# Patient Record
Sex: Female | Born: 1965
Health system: Southern US, Community
[De-identification: ages and names within clinical notes are randomized; demographics above are authoritative.]

## PROBLEM LIST (undated history)

## (undated) DIAGNOSIS — M1990 Unspecified osteoarthritis, unspecified site: Secondary | ICD-10-CM

## (undated) DIAGNOSIS — G44009 Cluster headache syndrome, unspecified, not intractable: Secondary | ICD-10-CM

## (undated) DIAGNOSIS — G4730 Sleep apnea, unspecified: Secondary | ICD-10-CM

## (undated) DIAGNOSIS — Z8719 Personal history of other diseases of the digestive system: Secondary | ICD-10-CM

## (undated) DIAGNOSIS — E119 Type 2 diabetes mellitus without complications: Secondary | ICD-10-CM

## (undated) DIAGNOSIS — M170 Bilateral primary osteoarthritis of knee: Secondary | ICD-10-CM

## (undated) DIAGNOSIS — R51 Headache: Secondary | ICD-10-CM

## (undated) DIAGNOSIS — G5603 Carpal tunnel syndrome, bilateral upper limbs: Secondary | ICD-10-CM

## (undated) DIAGNOSIS — IMO0001 Reserved for inherently not codable concepts without codable children: Secondary | ICD-10-CM

## (undated) DIAGNOSIS — F419 Anxiety disorder, unspecified: Secondary | ICD-10-CM

## (undated) DIAGNOSIS — F32A Depression, unspecified: Secondary | ICD-10-CM

## (undated) DIAGNOSIS — J45909 Unspecified asthma, uncomplicated: Secondary | ICD-10-CM

## (undated) DIAGNOSIS — Z87442 Personal history of urinary calculi: Secondary | ICD-10-CM

## (undated) DIAGNOSIS — M7540 Impingement syndrome of unspecified shoulder: Secondary | ICD-10-CM

## (undated) DIAGNOSIS — M5020 Other cervical disc displacement, unspecified cervical region: Secondary | ICD-10-CM

## (undated) DIAGNOSIS — K219 Gastro-esophageal reflux disease without esophagitis: Secondary | ICD-10-CM

## (undated) DIAGNOSIS — R600 Localized edema: Secondary | ICD-10-CM

## (undated) DIAGNOSIS — R609 Edema, unspecified: Secondary | ICD-10-CM

## (undated) DIAGNOSIS — F329 Major depressive disorder, single episode, unspecified: Secondary | ICD-10-CM

## (undated) DIAGNOSIS — N289 Disorder of kidney and ureter, unspecified: Secondary | ICD-10-CM

## (undated) DIAGNOSIS — N840 Polyp of corpus uteri: Secondary | ICD-10-CM

## (undated) DIAGNOSIS — M25819 Other specified joint disorders, unspecified shoulder: Secondary | ICD-10-CM

## (undated) DIAGNOSIS — I10 Essential (primary) hypertension: Secondary | ICD-10-CM

## (undated) DIAGNOSIS — J189 Pneumonia, unspecified organism: Secondary | ICD-10-CM

## (undated) HISTORY — PX: KNEE ARTHROSCOPY: SHX127

## (undated) HISTORY — PX: IRRIGATION AND DEBRIDEMENT KNEE: SHX5185

## (undated) HISTORY — PX: FOOT NEUROMA SURGERY: SHX646

## (undated) HISTORY — PX: TEAR DUCT PROBING: SHX793

## (undated) HISTORY — DX: Disorder of kidney and ureter, unspecified: N28.9

## (undated) HISTORY — PX: ANKLE ARTHROSCOPY: SHX545

---

## 1998-10-06 ENCOUNTER — Emergency Department (HOSPITAL_COMMUNITY): Admission: EM | Admit: 1998-10-06 | Discharge: 1998-10-06 | Payer: Self-pay | Admitting: Internal Medicine

## 1999-11-26 ENCOUNTER — Emergency Department (HOSPITAL_COMMUNITY): Admission: EM | Admit: 1999-11-26 | Discharge: 1999-11-26 | Payer: Self-pay | Admitting: Emergency Medicine

## 1999-11-26 ENCOUNTER — Encounter: Payer: Self-pay | Admitting: Emergency Medicine

## 2000-08-05 ENCOUNTER — Encounter: Payer: Self-pay | Admitting: Family Medicine

## 2000-08-05 ENCOUNTER — Ambulatory Visit (HOSPITAL_COMMUNITY): Admission: RE | Admit: 2000-08-05 | Discharge: 2000-08-05 | Payer: Self-pay | Admitting: Family Medicine

## 2002-03-03 ENCOUNTER — Emergency Department (HOSPITAL_COMMUNITY): Admission: EM | Admit: 2002-03-03 | Discharge: 2002-03-03 | Payer: Self-pay | Admitting: Emergency Medicine

## 2002-08-14 ENCOUNTER — Other Ambulatory Visit: Admission: RE | Admit: 2002-08-14 | Discharge: 2002-08-14 | Payer: Self-pay | Admitting: Obstetrics and Gynecology

## 2002-09-15 ENCOUNTER — Emergency Department (HOSPITAL_COMMUNITY): Admission: EM | Admit: 2002-09-15 | Discharge: 2002-09-15 | Payer: Self-pay | Admitting: Emergency Medicine

## 2002-09-15 ENCOUNTER — Encounter: Payer: Self-pay | Admitting: Emergency Medicine

## 2003-03-13 ENCOUNTER — Ambulatory Visit (HOSPITAL_COMMUNITY): Admission: RE | Admit: 2003-03-13 | Discharge: 2003-03-13 | Payer: Self-pay | Admitting: Family Medicine

## 2003-03-13 ENCOUNTER — Encounter: Payer: Self-pay | Admitting: Family Medicine

## 2003-07-31 ENCOUNTER — Ambulatory Visit (HOSPITAL_COMMUNITY): Admission: RE | Admit: 2003-07-31 | Discharge: 2003-07-31 | Payer: Self-pay | Admitting: Orthopedic Surgery

## 2003-08-24 ENCOUNTER — Emergency Department (HOSPITAL_COMMUNITY): Admission: EM | Admit: 2003-08-24 | Discharge: 2003-08-24 | Payer: Self-pay | Admitting: Emergency Medicine

## 2004-02-27 ENCOUNTER — Ambulatory Visit (HOSPITAL_COMMUNITY): Admission: RE | Admit: 2004-02-27 | Discharge: 2004-02-27 | Payer: Self-pay | Admitting: Family Medicine

## 2005-04-26 ENCOUNTER — Ambulatory Visit (HOSPITAL_BASED_OUTPATIENT_CLINIC_OR_DEPARTMENT_OTHER): Admission: RE | Admit: 2005-04-26 | Discharge: 2005-04-26 | Payer: Self-pay | Admitting: Family Medicine

## 2005-05-03 ENCOUNTER — Ambulatory Visit: Payer: Self-pay | Admitting: Internal Medicine

## 2005-07-13 ENCOUNTER — Ambulatory Visit (HOSPITAL_COMMUNITY): Admission: RE | Admit: 2005-07-13 | Discharge: 2005-07-13 | Payer: Self-pay | Admitting: Orthopedic Surgery

## 2005-07-13 ENCOUNTER — Encounter (INDEPENDENT_AMBULATORY_CARE_PROVIDER_SITE_OTHER): Payer: Self-pay | Admitting: Specialist

## 2005-08-25 ENCOUNTER — Ambulatory Visit: Payer: Self-pay | Admitting: Pulmonary Disease

## 2007-05-10 ENCOUNTER — Emergency Department (HOSPITAL_COMMUNITY): Admission: EM | Admit: 2007-05-10 | Discharge: 2007-05-10 | Payer: Self-pay | Admitting: Emergency Medicine

## 2007-08-16 ENCOUNTER — Ambulatory Visit (HOSPITAL_COMMUNITY): Admission: RE | Admit: 2007-08-16 | Discharge: 2007-08-16 | Payer: Self-pay | Admitting: Orthopedic Surgery

## 2007-08-31 ENCOUNTER — Inpatient Hospital Stay (HOSPITAL_COMMUNITY): Admission: AD | Admit: 2007-08-31 | Discharge: 2007-09-08 | Payer: Self-pay | Admitting: Orthopedic Surgery

## 2007-09-02 ENCOUNTER — Ambulatory Visit: Payer: Self-pay | Admitting: Infectious Diseases

## 2007-09-05 ENCOUNTER — Encounter (INDEPENDENT_AMBULATORY_CARE_PROVIDER_SITE_OTHER): Payer: Self-pay | Admitting: Orthopedic Surgery

## 2007-09-05 ENCOUNTER — Ambulatory Visit: Payer: Self-pay | Admitting: *Deleted

## 2009-01-28 ENCOUNTER — Encounter: Admission: RE | Admit: 2009-01-28 | Discharge: 2009-01-28 | Payer: Self-pay | Admitting: Family Medicine

## 2009-02-22 ENCOUNTER — Encounter: Admission: RE | Admit: 2009-02-22 | Discharge: 2009-02-22 | Payer: Self-pay | Admitting: Orthopedic Surgery

## 2010-06-23 ENCOUNTER — Ambulatory Visit: Payer: Self-pay | Admitting: Vascular Surgery

## 2010-06-24 ENCOUNTER — Ambulatory Visit: Payer: Self-pay | Admitting: Vascular Surgery

## 2011-02-24 NOTE — Procedures (Signed)
DUPLEX DEEP VENOUS EXAM - LOWER EXTREMITY   INDICATION:  Bilateral lower extremity leg pain.   HISTORY:  Edema:  Yes.  Trauma/Surgery:  No.  Pain:  Yes.  PE:  No.  Previous DVT:  No.  Anticoagulants:  No.  Other:   DUPLEX EXAM:                CFV   SFV   PopV  PTV    GSV                R  L  R  L  R  L  R   L  R  L  Thrombosis    o  o  o  o  o  o         o  o  Spontaneous   +  +  +  +  +  +         +  +  Phasic        +  +  +  +  +  +         +  +  Augmentation  +  +  +  +  +  +         +  +  Compressible  +  +  +  +  +  +         +  +  Competent     +  +  +  +  +  +         +  +   Legend:  + - yes  o - no  p - partial  D - decreased   IMPRESSION:  Bilateral lower extremity deep veins appear negative for  deep venous thrombosis; however, distal thigh and calf veins were unable  to be imaged due to patient body habitus.    _____________________________  Larina Earthly, M.D.   EM/MEDQ  D:  06/23/2010  T:  06/23/2010  Job:  [id number]

## 2011-02-24 NOTE — Op Note (Signed)
NAMESARYNA, [name]              ACCOUNT NO.:  [id number]   MEDICAL RECORD NO.:  [id number]          PATIENT TYPE:  INP   LOCATION:  1537                         FACILITY:  [facility]   PHYSICIAN:  Madlyn Frankel. Charlann Boxer, M.D.  DATE OF BIRTH:  Dec 22, 1965   DATE OF PROCEDURE:  09/05/2007  DATE OF DISCHARGE:                               OPERATIVE REPORT   PREOPERATIVE DIAGNOSES:  1. Infected left knee, status post arthroscopic irrigation and      debridement on August 31, 2007.  2. History of recent and left knee arthroscopy, complicated by      infection.   POSTOPERATIVE DIAGNOSES:  1. Infected left knee, status post arthroscopic irrigation and      debridement on August 31, 2007.  2. History of recent and left knee arthroscopy, complicated by      infection.   PROCEDURE:  Repeat arthroscopic irrigation and debridement of the left  knee.   SURGEON:  Madlyn Frankel. Charlann Boxer, M.D.   ASSISTANTS:  None.   ANESTHESIA:  General.   BLOOD LOSS:  Not applicable.   DRAINS:  x1.   COMPLICATIONS:  None.   INDICATIONS FOR PROCEDURE:  Holly [name] is a 45 year old female with history  of advanced bilaterally knee osteoarthritis with aggravated symptoms in  her left knee.  She underwent arthroscopic surgery approximately 2-1/2  weeks ago that was complicated by infection. Brought to the hospital on  August 31, 2007 for I&D.  At that time, she was noted to have a  significant amount of pus inside the knee.  Cultures at this point have  grown out MRSE.  Based on persistence and pain, there was quite a lot of  narcotic medication.  We felt that it was important for [place] to return to  the operating room for I&D with concern for recurrent infection.   Reviewed with her the risks and benefits.  She expressed her concern  about going back to the operating room.  Consent was obtained.   PROCEDURE IN DETAIL:  The patient was brought to operative theater.  Once adequate anesthesia was established, the patient  was positioned  supine.  The right leg was placed into a lithotomy leg holder.  The left  leg was put into a leg holder.  The left lower extremity prepped and  draped in sterile fashion.  Sutures removed from the inferior portal  sites.   Once arthroscopic systems were set up, the inferolateral portal was  opened and the arthroscope placed in.   I irrigated the knee with 6 liters of normal saline solution and  debrided some synovium.  Initial incision indicated a rush of either  purulent material versus necrotic fibrinous tissue.  However, given the  current situation, I have to feel that this was filled with purulence.   Following the I&D, the instrumentation was removed.  I placed a Hemovac  drain, and what I did was I basically left the two loops out to the  inferomedial and inferolateral portals and then packed the rest of the  drain inside the knee.  I then hooked this up to a single  canister.  The  portal sites were reapproximated using a 2-0 nylon.  I did leave 2-0  sutures open in it so I could close them down once I removed the drain.  Following this, the knee was dressed with Xeroform and a sterile bulky  Jones dressing.  She was brought to the recovery room in stable  condition, noting that cultures were not obtained based on the fact that  we already had MRSE grown and speciated.      Madlyn Frankel Charlann Boxer, M.D.  Electronically Signed     MDO/MEDQ  D:  09/05/2007  T:  09/06/2007  Job:  [id number]

## 2011-02-24 NOTE — Op Note (Signed)
NAMEDIVINE, IMBER              ACCOUNT NO.:  [id number]   MEDICAL RECORD NO.:  [id number]          PATIENT TYPE:  INP   LOCATION:  1537                         FACILITY:  [facility]   PHYSICIAN:  Madlyn Frankel. Charlann Boxer, M.D.  DATE OF BIRTH:  November 30, 1965   DATE OF PROCEDURE:  08/31/2007  DATE OF DISCHARGE:                               OPERATIVE REPORT   PREOPERATIVE DIAGNOSIS:  Superficial portal site infection versus deep  left knee infection, status post left knee arthroscopy performed on  August 16, 2007.   POSTOPERATIVE DIAGNOSIS:  Septic left knee.   OPERATION PERFORMED:  1. Incision and drainage of lateral portal site with skin and      subcutaneous debridement as well as pulse lavage irrigation.  2. Left knee arthroscopy with irrigation and debridement including a      tricompartmental synovectomy in addition to medial, lateral and      patellofemoral chondroplasty type debridement.  No meniscal surgery      carried out as this was previously done.   SURGEON:  Madlyn Frankel. Charlann Boxer, M.D.   ASSISTANT:  None.   ANESTHESIA:  General.   ESTIMATED BLOOD LOSS:  Minimal.   DRAINS:  x1 placed into the superomedial portal site.   INDICATIONS FOR PROCEDURE:  Holly [name] is a 45 year old morbidly obese  female with history of left knee arthroscopy on  August 16, 2007.  The  reason for that surgery was known advanced arthritis with recent flare  of pain that was unresponsive to treatment.  At the time of index  procedure, there was no obvious signs of infection and this was low my  differential diagnosis due to her presentation.  She was doing pretty  well at the first visit which was nine or 10 days out but had some  drainage early.  She was told to come back to the office if the drainage  persisted and by Wednesday she was in the office with persistent  drainage.  She did not report fevers, chills or night sweats.  She did  have this drainage, no reports of any warmth of the knee, no  large  effusions, just pain.  Upon evaluation of the knee in the office, there  was drainage.  There was no warmth of cellulitis.  This was purulent  drainage coming from the knee.  At this observation, she was admitted to  the hospital, and urgent I&D was scheduled. The risks, benefits,  discussion of treatment plan were all reviewed, consent was obtained  with hospitalization in mind.   DESCRIPTION OF PROCEDURE:  The patient was brought to the operative  theater.  Once adequate anesthesia was established, the patient was  positioned carefully on the operative table.  We did use a lithotomy  position for the right nonoperative leg making sure that all bony  prominences were padded holding the leg to the side.  The left leg was  placed in the leg holder .  The left lower extremity was prepped and  draped with DuraPrep in standard fashion.   At this point following time out to identify extremity and procedure,  the inferolateral portal was excised in elliptical fashion.  Sharp  dissection was carried down through the skin and subcutaneous fat  tissue.  There was obvious purulence at this point and I was able to  digitally penetrate the joint expressing a large amount of pus from the  joint.  Cultures were obtained at this time.  Antibiotics were initiated  with 1 g of vancomycin.   Following significant decompression of this joint, I changed gloves,  changed knife blades.  I then recreated the anteromedial and  superomedial portals.   I used pulse lavage to irrigate 3 L normal saline solution at the portal  site.  I then began the arthroscopic portion of the procedure.  Utilizing 9 L of fluid, I used arthroscopic technique with the pump and  irrigated the knee out.  Then I switched between the inferomedial and  inferolateral portals with my camera and the 4.5 Cuda shaver for  debridement purposes.  Extensive synovectomy was carried out and all  three compartments were debrided of  cartilaginous debris.  No meniscal  surgery was carried out as this was previously done.  The 9 L of fluid  was run through the knee.  Once I had done carried this out and finished  my debridement, we did take final radiographs.  The overall appearance  of the knee appeared to be very normal at the end of this portion of the  procedure.  Even on the immediate insertion of the arthroscope after  decompression of the purulence, the knee looked normal without  significant erosive changes.   Through the superomedial portal, I did place a medium Hemovac drain.  I  reapproximated the inferolateral portal using a horizontal interrupted  nylon suture followed by closure of the inferomedial portal in the same  fashion.  In the superomedial portal I did put a single horizontal  mattress suture and then placed a horizontal mattress suture that I left  loose to tie once I removed the drain.   The knee was washed clean and dried and dressed sterilely with Xeroform  and a bulky sterile wrap.  She was brought extubated to the recovery  room in stable condition.   Postoperatively she is going to be admitted to the hospital until  cultures return.  We will follow her and keep her on vancomycin.   Plan on at least a four-week course of antibiotics.  Will consult  infectious disease for their recommendations for duration of treatment  and antibiotic choice.      Madlyn Frankel Charlann Boxer, M.D.  Electronically Signed     MDO/MEDQ  D:  08/31/2007  T:  09/01/2007  Job:  [id number]

## 2011-02-24 NOTE — H&P (Signed)
NAMEGLORIANNE, [name]              ACCOUNT NO.:  [id number]   MEDICAL RECORD NO.:  [id number]         PATIENT TYPE:  LINP   LOCATION:                               FACILITY:  [facility]   PHYSICIAN:  Madlyn Frankel. Charlann Boxer, M.D.  DATE OF BIRTH:  1965-12-08   DATE OF ADMISSION:  08/31/2007  DATE OF DISCHARGE:                              HISTORY & PHYSICAL   REASON FOR ADMISSION:  Left knee wound drainage.   CHIEF COMPLAINT:  Left knee pain.   HISTORY OF PRESENT ILLNESS:  A 45 year old female with a history of left  knee arthroscopic debridement in the past 3 weeks with development of  subsequent wound drainage.  She reports no increased red skin, no  streaking.  No fevers or chills.  No further injury after surgery.   PAST MEDICAL HISTORY:  1. Osteoarthritis.  2. Migraines.  3. Reflux disease.   PAST SURGICAL HISTORY:  1. Arthroscopy in September 1998.  2. Tendon in right foot in June 2005.  3. Tear duct bypass in November 2005.  4. Morton neuroma removal in October 2006.  5. Arthroscopy, November 2008, as mentioned in history of present      illness.   FAMILY HISTORY:  Heart disease, diabetes, cancer, arthritis.   SOCIAL HISTORY:  Lives at home with parents.   DRUG ALLERGIES:  NO KNOWN DRUG ALLERGIES.   MEDICATIONS:  Cyclobenzaprine, meloxicam, Nexium, Xyzal, Vesicare,  tizanidine, Provigil.   REVIEW OF SYSTEMS:  See HPI.   PHYSICAL EXAMINATION:  VITAL SIGNS:  Pulse 76, respirations 18, blood  pressure 126/84.  GENERAL:  Awake, alert and oriented, well-developed, well-nourished, no  acute distress.  NECK:  Supple.  No carotid bruits.  CHEST/LUNGS:  Clear to auscultation bilaterally.  BREASTS:  Deferred.  HEART:  Regular rate and rhythm without gallops, clicks, rubs or  murmurs.  ABDOMEN:  Soft, nontender, nondistended.  Bowel sounds present.  GENITOURINARY:  Deferred.  EXTREMITIES:  Left knee wound with blisters and drainage from the  anterolateral portal.  No cellulitis  noted.  SKIN:  Blisters noted from possible benzoin reaction.  As noted, no  cellulitis.  NEUROLOGIC:  She has intact distal sensibilities.   LABORATORY DATA:  Labs pending admission including CBC, CMP, sed rate,  CRP and UA.   IMPRESSION:  Left knee wound drainage status post arthroscopy.   PLAN OF ACTION:  Left knee washout with possible arthroscopy.  Risks and  complications were discussed with Dr. Charlann Boxer.  Course of stay should be  several days for IV antibiotic treatment.     ______________________________  Yetta Glassman. [address], [place]      Madlyn Frankel. Charlann Boxer, M.D.  Electronically Signed    BLM/MEDQ  D:  09/01/2007  T:  09/02/2007  Job:  [id number]

## 2011-02-24 NOTE — Consult Note (Signed)
NEW PATIENT CONSULTATION   Holly [name], Holly [name]  DOB:  Apr 18, 1966                                       06/24/2010  QIHKV#:[id number]   The patient presents today for evaluation of the lower extremity  swelling.  She is a morbidly obese white female who does minimal  walking.  She has swelling in both lower extremities and has been  treated with diuretic therapy.  She reports that this is difficult since  she has a difficult time walking to the bathroom.  She does have a  history of pretibial excoriation.  No history of DVT.  She does have  orthopedic issues with knee arthroscopy on the left.  She has non-  insulin diabetes.  Does have a history of obstructive sleep apnea.   SOCIAL HISTORY:  She is single with no children.  She works as a  Science writer.  She does not smoke or drink alcohol.   FAMILY HISTORY:  Significant for coronary bypass grafting in her mother;  atrial fibrillation and congestive heart failure in her father.   REVIEW OF SYSTEMS:  Weight gain weighing at least 450 pounds.  She is 5  foot 7 inches tall.  CARDIAC:  Shortness breath on exertion.  GI:  Reflux.  No hernia or diarrhea.  NEUROLOGIC:  Dizziness.  PULMONARY:  Productive cough, wheezing.  HEMATOLOGY:  Negative.  URINARY:  Frequency of urination.  ENT:  Positive for change in eyesight.  MUSCULOSKELETAL:  Positive for arthritis, joint pain, muscle pain.  PSYCHIATRIC:  Positive for anxiety.   PHYSICAL EXAM:  Morbidly obese white female in no acute distress in a  wheelchair.  Blood pressure is 129/80, pulse 84, respirations 18.  She  is in no acute distress.  HEENT is normal.  Her radial pulses are 2+.  She does have palpable posterior tibial pulses bilaterally.  Abdomen:  Morbidly obese.  Musculoskeletal:  No major deformity or cyanosis.  Neurologic:  No focal weakness or paresthesias.  Skin:  She does have  healed pretibial excoriations bilaterally.   She underwent noninvasive vascular  laboratory studies in our office on  June 23, 2010.  This reveals no evidence of DVT and there is no  evidence of valvular incompetence.  I have spoken with the patient and  explained that I do not see any vascular cause for her lower extremity  pain or swelling.  This appears to be related to obesity and fluid  overload.  She reports that she is also having some increased shortness  of breath.  I will discuss this with Dr. Tiburcio Pea.  She is asking for [place]  to fill out forms regarding her ability to mobilize.  I have agreed to  do this but explained that I do not see any vascular pathology, either  arterial or venous, to explain any of her disability.  She will see [place]  on an as-needed basis.     Larina Earthly, M.D.  Electronically Signed   TFE/MEDQ  D:  06/24/2010  T:  06/25/2010  Job:  [id number]   cc:   [name], M.D.

## 2011-02-24 NOTE — Op Note (Signed)
NAMELEONETTA, Holly [name] [name]              ACCOUNT NO.:  [id number]   MEDICAL RECORD NO.:  [id number]          PATIENT TYPE:  AMB   LOCATION:  DAY                          FACILITY:  [facility]   PHYSICIAN:  Madlyn Frankel. Charlann Boxer, M.D.  DATE OF BIRTH:  July 06, 1966   DATE OF PROCEDURE:  08/16/2007  DATE OF DISCHARGE:  08/16/2007                               OPERATIVE REPORT   PREOPERATIVE DIAGNOSIS:  Left knee internal derangement with known  radiographic changes for osteoarthritis in a 45 year old overweight  female.   POSTOPERATIVE DIAGNOSES/FINDINGS:  1. Extensive degenerative changes noted with grade 3-4 changes noted      in the medial, lateral and patellofemoral joint.  2. Medial and lateral meniscal tears.  3. Loose bodies.  4. Synovitis.   PROCEDURES:  1. Diagnostic and operative left knee arthroscopy with medial, lateral      and patellofemoral chondroplasty.  2. Medial and lateral meniscectomy.  3. Removal of loose bodies.  4. Synovectomy.   SURGEON:  Madlyn Frankel. Charlann Boxer, M.D.   ASSISTANT:  Yetta Glassman. [address], [place], I utilized my physician assistant in  this case due to the size of her extremity.  He was important for  retracting the lower extremity to allow for visualization due to the  size of the extremity.   COMPLICATIONS:  None.   INDICATIONS FOR THE PROCEDURE:  Holly [name] is a 45 year old female whom I  have been following now the last 1-1/2 years for some degenerative  changes in the knee.  Unfortunately, the most recent round of injections  failed to provide relief with mechanical complaints and the knee wanted  to give out and indeed become painful.  Based on the failure to respond  to these measures, we discussed surgical intervention with the hopes to  debride the knee back to stable level.  She understood the risks and  knows the long-term plan involving weight loss before anything would  ever be considered regarding knee replacement surgery.  Consent was  obtained.   PROCEDURE  IN DETAIL:  The patient was brought to the operative theater.  Once adequate anesthesia, preoperative antibiotics, 2 g of Ancef were  administered, the patient was positioned supine.  The left leg was  positioned into a legholder and taped into position to hold it due to  the size of the extremity.  The left lower extremity was then prepped  and draped in sterile fashion following DuraPrep.  Standard  inferolateral, inferomedial, superomedial portals were utilized.  Diagnostic evaluation of the knee revealed the above-noted findings.  The inferomedial portal was utilized as my working portal.  I inserted a  3.5 Kuda shaver for initial debridement to allow for visualization.  There was noted to be extensive grade 3 and 4 changes noted throughout  the medial femoral and tibial surfaces.  The tibial surface was noted to  be soft and I limited debridement here.  She was noted to have meniscal  pathology in this compartment with horizontal type cleavage tears.  No  loose bodies were seen in this compartment.  She was noted to have more  extensive grade 3  to 4 changes along the medial femoral condyle.  Anteriorly an extensive synovectomy was carried out, further exposing  this area, revealing the grade 4 changes to the femoral trochlear area  with corresponding grade 3 lesions on the patella.  I did use a K-wire  at this point to create microfracture holes into an area about the size  of a nickel onto the trochlear surface in an effort to try to stimulate  some bone ingrowth.  In the patellofemoral compartment I did do  microfracture technique in the femoral trochlea to try to stimulate  fibrocartilage.   Following the work in the anterior compartment including this  debridement, I then attended to the lateral compartment.  Here it was  noted I found the most extensive amount of arthritis with kissing grade  4 lesions on the femoral and tibial surfaces.  There were also noted to  be 2 loose  bodies in this area that  appeared to be calcified cartilage  fragments and were removed with a pituitary rongeur.  I used a  combination of biting basket and 3.5 shaver to debride the torn meniscus  in this lateral side in addition to debriding the cartilage fragments  back to stable levels.  No microfracture was carried out in this side  due the extensive degenerative changes noted.  Following this extensive  debridement, when I got to a point that I was satisfied with the amount  of debridement that had been carried out and that little else could  provide long-term predictable benefit, the instrumentation was removed.  I had reexamined the knee for any loose fragments of cartilage prior to  removing the instruments.  I reapproximated the portals using a 4-0  nylon.  I then injected the knee with a combination of 0.25% Marcaine  with epinephrine and 80 mg of Depo-Medrol.   Her knee was then draped into a sterile bulky Jones dressing.  She was  brought to the recovery room, extubated, in stable condition.      Madlyn Frankel Charlann Boxer, M.D.  Electronically Signed     MDO/MEDQ  D:  08/17/2007  T:  08/17/2007  Job:  [id number]

## 2011-02-27 NOTE — Discharge Summary (Signed)
Holly [name], Holly [name]              ACCOUNT NO.:  [id number]   MEDICAL RECORD NO.:  [id number]          PATIENT TYPE:  INP   LOCATION:  1537                         FACILITY:  [facility]   PHYSICIAN:  Madlyn Frankel. Charlann Boxer, M.D.  DATE OF BIRTH:  07-03-1966   DATE OF ADMISSION:  08/31/2007  DATE OF DISCHARGE:  09/08/2007                               DISCHARGE SUMMARY   ADMISSION DIAGNOSES:  1. Left knee infection.  2. Osteoarthritis.  3. Gastroesophageal reflux disease.  4. Migraines.  5. Reflux disease.  6. Sleep apnea.   DISCHARGE DIAGNOSES:  1. Left knee infection.  2. Osteoarthritis.  3. Gastroesophageal reflux disease.  4. Migraines.  5. Reflux disease.  6. Sleep apnea.   BRIEF HISTORY OF PRESENT ILLNESS:  Holly [name] is a 45 year old female  with a history of left knee arthroscopic debridement in the past 3  weeks.  Had developed subsequent wound drainage.  Did have some  blistering from the Benzoin and Steri-Strips.  Reports no fevers or  chills.   CONSULTANTS:  1. Infectious disease for antibiotic recommendations.  2. Pharmacy for vancomycin trough level management.  3. Bariatric consult for weight management control.   LABS:  At admission, CBC:  Hematocrit 37.8, her white count was 14.  Prior to discharge her hematocrit 30.9, her white cell was 11.2.  her  ESR was 15, high.  Routine chemistries:  Glucose elevated at 106 at  admission, all others normal, and at discharge was 119.  Kidney function  was normal with GFR greater than 60.  Her calcium was 8.1 at discharge.  Hemoglobin A1c was 5.8.  C-reactive protein was 3.7.  Urinalysis  negative.  Culture of abscess showed few Staphylococcus species,  coagulase-negative, which was sensitive to gentamicin, levofloxacin,  rifampin, tetracycline, vancomycin.  Anaerobic cultures:  No anaerobes  isolated.   RADIOLOGY:  Chest portable one-view performed showing a right PICC and  low SVC and no pneumothorax.   HOSPITAL COURSE:   The patient was admitted to the hospital on the 19 and  underwent I&D of her left knee with arthroscopic debridement,  synovectomy, and I&D of the lateral portal site.  Admitted to ortho  staff.  Had significant pain during the course of stay.  She was started  on IV vancomycin.  Cultures were negative first couple days.  Requested  an ID consult.  Very limited in her physical therapy progress due to  pain and multiple other complaints.  She did remain afebrile.  Her  culture came back positive.  Requested a PICC line placement for  extended IV antibiotics.  Pharmacy followed along measuring vancomycin  troughs throughout.  ID saw the patient on the 21st.  They recommended  continuing the vancomycin as well with the final sensitivity on  organisms and recommended antibiotics for 1 month.  Had increased pain,  and we changed her medicine over to Percocet.  Culture sensitivity came  back to MRSE.  Increased pain on the 23rd.  We changed over several pain  medicines to Dilaudid, which seemed to help.  Very slow progress with  physical therapy due to  multiple complaints.  Made very slow progress.  Continued to be very anxious and in a lot of pain.  She had a very  painful range of motion.  She was afebrile on the 24th but after Dr.  Charlann Boxer saw her, he wanted to take her back and re-wash out her left knee.  That evening on the 24th he repeated an arthroscopic I&D of the left  knee.  In addition, that day the bariatric nurse coordinator came with a  consult to talk with the patient about long-term weight management.  Her  current BMI of 64.5 with exclude her from any bariatric surgery.  The  patient appreciated the information and said she would consider what she  would do about her weight management in the future.  On the 25th she  developed some hives on her arms and back.  She did receive some  Levaquin the prior day.  She was given some Benadryl to help.  We  maintained the drain in her knee  for another day, very limited PT.  Develop very serious hives and itching, was given not only Benadryl, was  given some Solu-Cortef, which helped to relieve, as well as  discontinuing the Levaquin.  Pain did subside, so we discontinued the  PCA and continued the IV vancomycin, but on the 26th she again developed  hives and complained of some shortness of breath.  Again given some Solu-  Cortef, which helped relieve the symptoms.  ID followed up after this  rash and allergic reaction and recommended changing from vancomycin over  to Cubicin, so we switched her over to Cubicin 750 mg IV daily x3 more  weeks and we discontinued the Levaquin and vancomycin.  On the 27th we  saw the patient.  She was doing OK.  Rash was much relieved.  Left knee  was clean, dry and intact, and she was ready to be discharged to home.   DISCHARGE DISPOSITION:  Discharged home in stable and improved condition  on IV antibiotics.   DISCHARGE WOUND CARE:  Keep dry.  Follow up with Dr. Charlann Boxer for wound  check in 10-14 days.   DISCHARGE ACTIVITY:  She is weightbearing as tolerated.   DISCHARGE MEDICATIONS:  1. Cubicin 750 mg IV daily x3 weeks.  2. OxyContin 10 mg one to two q.12h. p.r.n. pain.  3. Oxycodone 5 mg one to two p.o. q.3h. p.r.n. pain.  4. Tylenol 650 mg p.o. q.6h. while in pain.  5. Robaxin 500 mg p.o. q.6h.  6. Mobic 7.5 mg p.o. daily.  7. Enteric-coated aspirin 325 mg one p.o. daily.  8. Colace 100 mg p.o. b.i.d. while on pain medications.  9. Xyzal 5 mg p.o. daily for allergies.  10.Vesicare daily.  11.Flexeril 10 mg p.o. daily.  12.Nasonex p.r.n.  13.Nexium 40 mg p.o. every other day.  14.Provigil 200 mg daily next vitamin.  15.Vitamin D daily.  16.Multivitamin daily.  17.Vitamin C 1000 mg daily.  18.Fish oil 1000 mg, hold while on aspirin.   DISCHARGE FOLLOW-UP:  Follow up with Dr. Charlann Boxer in 1 week, [phone].     ______________________________  Yetta Glassman. [address], [place]      Madlyn Frankel.  Charlann Boxer, M.D.  Electronically Signed    BLM/MEDQ  D:  09/20/2007  T:  09/20/2007  Job:  [id number]   cc:   [name], M.D.  Fax: [phone]

## 2011-02-27 NOTE — Op Note (Signed)
[name], Holly [name]              ACCOUNT NO.:  [id number]   MEDICAL RECORD NO.:  [id number]          PATIENT TYPE:  AMB   LOCATION:  SDS                          FACILITY:  [facility]   PHYSICIAN:  Leonides Grills, M.D.     DATE OF BIRTH:  1966-03-24   DATE OF PROCEDURE:  07/13/2005  DATE OF DISCHARGE:                                 OPERATIVE REPORT   PREOPERATIVE DIAGNOSIS:  Left third web space Morton's neuroma.   POSTOPERATIVE DIAGNOSIS:  Left third web space Morton's neuroma.   OPERATION PERFORMED:  Excision of left third web space Morton's neuroma.   SURGEON:  Leonides Grills, M.D.   ASSISTANT:  None.   ANESTHESIA:  General with ankle block.   ESTIMATED BLOOD LOSS:  Minimal.   SPECIMENS:  Digital neuroma.   DISPOSITION:  Stable to PAR.   INDICATIONS FOR PROCEDURE:  The patient is a 45 year old female who had left  forefoot pain in the third web space and not underneath the third and fourth  metatarsal heads with a Mulder's click.  The patient  has consented for the  above procedure.  All risks which include infection, neurovascular injury,  persistent pain, worsening pain, prolonged recovery, recurrence of the  neuroma and possible stiffness, arthritis were all explained, questions were  encouraged and answered.   DESCRIPTION OF PROCEDURE:  The patient was brought to the operating room and  placed in supine position initially.  After adequate ankle block with  sedation and general endotracheal tube anesthesia was administered as well  as Ancef 1 g IV piggyback.  Due to the obese nature of the patient, we were  unable to put a tourniquet on her leg and the left lower extremity was  prepped and draped in sterile manner.  A longitudinal incision over the  dorsal aspect of the third webspace was then made.  Dissection was carried  down through skin and hemostasis was obtained.  Dissection was carried down  through adipose tissue down to the transverse metatarsal ligament  between  the third and fourth metatarsal heads respectively.  This was incised.  The  digital nerve neuroma was delivered with plantar pressure.  The neuroma was  not impressive but it appeared that the nerve prior to transection of the  transverse metatarsal ligament had an acute angle across the ligament.  The  digital branches were incised. The common digital nerve was dissected back  into muscle in the plantar aspect of the forefoot and then cut  and sent to  pathology.  The area was explored and there was no other remnant or any  other abnormalities. There was no  bleeding as well and hemostasis was obtained throughout the procedure.  The  area was then copiously irrigated with normal saline.  The subcutaneous was  closed with 3-0 Vicryl, skin was closed with 4-0 nylon.  Sterile dressing  was applied.  Hard sole shoe was applied.  The patient was stable to the PR.      Leonides Grills, M.D.  Electronically Signed     PB/MEDQ  D:  07/13/2005  T:  07/13/2005  Job:  [id number]

## 2011-02-27 NOTE — Procedures (Signed)
NAMEGABBRIELLE, Holly [name]              ACCOUNT NO.:  [id number]   MEDICAL RECORD NO.:  [id number]          PATIENT TYPE:  OUT   LOCATION:  SLEEP CENTER                 FACILITY:  [facility]   PHYSICIAN:  Clinton D. Maple Hudson, M.D. DATE OF BIRTH:  07-18-1966   DATE OF STUDY:  04/26/2005                              NOCTURNAL POLYSOMNOGRAM   REFERRING PHYSICIAN:  Dr. Noberto Retort   DATE OF STUDY:  April 26, 2005   INDICATION FOR STUDY:  Hypersomnia with sleep apnea.  BMI 62.6.  Epworth  Sleepiness Score 8/24.  Weight 400 pounds.   SLEEP ARCHITECTURE:  Total sleep time 362 minutes with sleep efficiency 85%.  Stage I 9%, stage II 40%, stages III and IV 31%, REM 20% of total sleep  time.  Sleep latency 7 minutes, REM latency 177 minutes, awake after sleep  onset 61 minutes, arousal index 45.  No bedtime medication taken.   RESPIRATORY DATA:  Split-study protocol.  Respiratory Disturbance Index  (RDI, AHI) 50.6 obstructive events per hour indicating severe obstructive  sleep apnea/hypopnea syndrome before CPAP.  There were 108 hypopneas before  CPAP.  Events were not positional.  REM RDI 11.7.  CPAP was titrated to 9  CWP but best control was achieved at 7 CWP, RDI 2.5 per hour, using a medium  Respironics ComfortSelect Mask.   OXYGEN DATA:  Mild snoring with oxygen desaturation to a nadir of 86% before  CPAP.  After CPAP control saturation held 95-98% on room air.   CARDIAC DATA:  Normal sinus rhythm.   MOVEMENT/PARASOMNIA:  A total of 54 limb jerks were recorded of which 27  were associated with arousal or awakening for a periodic limb movement with  arousal index of 4.5 per hour which is increased.   IMPRESSION/RECOMMENDATION:  1.  Severe obstructive sleep apnea/hypopnea syndrome, Respiratory      Disturbance Index 50.6 per hour with mild snoring and oxygen      desaturation to 86%.  2.  Continuous positive airway pressure titration to 7 CWP, Respiratory      Disturbance Index 2.5 per  hour, using a      medium Respironics ComfortSelect Mask with heated humidifier.  3.  Periodic limb movement with arousal, 4.5 per hour.      Clinton D. Maple Hudson, M.D.  Diplomat    CDY/MEDQ  D:  05/03/2005 10:51:52  T:  05/03/2005 22:17:12  Job:  [id number]

## 2011-06-11 ENCOUNTER — Emergency Department (HOSPITAL_COMMUNITY)
Admission: EM | Admit: 2011-06-11 | Discharge: 2011-06-11 | Disposition: A | Discharge status: Home / Self Care | Payer: 59 | Attending: Emergency Medicine | Admitting: Emergency Medicine

## 2011-06-11 ENCOUNTER — Emergency Department (HOSPITAL_COMMUNITY): Payer: 59

## 2011-06-11 DIAGNOSIS — Z79899 Other long term (current) drug therapy: Secondary | ICD-10-CM | POA: Insufficient documentation

## 2011-06-11 DIAGNOSIS — M542 Cervicalgia: Secondary | ICD-10-CM | POA: Insufficient documentation

## 2011-06-11 DIAGNOSIS — R209 Unspecified disturbances of skin sensation: Secondary | ICD-10-CM | POA: Insufficient documentation

## 2011-06-11 DIAGNOSIS — M5412 Radiculopathy, cervical region: Secondary | ICD-10-CM | POA: Insufficient documentation

## 2011-06-11 DIAGNOSIS — R51 Headache: Secondary | ICD-10-CM | POA: Insufficient documentation

## 2011-06-11 DIAGNOSIS — K219 Gastro-esophageal reflux disease without esophagitis: Secondary | ICD-10-CM | POA: Insufficient documentation

## 2011-06-11 DIAGNOSIS — M1990 Unspecified osteoarthritis, unspecified site: Secondary | ICD-10-CM | POA: Insufficient documentation

## 2011-06-11 DIAGNOSIS — E669 Obesity, unspecified: Secondary | ICD-10-CM | POA: Insufficient documentation

## 2011-06-11 DIAGNOSIS — M79609 Pain in unspecified limb: Secondary | ICD-10-CM | POA: Insufficient documentation

## 2011-07-21 LAB — URINE MICROSCOPIC-ADD ON

## 2011-07-21 LAB — BASIC METABOLIC PANEL
BUN: 10
BUN: 6
BUN: 7
BUN: 9
CO2: 26
CO2: 27
CO2: 28
Calcium: 8.5
Calcium: 9.2
Chloride: 104
Creatinine, Ser: 0.84
GFR calc Af Amer: >60
GFR calc non Af Amer: >60
GFR calc non Af Amer: >60
Glucose, Bld: 107 — ABNORMAL HIGH
Glucose, Bld: 119 — ABNORMAL HIGH
Glucose, Bld: 134 — ABNORMAL HIGH
Potassium: 3.7
Sodium: 136
Sodium: 139

## 2011-07-21 LAB — CBC
HCT: 29.6 — ABNORMAL LOW
HCT: 30.9 — ABNORMAL LOW
HCT: 32.9 — ABNORMAL LOW
HCT: 37.8
Hemoglobin: 10.4 — ABNORMAL LOW
MCHC: 33.7
MCHC: 33.9
MCV: 79.8
Platelets: 289
Platelets: 352
RBC: 3.89
RBC: 4.11
RDW: 14.5
RDW: 15.2
WBC: 11.6 — ABNORMAL HIGH
WBC: 14 — ABNORMAL HIGH

## 2011-07-21 LAB — URINALYSIS, ROUTINE W REFLEX MICROSCOPIC
Bilirubin Urine: NEGATIVE
Glucose, UA: NEGATIVE
Nitrite: NEGATIVE
Protein, ur: NEGATIVE
Specific Gravity, Urine: 1.033 — ABNORMAL HIGH
Urobilinogen, UA: 0.2
Urobilinogen, UA: 0.2
pH: 7

## 2011-07-21 LAB — ANAEROBIC CULTURE

## 2011-07-21 LAB — CULTURE, ROUTINE-ABSCESS

## 2011-07-21 LAB — PREGNANCY, URINE: Preg Test, Ur: NEGATIVE

## 2011-07-21 LAB — HEMOGLOBIN A1C
Hgb A1c MFr Bld: 5.8
Mean Plasma Glucose: 129

## 2011-07-21 LAB — HEMOGLOBIN AND HEMATOCRIT, BLOOD: HCT: 38.3

## 2011-07-21 LAB — SEDIMENTATION RATE: Sed Rate: 50 — ABNORMAL HIGH

## 2014-10-12 DIAGNOSIS — C801 Malignant (primary) neoplasm, unspecified: Secondary | ICD-10-CM

## 2014-10-12 HISTORY — DX: Malignant (primary) neoplasm, unspecified: C80.1

## 2015-06-19 ENCOUNTER — Other Ambulatory Visit: Payer: Self-pay | Admitting: Obstetrics and Gynecology

## 2015-06-20 ENCOUNTER — Other Ambulatory Visit: Payer: Self-pay

## 2015-06-20 ENCOUNTER — Encounter (HOSPITAL_COMMUNITY)
Admission: RE | Admit: 2015-06-20 | Discharge: 2015-06-20 | Disposition: A | Discharge status: Home / Self Care | Payer: Medicare Other | Source: Ambulatory Visit | Attending: Obstetrics and Gynecology | Admitting: Obstetrics and Gynecology

## 2015-06-20 ENCOUNTER — Encounter (HOSPITAL_COMMUNITY): Payer: Self-pay

## 2015-06-20 DIAGNOSIS — R9431 Abnormal electrocardiogram [ECG] [EKG]: Secondary | ICD-10-CM | POA: Insufficient documentation

## 2015-06-20 DIAGNOSIS — Z01812 Encounter for preprocedural laboratory examination: Secondary | ICD-10-CM | POA: Insufficient documentation

## 2015-06-20 DIAGNOSIS — Z0183 Encounter for blood typing: Secondary | ICD-10-CM | POA: Diagnosis not present

## 2015-06-20 DIAGNOSIS — Z01810 Encounter for preprocedural cardiovascular examination: Secondary | ICD-10-CM | POA: Diagnosis not present

## 2015-06-20 DIAGNOSIS — N938 Other specified abnormal uterine and vaginal bleeding: Secondary | ICD-10-CM | POA: Diagnosis not present

## 2015-06-20 HISTORY — DX: Gastro-esophageal reflux disease without esophagitis: K21.9

## 2015-06-20 HISTORY — DX: Reserved for inherently not codable concepts without codable children: IMO0001

## 2015-06-20 HISTORY — DX: Personal history of other diseases of the digestive system: Z87.19

## 2015-06-20 HISTORY — DX: Sleep apnea, unspecified: G47.30

## 2015-06-20 HISTORY — DX: Depression, unspecified: F32.A

## 2015-06-20 HISTORY — DX: Bilateral primary osteoarthritis of knee: M17.0

## 2015-06-20 HISTORY — DX: Headache: R51

## 2015-06-20 HISTORY — DX: Major depressive disorder, single episode, unspecified: F32.9

## 2015-06-20 HISTORY — DX: Type 2 diabetes mellitus without complications: E11.9

## 2015-06-20 HISTORY — DX: Anxiety disorder, unspecified: F41.9

## 2015-06-20 LAB — ABO/RH: ABO/RH(D): O POS

## 2015-06-20 LAB — BASIC METABOLIC PANEL
Anion gap: 7 (ref 5–15)
BUN: 12 mg/dL (ref 6–20)
CALCIUM: 9 mg/dL (ref 8.9–10.3)
CO2: 24 mmol/L (ref 22–32)
CREATININE: 0.79 mg/dL (ref 0.44–1.00)
Chloride: 106 mmol/L (ref 101–111)
GFR calc non Af Amer: >60 mL/min (ref >60)
GLUCOSE: 135 mg/dL — AB (ref 65–99)
Potassium: 4 mmol/L (ref 3.5–5.1)
Sodium: 137 mmol/L (ref 135–145)

## 2015-06-20 LAB — CBC
HCT: 38.2 % (ref 36.0–46.0)
Hemoglobin: 12.4 g/dL (ref 12.0–15.0)
MCH: 27.9 pg (ref 26.0–34.0)
MCHC: 32.5 g/dL (ref 30.0–36.0)
MCV: 85.8 fL (ref 78.0–100.0)
PLATELETS: 234 10*3/uL (ref 150–400)
RBC: 4.45 MIL/uL (ref 3.87–5.11)
RDW: 14.5 % (ref 11.5–15.5)
WBC: 11.4 10*3/uL — ABNORMAL HIGH (ref 4.0–10.5)

## 2015-06-20 LAB — TYPE AND SCREEN
ABO/RH(D): O POS
Antibody Screen: NEGATIVE

## 2015-06-20 NOTE — Patient Instructions (Addendum)
Your procedure is scheduled on:06/25/15  Enter through the Main Entrance at :62 am  Pick up desk phone and dial [phone] and inform us of your arrival.  Please call [phone] if you have any problems the morning of surgery.  Remember: Do not eat food after midnight:Monday Clear liquids are ok until:5am if very dry mouth   You may brush your teeth the morning of surgery.  Take these meds the morning of surgery with a sip of water:usual prescribed meds - Hold Metformin for 24 hours prior to surgery  DO NOT wear jewelry, eye make-up, lipstick,body lotion, or dark fingernail polish.  (Polished toes are ok) You may wear deodorant.   Patients discharged on the day of surgery will not be allowed to drive home. Wear loose fitting, comfortable clothes for your ride home.

## 2015-06-24 MED ORDER — CEFAZOLIN SODIUM-DEXTROSE 2-3 GM-% IV SOLR
2.0000 g | INTRAVENOUS | Status: AC
  Administered 2015-06-25: 2 g via INTRAVENOUS

## 2015-06-24 NOTE — H&P (Signed)
Holly [name], BRIAN NO.:  [id number]  MEDICAL RECORD NO.:  [id number]  LOCATION:  PERIO                         FACILITY:  [facility]  PHYSICIAN:  Lovenia Kim, M.D.DATE OF BIRTH:  1966-01-05  DATE OF ADMISSION:  06/11/2015 DATE OF DISCHARGE:                             HISTORY & PHYSICAL   CHIEF COMPLAINT:  Abnormal uterine bleeding with inability to tolerate ultrasound and inability to perform office endometrial biopsy.  HISTORY OF PRESENT ILLNESS:  A 49 year old, morbidly obese, white female, G0, P0, presents for endometrial evaluation/sampling due to abnormal bleeding and high risk for an endometrial hyperplasia and/or neoplasia.  ALLERGIES:  She has allergies to Levaquin, vancomycin, and Victoza.  SOCIAL HISTORY:  She is a nonsmoker, nondrinker.  She denies domestic or physical violence.  FAMILY HISTORY:  Diabetes, heart disease, hypertension, and colon cancer.  She has a personal history of sleep apnea, diabetes, and herniated disk.  MEDICATIONS:  Multiple and include reflux medications, meloxicam, insulin, Valium as needed, Topamax, Oxybutynin, cholesterol medications, and metformin.  SURGICAL HISTORY:  She has a history of tear duct surgery in 2006, ankle surgery in 2005, and knee surgery x4.  PHYSICAL EXAMINATION:  GENERAL:  She is morbidly obese white female. VITAL SIGNS:  442 pounds. HEENT:  Normal. NECK:  Supple. LUNGS:  Clear. HEART:    Regular rate and rhythm. ABDOMEN:  Obese and nontender. PELVIC:  Difficult to perform.  Nonpalpable pelvic structures due to morbid obesity. EXTREMITIES:  There are no cords. NEUROLOGIC:  Nonfocal. SKIN:  Intact.  IMPRESSION: 1. Abnormal uterine bleeding with risk factors for endometrial     hyperplasia/neoplasia. 2. Morbid obesity.  PLAN:  Proceed with possible diagnostic hysteroscopy, possible D and C, possible endometrial biopsy.  Risks of anesthesia, infection, bleeding, injury to surrounding  organs, and possible need for repair was discussed, delayed versus immediate complications to include bowel and bladder injury.  The patient acknowledges and wishes to proceed with surgery, limitations or inability to possibly doing endometrial sampling and anesthesia discussed.  The patient acknowledges and wishes to proceed.     Lovenia Kim, M.D.     RJT/MEDQ  D:  06/24/2015  T:  06/24/2015  Job:  [id number]

## 2015-06-25 ENCOUNTER — Ambulatory Visit (HOSPITAL_COMMUNITY): Payer: Medicare Other | Admitting: Anesthesiology

## 2015-06-25 ENCOUNTER — Ambulatory Visit (HOSPITAL_COMMUNITY)
Admission: RE | Admit: 2015-06-25 | Discharge: 2015-06-25 | Disposition: A | Discharge status: Home / Self Care | Payer: Medicare Other | Source: Ambulatory Visit | Attending: Obstetrics and Gynecology | Admitting: Obstetrics and Gynecology

## 2015-06-25 ENCOUNTER — Encounter (HOSPITAL_COMMUNITY)
Admission: RE | Disposition: A | Discharge status: Home / Self Care | Payer: Self-pay | Source: Ambulatory Visit | Attending: Obstetrics and Gynecology

## 2015-06-25 ENCOUNTER — Encounter (HOSPITAL_COMMUNITY): Payer: Self-pay | Admitting: Anesthesiology

## 2015-06-25 DIAGNOSIS — Z6841 Body Mass Index (BMI) 40.0 and over, adult: Secondary | ICD-10-CM | POA: Diagnosis not present

## 2015-06-25 DIAGNOSIS — K219 Gastro-esophageal reflux disease without esophagitis: Secondary | ICD-10-CM | POA: Diagnosis not present

## 2015-06-25 DIAGNOSIS — G4730 Sleep apnea, unspecified: Secondary | ICD-10-CM | POA: Diagnosis not present

## 2015-06-25 DIAGNOSIS — N938 Other specified abnormal uterine and vaginal bleeding: Secondary | ICD-10-CM | POA: Diagnosis not present

## 2015-06-25 DIAGNOSIS — N840 Polyp of corpus uteri: Secondary | ICD-10-CM | POA: Insufficient documentation

## 2015-06-25 DIAGNOSIS — E119 Type 2 diabetes mellitus without complications: Secondary | ICD-10-CM | POA: Diagnosis not present

## 2015-06-25 DIAGNOSIS — N939 Abnormal uterine and vaginal bleeding, unspecified: Secondary | ICD-10-CM | POA: Diagnosis present

## 2015-06-25 HISTORY — PX: HYSTEROSCOPY W/D&C: SHX1775

## 2015-06-25 LAB — GLUCOSE, CAPILLARY
Glucose-Capillary: 141 mg/dL — ABNORMAL HIGH (ref 65–99)
Glucose-Capillary: 122 mg/dL — ABNORMAL HIGH (ref 65–99)

## 2015-06-25 SURGERY — DILATATION AND CURETTAGE /HYSTEROSCOPY
Anesthesia: General | Site: Uterus

## 2015-06-25 MED ORDER — HYDROCODONE-IBUPROFEN 7.5-200 MG PO TABS
1.0000 | ORAL_TABLET | Freq: Three times a day (TID) | ORAL | Status: DC | PRN
Start: 2015-06-25 — End: 2020-01-20

## 2015-06-25 MED ORDER — LACTATED RINGERS IV SOLN
INTRAVENOUS | Status: DC
  Administered 2015-06-25 (×2): via INTRAVENOUS

## 2015-06-25 MED ORDER — SODIUM CHLORIDE 0.9 % IJ SOLN
INTRAMUSCULAR | Status: AC
  Filled 2015-06-25: qty 50

## 2015-06-25 MED ORDER — SCOPOLAMINE 1 MG/3DAYS TD PT72
1.0000 | MEDICATED_PATCH | Freq: Once | TRANSDERMAL | Status: DC
Start: 2015-06-25 — End: 2015-06-25
  Administered 2015-06-25: 1.5 mg via TRANSDERMAL

## 2015-06-25 MED ORDER — MIDAZOLAM HCL 2 MG/2ML IJ SOLN
INTRAMUSCULAR | Status: DC | PRN
  Administered 2015-06-25 (×2): 1 mg via INTRAVENOUS

## 2015-06-25 MED ORDER — PROPOFOL 10 MG/ML IV BOLUS
INTRAVENOUS | Status: AC
  Filled 2015-06-25: qty 20

## 2015-06-25 MED ORDER — PROPOFOL 10 MG/ML IV BOLUS
INTRAVENOUS | Status: DC | PRN
  Administered 2015-06-25: 150 mg via INTRAVENOUS

## 2015-06-25 MED ORDER — MEPERIDINE HCL 25 MG/ML IJ SOLN: 6.2500 mg | INTRAMUSCULAR | Status: DC | PRN

## 2015-06-25 MED ORDER — DEXAMETHASONE SODIUM PHOSPHATE 4 MG/ML IJ SOLN
INTRAMUSCULAR | Status: AC
  Filled 2015-06-25: qty 1

## 2015-06-25 MED ORDER — BUPIVACAINE HCL (PF) 0.25 % IJ SOLN
INTRAMUSCULAR | Status: DC | PRN
  Administered 2015-06-25: 20 mL

## 2015-06-25 MED ORDER — FENTANYL CITRATE (PF) 100 MCG/2ML IJ SOLN
INTRAMUSCULAR | Status: AC
  Filled 2015-06-25: qty 4

## 2015-06-25 MED ORDER — SCOPOLAMINE 1 MG/3DAYS TD PT72
MEDICATED_PATCH | TRANSDERMAL | Status: AC
  Administered 2015-06-25: 1.5 mg via TRANSDERMAL
  Filled 2015-06-25: qty 1

## 2015-06-25 MED ORDER — MIDAZOLAM HCL 2 MG/2ML IJ SOLN
INTRAMUSCULAR | Status: AC
  Filled 2015-06-25: qty 4

## 2015-06-25 MED ORDER — CEFAZOLIN SODIUM-DEXTROSE 2-3 GM-% IV SOLR
INTRAVENOUS | Status: AC
  Filled 2015-06-25: qty 50

## 2015-06-25 MED ORDER — LIDOCAINE HCL (CARDIAC) 20 MG/ML IV SOLN
INTRAVENOUS | Status: DC | PRN
  Administered 2015-06-25: 50 mg via INTRAVENOUS

## 2015-06-25 MED ORDER — GLYCINE 1.5 % IR SOLN
Status: DC | PRN
  Administered 2015-06-25: 3000 mL

## 2015-06-25 MED ORDER — METOCLOPRAMIDE HCL 5 MG/ML IJ SOLN: 10.0000 mg | Freq: Once | INTRAMUSCULAR | Status: DC | PRN

## 2015-06-25 MED ORDER — FENTANYL CITRATE (PF) 100 MCG/2ML IJ SOLN: 25.0000 ug | INTRAMUSCULAR | Status: DC | PRN

## 2015-06-25 MED ORDER — ONDANSETRON HCL 4 MG/2ML IJ SOLN
INTRAMUSCULAR | Status: AC
  Filled 2015-06-25: qty 4

## 2015-06-25 MED ORDER — FENTANYL CITRATE (PF) 100 MCG/2ML IJ SOLN
INTRAMUSCULAR | Status: DC | PRN
  Administered 2015-06-25: 50 ug via INTRAVENOUS

## 2015-06-25 MED ORDER — ONDANSETRON HCL 4 MG/2ML IJ SOLN
INTRAMUSCULAR | Status: DC | PRN
  Administered 2015-06-25: 4 mg via INTRAVENOUS

## 2015-06-25 MED ORDER — KETOROLAC TROMETHAMINE 30 MG/ML IJ SOLN
INTRAMUSCULAR | Status: DC | PRN
  Administered 2015-06-25: 30 mg via INTRAVENOUS

## 2015-06-25 MED ORDER — BUPIVACAINE HCL (PF) 0.25 % IJ SOLN
INTRAMUSCULAR | Status: AC
  Filled 2015-06-25: qty 30

## 2015-06-25 MED ORDER — VASOPRESSIN 20 UNIT/ML IV SOLN
INTRAVENOUS | Status: AC
  Filled 2015-06-25: qty 1

## 2015-06-25 MED ORDER — DEXAMETHASONE SODIUM PHOSPHATE 10 MG/ML IJ SOLN
INTRAMUSCULAR | Status: DC | PRN
  Administered 2015-06-25: 10 mg via INTRAVENOUS

## 2015-06-25 SURGICAL SUPPLY — 13 items
CANISTER SUCT 3000ML (MISCELLANEOUS) ×3 IMPLANT
CATH ROBINSON RED A/P 16FR (CATHETERS) ×3 IMPLANT
CLOTH BEACON ORANGE TIMEOUT ST (SAFETY) ×3 IMPLANT
CONTAINER PREFILL 10% NBF 60ML (FORM) ×6 IMPLANT
GLOVE BIO SURGEON STRL SZ7.5 (GLOVE) ×3 IMPLANT
GOWN STRL REUS W/TWL LRG LVL3 (GOWN DISPOSABLE) ×9 IMPLANT
PACK VAGINAL MINOR WOMEN LF (CUSTOM PROCEDURE TRAY) ×3 IMPLANT
PAD OB MATERNITY 4.3X12.25 (PERSONAL CARE ITEMS) ×3 IMPLANT
SYR TB 1ML 25GX5/8 (SYRINGE) ×3 IMPLANT
TOWEL OR 17X24 6PK STRL BLUE (TOWEL DISPOSABLE) ×6 IMPLANT
TUBING AQUILEX INFLOW (TUBING) ×3 IMPLANT
TUBING AQUILEX OUTFLOW (TUBING) ×3 IMPLANT
WATER STERILE IRR 1000ML POUR (IV SOLUTION) ×3 IMPLANT

## 2015-06-25 NOTE — Progress Notes (Signed)
Patient ID: Holly [name], female   DOB: 08/26/1966, 49 y.o.   MRN: [id number] Patient seen and examined. Consent witnessed and signed. No changes noted. Update completed.

## 2015-06-25 NOTE — Anesthesia Procedure Notes (Signed)
Procedure Name: LMA Insertion Date/Time: 06/25/2015 9:20 AM Performed by: Bufford Spikes Pre-anesthesia Checklist: Patient identified, Patient being monitored, Emergency Drugs available, Timeout performed and Suction available Patient Re-evaluated:Patient Re-evaluated prior to inductionOxygen Delivery Method: Circle system utilized Preoxygenation: Pre-oxygenation with 100% oxygen Intubation Type: IV induction Ventilation: Mask ventilation without difficulty LMA: LMA with gastric port inserted LMA Size: 4.0 Number of attempts: 1 Airway Equipment and Method: Patient positioned with wedge pillow Placement Confirmation: positive ETCO2 and breath sounds checked- equal and bilateral Tube secured with: Tape Dental Injury: Teeth and Oropharynx as per pre-operative assessment

## 2015-06-25 NOTE — Transfer of Care (Signed)
Immediate Anesthesia Transfer of Care Note  Patient: Holly [name]  Procedure(s) Performed: Procedure(s): DILATATION AND CURETTAGE /HYSTEROSCOPY (N/A)  Patient Location: PACU  Anesthesia Type:General  Level of Consciousness: awake, alert  and oriented  Airway & Oxygen Therapy: Patient Spontanous Breathing and Patient connected to nasal cannula oxygen  Post-op Assessment: Report given to RN and Post -op Vital signs reviewed and stable  Post vital signs: Reviewed and stable  Last Vitals: There were no vitals filed for this visit.  Complications: No apparent anesthesia complications

## 2015-06-25 NOTE — Discharge Instructions (Signed)
DISCHARGE INSTRUCTIONS: HYSTEROSCOPY  The following instructions have been prepared to help you care for yourself upon your return home.  May Remove Scop patch on or before 06/28/15  May take Ibuprofen after 3:30 pm today.  May take stool softner while taking narcotic pain medication to prevent constipation.  Drink plenty of water.  Personal hygiene:  Use sanitary pads for vaginal drainage, not tampons.  Shower the day after your procedure.  NO tub baths, pools or Jacuzzis for 2-3 weeks.  Wipe front to back after using the bathroom.  Activity and limitations:  Do NOT drive or operate any equipment for 24 hours. The effects of anesthesia are still present and drowsiness may result.  Do NOT rest in bed all day.  Walking is encouraged.  Walk up and down stairs slowly.  You may resume your normal activity in one to two days or as indicated by your physician. Sexual activity: NO intercourse for at least 2 weeks after the procedure, or as indicated by your Doctor.  Diet: Eat a light meal as desired this evening. You may resume your usual diet tomorrow.  Return to Work: You may resume your work activities in one to two days or as indicated by Marine scientist.  What to expect after your surgery: Expect to have vaginal bleeding/discharge for 2-3 days and spotting for up to 10 days. It is not unusual to have soreness for up to 1-2 weeks. You may have a slight burning sensation when you urinate for the first day. Mild cramps may continue for a couple of days. You may have a regular period in 2-6 weeks.  Call your doctor for any of the following:  Excessive vaginal bleeding or clotting, saturating and changing one pad every hour.  Inability to urinate 6 hours after discharge from hospital.  Pain not relieved by pain medication.  Fever of 100.4 F or greater.  Unusual vaginal discharge or odor.  Return to office _________________Call for an appointment  ___________________ Patients signature: ______________________ Nurses signature ________________________  [facility] [phone]

## 2015-06-25 NOTE — Op Note (Signed)
06/25/2015  9:42 AM  PATIENT:  Holly [name]  49 y.o. female  PRE-OPERATIVE DIAGNOSIS:  Dysfunctional Uterine Bleeding  POST-OPERATIVE DIAGNOSIS:  Dysfunctional uterine bleeding Endometrial polyp  PROCEDURE:  Procedure(s): DILATATION AND CURETTAGE /HYSTEROSCOPY LUS endometrial polyp  SURGEON:  Surgeon(s): Brien Few, MD  ASSISTANTS: none   ANESTHESIA:   local and general  ESTIMATED BLOOD LOSS: * No blood loss amount entered *   DRAINS: none   LOCAL MEDICATIONS USED:  MARCAINE    and Amount: 20 ml  SPECIMEN:  Source of Specimen:  EMC and polyp  DISPOSITION OF SPECIMEN:  PATHOLOGY  COUNTS:  YES  DICTATION #: [id number]  PLAN OF CARE: dc home  PATIENT DISPOSITION:  PACU - hemodynamically stable.

## 2015-06-25 NOTE — Anesthesia Postprocedure Evaluation (Signed)
  Anesthesia Post-op Note  Patient: Holly [name]  Procedure(s) Performed: Procedure(s): DILATATION AND CURETTAGE /HYSTEROSCOPY (N/A)  Patient Location: PACU  Anesthesia Type:General  Level of Consciousness: awake, alert  and oriented  Airway and Oxygen Therapy: Patient Spontanous Breathing  Post-op Pain: mild  Post-op Assessment: Post-op Vital signs reviewed, Patient's Cardiovascular Status Stable, Respiratory Function Stable, Patent Airway, No signs of Nausea or vomiting and Pain level controlled              Post-op Vital Signs: Reviewed and stable  Last Vitals:  Filed Vitals:   06/25/15 1030  BP: 133/69  Pulse: 69  Temp:   Resp: 20    Complications: No apparent anesthesia complications

## 2015-06-25 NOTE — Op Note (Deleted)
Cesarean Section Procedure Note  Indications: previous uterine incision kerr x2  Elective Sterilization Omental adhesions  Pre-operative Diagnosis: 39 week 4 day pregnancy.  Post-operative Diagnosis: same  Surgeon: Lovenia Kim   Assistants: Renato Battles, CNM  Anesthesia: Local anesthesia 0.25.% bupivacaine and Spinal anesthesia  ASA Class: 2  Procedure Details  The patient was seen in the Holding Room. The risks, benefits, complications, treatment options, and expected outcomes were discussed with the patient.  The patient concurred with the proposed plan, giving informed consent. The risks of anesthesia, infection, bleeding and possible injury to other organs discussed. Injury to bowel, bladder, or ureter with possible need for repair discussed. Possible need for transfusion with secondary risks of hepatitis or HIV acquisition discussed. Post operative complications to include but not limited to DVT, PE and Pneumonia noted. The site of surgery properly noted/marked. The patient was taken to Operating Room # 9, identified as Holly [name] and the procedure verified as C-Section Delivery. A Time Out was held and the above information confirmed.  After induction of anesthesia, the patient was draped and prepped in the usual sterile manner. A Pfannenstiel incision was made and carried down through the subcutaneous tissue to the fascia. Fascial incision was made and extended transversely using Mayo scissors. The fascia was separated from the underlying rectus tissue superiorly and inferiorly. The peritoneum was identified and entered. Peritoneal incision was extended longitudinally. The utero-vesical peritoneal reflection was incised transversely and the bladder flap was bluntly freed from the lower uterine segment. A low transverse uterine incision(Kerr hysterotomy) was made. Delivered from OA presentation with vacuum assistance was a  female with Apgar scores of 9 at one minute and 9 at five  minutes. Bulb suctioning gently performed. Neonatal team in attendance.After the umbilical cord was clamped and cut cord blood was obtained for evaluation. The placenta was removed intact and appeared normal. The uterus was curetted with a dry lap pack. Good hemostasis was noted.The uterine outline, tubes and ovaries appeared normal. The uterine incision was closed with running locked sutures of 0 Monocryl x 1 layers. Bilateral modified Pomeroy tubal ligation performed. Hemostasis was observed. The parietal peritoneum was closed with a running 2-0 Monocryl suture. The fascia was then reapproximated with running sutures of 0 Monocryl. The skin was reapproximated with 3-0 monocryl after Keyes closure with 2-0 plain.  Instrument, sponge, and needle counts were correct prior the abdominal closure and at the conclusion of the case.   Findings: As noted. Omental adhesions to anterior abdominal wall . Thin LUS.   Estimated Blood Loss:  300 mL         Drains: foley                 Specimens: placenta and tubal segments                 Complications:  None; patient tolerated the procedure well.         Disposition: PACU - hemodynamically stable.         Condition: stable  Attending Attestation: I performed the procedure.

## 2015-06-25 NOTE — Anesthesia Preprocedure Evaluation (Signed)
Anesthesia Evaluation  Patient identified by MRN, date of birth, ID band Patient awake    Reviewed: Allergy & Precautions, NPO status , Patient's Chart, lab work & pertinent test results  Airway Mallampati: III  TM Distance: >3 FB Neck ROM: Full    Dental no notable dental hx. (+) Caps   Pulmonary shortness of breath, with exertion, at rest and lying, sleep apnea and Continuous Positive Airway Pressure Ventilation ,    Pulmonary exam normal breath sounds clear to auscultation       Cardiovascular negative cardio ROS Normal cardiovascular exam Rhythm:Regular Rate:Normal     Neuro/Psych  Headaches, PSYCHIATRIC DISORDERS Anxiety Depression Cluster HA    GI/Hepatic Neg liver ROS, hiatal hernia, GERD  Medicated and Controlled,  Endo/Other  diabetes, Well Controlled, Type 2, Oral Hypoglycemic Agents, Insulin DependentMorbid obesity  Renal/GU negative Renal ROS  negative genitourinary   Musculoskeletal  (+) Arthritis , Osteoarthritis,    Abdominal (+) + obese,   Peds  Hematology   Anesthesia Other Findings   Reproductive/Obstetrics AUB                             Anesthesia Physical Anesthesia Plan  ASA: III  Anesthesia Plan: General   Post-op Pain Management:    Induction: Intravenous  Airway Management Planned: LMA  Additional Equipment:   Intra-op Plan:   Post-operative Plan: Extubation in OR  Informed Consent: I have reviewed the patients History and Physical, chart, labs and discussed the procedure including the risks, benefits and alternatives for the proposed anesthesia with the patient or authorized representative who has indicated his/her understanding and acceptance.   Dental advisory given  Plan Discussed with: CRNA, Anesthesiologist and Surgeon  Anesthesia Plan Comments:         Anesthesia Quick Evaluation

## 2015-06-26 ENCOUNTER — Encounter (HOSPITAL_COMMUNITY): Payer: Self-pay | Admitting: Obstetrics and Gynecology

## 2015-06-26 NOTE — Op Note (Signed)
NAMEJOLAINE, FRYBERGER NO.:  [id number]  MEDICAL RECORD NO.:  [id number]  LOCATION:  [facility]                          FACILITY:  [facility]  PHYSICIAN:  Lovenia Kim, M.D.DATE OF BIRTH:  Jan 11, 1966  DATE OF PROCEDURE:  06/25/2015 DATE OF DISCHARGE:  06/25/2015                              OPERATIVE REPORT   PREOPERATIVE DIAGNOSIS:  Dysfunctional abnormal uterine bleeding with probable structural lesion, high risk for endometrial hyperplasia and neoplasia due to morbid obesity and diabetes.  POSTOPERATIVE DIAGNOSIS:  Endometrial polyp.  PROCEDURE:  Diagnostic hysteroscopy, D and C, removal of lower uterine segment endometrial polyp.  SURGEON:  Lovenia Kim, M.D.  ASSISTANT:  None.  ANESTHESIA:  General and local.  ESTIMATED BLOOD LOSS:  Less than 50 mL.  FLUID DEFICIT:  50 mL.  COMPLICATIONS:  None.  DRAINS:  None.  COUNTS:  Correct.  DISPOSITION:  The patient to recovery in good condition.  BRIEF OPERATIVE NOTE:  After being apprised of the risks of anesthesia, infection, bleeding, injury to surrounding organs, possible need for repair, delayed versus immediate complications to include bowel and bladder injury, possible need for repair.  The patient was brought to the operating room where she was administered general anesthetic without complications.  Prepped and draped in usual sterile fashion. Catheterized until the bladder was empty.  Exam under anesthesia was limited by morbid obesity, reveals a bulky mid positioned uterus with no adnexal masses appreciated.  At this time, the Marcaine solution was placed using a dilute Marcaine solution for the paracervical block without complication.  Cervix easily dilated up to a #21 Pratt dilator. Hysteroscope placed.  Visualization reveals some polypoid material along the posterior uterine wall and some thickened endometrium throughout the cavity.  Sharp curettage was performed in a 4-quadrant method  without complication with tissue collected.  Revisualization also with grasping forceps the polypoid area was removed as well.  Good hemostasis was noted.  All instruments were removed.  The patient tolerated the procedure well and was transferred to recovery in good condition.     Lovenia Kim, M.D.     RJT/MEDQ  D:  06/25/2015  T:  06/26/2015  Job:  [id number]

## 2016-02-03 ENCOUNTER — Ambulatory Visit: Payer: Medicare Other | Admitting: Gynecologic Oncology

## 2016-02-24 ENCOUNTER — Ambulatory Visit: Payer: Medicare Other | Attending: Gynecologic Oncology | Admitting: Gynecologic Oncology

## 2016-02-24 ENCOUNTER — Encounter: Payer: Self-pay | Admitting: Gynecologic Oncology

## 2016-02-24 VITALS — BP 154/76 | HR 82 | Temp 99.2°F | Resp 19 | Ht 66.0 in | Wt >= 6400 oz

## 2016-02-24 DIAGNOSIS — K219 Gastro-esophageal reflux disease without esophagitis: Secondary | ICD-10-CM | POA: Diagnosis not present

## 2016-02-24 DIAGNOSIS — N8500 Endometrial hyperplasia, unspecified: Secondary | ICD-10-CM | POA: Diagnosis not present

## 2016-02-24 DIAGNOSIS — M1990 Unspecified osteoarthritis, unspecified site: Secondary | ICD-10-CM | POA: Insufficient documentation

## 2016-02-24 DIAGNOSIS — K449 Diaphragmatic hernia without obstruction or gangrene: Secondary | ICD-10-CM | POA: Diagnosis not present

## 2016-02-24 DIAGNOSIS — Z888 Allergy status to other drugs, medicaments and biological substances status: Secondary | ICD-10-CM | POA: Diagnosis not present

## 2016-02-24 DIAGNOSIS — N970 Female infertility associated with anovulation: Secondary | ICD-10-CM | POA: Diagnosis not present

## 2016-02-24 DIAGNOSIS — Z6841 Body Mass Index (BMI) 40.0 and over, adult: Secondary | ICD-10-CM | POA: Diagnosis not present

## 2016-02-24 DIAGNOSIS — N939 Abnormal uterine and vaginal bleeding, unspecified: Secondary | ICD-10-CM | POA: Diagnosis not present

## 2016-02-24 DIAGNOSIS — F329 Major depressive disorder, single episode, unspecified: Secondary | ICD-10-CM | POA: Insufficient documentation

## 2016-02-24 DIAGNOSIS — Z79899 Other long term (current) drug therapy: Secondary | ICD-10-CM | POA: Diagnosis not present

## 2016-02-24 DIAGNOSIS — Z794 Long term (current) use of insulin: Secondary | ICD-10-CM | POA: Diagnosis not present

## 2016-02-24 DIAGNOSIS — Z9889 Other specified postprocedural states: Secondary | ICD-10-CM | POA: Insufficient documentation

## 2016-02-24 DIAGNOSIS — N920 Excessive and frequent menstruation with regular cycle: Secondary | ICD-10-CM | POA: Insufficient documentation

## 2016-02-24 DIAGNOSIS — F419 Anxiety disorder, unspecified: Secondary | ICD-10-CM | POA: Diagnosis not present

## 2016-02-24 DIAGNOSIS — M170 Bilateral primary osteoarthritis of knee: Secondary | ICD-10-CM | POA: Insufficient documentation

## 2016-02-24 DIAGNOSIS — E119 Type 2 diabetes mellitus without complications: Secondary | ICD-10-CM | POA: Diagnosis not present

## 2016-02-24 DIAGNOSIS — G4733 Obstructive sleep apnea (adult) (pediatric): Secondary | ICD-10-CM | POA: Diagnosis not present

## 2016-02-24 DIAGNOSIS — N8501 Benign endometrial hyperplasia: Secondary | ICD-10-CM | POA: Insufficient documentation

## 2016-02-24 NOTE — Progress Notes (Signed)
Consult Note: Gyn-Onc  Consult was requested by Dr. Ronita Hipps for the evaluation of Genevie Cheshire 50 y.o. female  CC:  Chief Complaint  Patient presents with  . New Consultation    Endometrial hyperplasia    Assessment/Plan:  Ms. SARYAH AMES  is a 50 y.o.  year old with simple endometrial hyperplasia (without atypia), abnormal uterine bleeding and extreme morbid obesity (BMI 71kg/m2).  I discussed with the patient the origins of her abnormal bleeding and explained the physiology of the endometrium/ovarian axis. I explained the effect of unopposed estrogen produced by adiposity and its proliferative effect on the endometrium and the potential to develop malignancy if regulating progestins are not administered exogenously (or endogenously by reinitiation of ovulation). I explained that she is anovulatory due to her extreme obesity and this is causing a disproportionate effect of estrogen on her endometrium.  She requires continued progesterone. This can be from either oral agents or a progestin releasing IUD. I recommend the latter as it may allow her to come of oral progestins.  However, in the meantime, she should resume taking progestin daily.   I reassured the patient that an absence of menses in the presence of continuous progestin use is safe. There is no "build up" occuring. I explained that amenorrhea in the absence of progesterone is, however, not safe, as this state can promote endometrial proliferation, hyperplasia and possibly malignancy.   We had a lengthy discussion regarding her morbid obesity and its role in causing her abnormal uterine bleeding, diabetes, OSA and osteoarthritis. We discussed strategies for weight loss and I provided her with the phone number for the [facility] bariatric clinic.  She will contact Dr Kennith Maes office to discuss placement of the IUD (either in the OR or in the office depending upon his preference).   HPI: Guneet Detorres is a  50 year old G0 who is seen in consultation at the request of Dr Ronita Hipps for simple endometrial hyperplasia without atypia and abnormal uterine bleeding in the setting of extreme morbid obesity (BMI 71kg.m2). The patient has a long-standing history of oligo menorrhagia. Approximately 10 years ago she began having spaced menses and menorrhagia proximally once a year. She was started on progesterone in 2016 and requires take 4 tablets up Provera to control her vaginal spotting. It was extremely successful in controlling her vaginal spotting however in February 2017 she began to feel anxious about not having a menstrual cycle and therefore discussed with Dr. Lovena Le on coming off the per vera. She did this and experienced an extremely heavy episode of vaginal bleeding soaking through multiple pads per hour.  The patient's had endometrial sampling in the operating room on 06/25/2015 with a D&C which revealed proliferative endometrium with focal simple hyperplasia without atypia.  The patient is likely obesity BMI of 71 kg/m. She's been obese for most of her adult life. However her weight has been gradually increasing over time. Her morbid obesity is associated with comorbidities including type 2 diabetes mellitus with poor A1c control (most recently 7%). She also has obstructive sleep apnea, severe osteoarthritis of her lower extremities, and venous insufficiency in her lower extremities. She is relatively immobile and requires a wheelchair due to her arthritis and her obesity.  She's never had abdominal surgeries. She is nulliparous. She is no significant family history for Lynch syndrome or breast ovarian cancer syndrome.   Current Meds:  Outpatient Encounter Prescriptions as of 02/24/2016  Medication Sig  . Ascorbic Acid (VITAMIN C) 1000  MG tablet Take 1,000 mg by mouth daily.  Marland Kitchen atorvastatin (LIPITOR) 10 MG tablet Take 10 mg by mouth at bedtime.  . calcium carbonate (OS-CAL) 600 MG TABS tablet Take 600 mg  by mouth 2 (two) times daily with a meal.  . cholecalciferol (VITAMIN D) 1000 UNITS tablet Take 1,000 Units by mouth 2 (two) times daily.  . citalopram (CELEXA) 40 MG tablet Take 40 mg by mouth at bedtime.  . diazepam (VALIUM) 5 MG tablet Take 5 mg by mouth at bedtime.  . ferrous sulfate (IRON SUPPLEMENT) 325 (65 FE) MG tablet Take 325 mg by mouth daily with breakfast.  . HYDROcodone-ibuprofen (VICOPROFEN) 7.5-200 MG per tablet Take 1 tablet by mouth every 8 (eight) hours as needed for moderate pain.  Marland Kitchen Hydrocortisone (0000000 EX) Apply 1 application topically 2 (two) times daily as needed (dry skin).  . insulin NPH-regular Human (NOVOLIN 70/30) (70-30) 100 UNIT/ML injection Inject 100-110 Units into the skin 2 (two) times daily. 110 units before breakfast, 100 unit before supper  . loratadine (CLARITIN) 10 MG tablet Take 10 mg by mouth daily.  . meloxicam (MOBIC) 15 MG tablet Take 15 mg by mouth daily.  . metFORMIN (GLUCOPHAGE-XR) 500 MG 24 hr tablet Take 1,000 mg by mouth 2 (two) times daily after a meal.  . norethindrone (AYGESTIN) 5 MG tablet Take 5 mg by mouth daily.  . Omega-3 Fatty Acids (FISH OIL PO) Take 1,000 mg by mouth 2 (two) times daily.  Marland Kitchen OVER THE COUNTER MEDICATION Place 1 drop into both eyes 2 (two) times daily as needed (for dryness).  Marland Kitchen oxybutynin (DITROPAN) 5 MG tablet Take 5 mg by mouth 2 (two) times daily.  . pantoprazole (PROTONIX) 40 MG tablet Take 40 mg by mouth daily.  . Probiotic Product (PROBIOTIC DAILY PO) Take 1 tablet by mouth.  . topiramate (TOPAMAX) 100 MG tablet Take 100 mg by mouth at bedtime.  . [DISCONTINUED] bisacodyl (DULCOLAX) 5 MG EC tablet Take 5 mg by mouth at bedtime.   No facility-administered encounter medications on file as of 02/24/2016.    Allergy:  Allergies  Allergen Reactions  . Levaquin [Levofloxacin] Hives and Shortness Of Breath    Pt was on levaquin & vancomycin at same time & doesn't know which one caused the reaction  .  Vancomycin Hives and Shortness Of Breath    Pt was levaquin & vancomycin at the same & doesn't know which one caused the reaction.    Donna Bernard [Liraglutide] Nausea Only  . Tape Rash    Social Hx:   Social History   Social History  . Marital Status: Single    Spouse Name: N/A  . Number of Children: N/A  . Years of Education: N/A   Occupational History  . Not on file.   Social History Main Topics  . Smoking status: Never Smoker   . Smokeless tobacco: Not on file  . Alcohol Use: No  . Drug Use: No  . Sexual Activity: Not on file   Other Topics Concern  . Not on file   Social History Narrative    Past Surgical Hx:  Past Surgical History  Procedure Laterality Date  . Knee arthroscopy      x 4   . Foot neuroma surgery    . Ankle arthroscopy      bone spurs  . Tear duct probing Right   . Hysteroscopy w/d&c N/A 06/25/2015    Procedure: DILATATION AND CURETTAGE /HYSTEROSCOPY;  Surgeon: Brien Few, MD;  Location: [facility];  Service: Gynecology;  Laterality: N/A;    Past Medical Hx:  Past Medical History  Diagnosis Date  . Depression   . Anxiety   . GERD (gastroesophageal reflux disease)   . Osteoarthritis of both knees   . Headache     cluster headaches  . Sleep apnea   . Shortness of breath dyspnea     on exertion  . Diabetes mellitus without complication ([place])     type 2  . History of hiatal hernia     Past Gynecological History:  G0, oligomenorrhea, menorrhagia  No LMP recorded.  Family Hx: History reviewed. No pertinent family history.  Review of Systems:  Constitutional  Feels well,    ENT Normal appearing ears and nares bilaterally Skin/Breast  No rash, sores, jaundice, itching, dryness Cardiovascular  No chest pain, shortness of breath, or edema  Pulmonary  No cough or wheeze.  Gastro Intestinal  No nausea, vomitting, or diarrhoea. No bright red blood per rectum, no abdominal pain, change in bowel movement, or constipation.  Genito Urinary   No frequency, urgency, dysuria, + heavy vaginal bleeding Musculo Skeletal  No myalgia, arthralgia, joint swelling or pain  Neurologic  No weakness, numbness, change in gait,  Psychology  No depression, anxiety, insomnia.   Vitals:  Blood pressure 154/76, pulse 82, temperature 99.2 F (37.3 C), temperature source Oral, resp. rate 19, height 5\' 6"  (1.676 m), weight 437 lb (198.222 kg), SpO2 97 %.  Physical Exam: WD in NAD Neck  Supple NROM, without any enlargements.  Lymph Node Survey No cervical supraclavicular or inguinal adenopathy Cardiovascular  Pulse normal rate, regularity and rhythm. S1 and S2 normal.  Lungs  Clear to auscultation bilateraly, without wheezes/crackles/rhonchi. Good air movement.  Skin  No rash/lesions/breakdown  Psychiatry  Alert and oriented to person, place, and time  Abdomen  Normoactive bowel sounds, abdomen soft, non-tender and obese with a large pannus and evidence of an umbilical hernia.  Back No CVA tenderness Genito Urinary  Vulva/vagina: Normal external female genitalia.   No lesions. No discharge or bleeding.  Bladder/urethra:  No lesions or masses, well supported bladder  Vagina: normal  Cervix: unable to visualize, but palpably normal  Uterus: difficult to discern due to body habitus  Adnexa: no discrete masses but limited exam due to body habitus. Rectal  Good tone, no masses no cul de sac nodularity.  Extremities  No bilateral cyanosis, clubbing or edema.  60 minutes of face to face counseling was spent with the patient.  Donaciano Eva, MD  02/24/2016, 1:25 PM

## 2016-02-24 NOTE — Patient Instructions (Signed)
Plan to follow up with Dr. Ronita Hipps for IUD placement.  Please call for any questions or concerns. Please call [phone] to sign up for a Bariatric Information Seminar.  Levonorgestrel intrauterine device (IUD) What is this medicine? LEVONORGESTREL IUD (LEE voe nor jes trel) is a contraceptive (birth control) device. The device is placed inside the uterus by a healthcare professional. It is used to prevent pregnancy and can also be used to treat heavy bleeding that occurs during your period. Depending on the device, it can be used for 3 to 5 years. This medicine may be used for other purposes; ask your health care provider or pharmacist if you have questions. What should I tell my health care provider before I take this medicine? They need to know if you have any of these conditions: -abnormal Pap smear -cancer of the breast, uterus, or cervix -diabetes -endometritis -genital or pelvic infection now or in the past -have more than one sexual partner or your partner has more than one partner -heart disease -history of an ectopic or tubal pregnancy -immune system problems -IUD in place -liver disease or tumor -problems with blood clots or take blood-thinners -use intravenous drugs -uterus of unusual shape -vaginal bleeding that has not been explained -an unusual or allergic reaction to levonorgestrel, other hormones, silicone, or polyethylene, medicines, foods, dyes, or preservatives -pregnant or trying to get pregnant -breast-feeding How should I use this medicine? This device is placed inside the uterus by a health care professional. Talk to your pediatrician regarding the use of this medicine in children. Special care may be needed. Overdosage: If you think you have taken too much of this medicine contact a poison control center or emergency room at once. NOTE: This medicine is only for you. Do not share this medicine with others. What if I miss a dose? This does not apply. What may  interact with this medicine? Do not take this medicine with any of the following medications: -amprenavir -bosentan -fosamprenavir This medicine may also interact with the following medications: -aprepitant -barbiturate medicines for inducing sleep or treating seizures -bexarotene -griseofulvin -medicines to treat seizures like carbamazepine, ethotoin, felbamate, oxcarbazepine, phenytoin, topiramate -modafinil -pioglitazone -rifabutin -rifampin -rifapentine -some medicines to treat HIV infection like atazanavir, indinavir, lopinavir, nelfinavir, tipranavir, ritonavir -St. John's wort -warfarin This list may not describe all possible interactions. Give your health care provider a list of all the medicines, herbs, non-prescription drugs, or dietary supplements you use. Also tell them if you smoke, drink alcohol, or use illegal drugs. Some items may interact with your medicine. What should I watch for while using this medicine? Visit your doctor or health care professional for regular check ups. See your doctor if you or your partner has sexual contact with others, becomes HIV positive, or gets a sexual transmitted disease. This product does not protect you against HIV infection (AIDS) or other sexually transmitted diseases. You can check the placement of the IUD yourself by reaching up to the top of your vagina with clean fingers to feel the threads. Do not pull on the threads. It is a good habit to check placement after each menstrual period. Call your doctor right away if you feel more of the IUD than just the threads or if you cannot feel the threads at all. The IUD may come out by itself. You may become pregnant if the device comes out. If you notice that the IUD has come out use a backup birth control method like condoms and call your health  care provider. Using tampons will not change the position of the IUD and are okay to use during your period. What side effects may I notice from  receiving this medicine? Side effects that you should report to your doctor or health care professional as soon as possible: -allergic reactions like skin rash, itching or hives, swelling of the face, lips, or tongue -fever, flu-like symptoms -genital sores -high blood pressure -no menstrual period for 6 weeks during use -pain, swelling, warmth in the leg -pelvic pain or tenderness -severe or sudden headache -signs of pregnancy -stomach cramping -sudden shortness of breath -trouble with balance, talking, or walking -unusual vaginal bleeding, discharge -yellowing of the eyes or skin Side effects that usually do not require medical attention (report to your doctor or health care professional if they continue or are bothersome): -acne -breast pain -change in sex drive or performance -changes in weight -cramping, dizziness, or faintness while the device is being inserted -headache -irregular menstrual bleeding within first 3 to 6 months of use -nausea This list may not describe all possible side effects. Call your doctor for medical advice about side effects. You may report side effects to FDA at 1-800-FDA-1088. Where should I keep my medicine? This does not apply. NOTE: This sheet is a summary. It may not cover all possible information. If you have questions about this medicine, talk to your doctor, pharmacist, or health care provider.    2016, Elsevier/Gold Standard. (2011-10-29 13:54:04)

## 2016-11-16 ENCOUNTER — Other Ambulatory Visit: Payer: Self-pay | Admitting: Family Medicine

## 2016-11-16 ENCOUNTER — Ambulatory Visit
Admission: RE | Admit: 2016-11-16 | Discharge: 2016-11-16 | Disposition: A | Discharge status: Home / Self Care | Payer: Medicare Other | Source: Ambulatory Visit | Attending: Family Medicine | Admitting: Family Medicine

## 2016-11-16 DIAGNOSIS — R059 Cough, unspecified: Secondary | ICD-10-CM

## 2016-11-16 DIAGNOSIS — R05 Cough: Secondary | ICD-10-CM

## 2016-12-24 ENCOUNTER — Ambulatory Visit
Admission: RE | Admit: 2016-12-24 | Discharge: 2016-12-24 | Disposition: A | Discharge status: Home / Self Care | Payer: Medicare Other | Source: Ambulatory Visit | Attending: Family Medicine | Admitting: Family Medicine

## 2016-12-24 ENCOUNTER — Other Ambulatory Visit: Payer: Self-pay | Admitting: Family Medicine

## 2016-12-24 DIAGNOSIS — R05 Cough: Secondary | ICD-10-CM

## 2016-12-24 DIAGNOSIS — R058 Other specified cough: Secondary | ICD-10-CM

## 2017-02-18 ENCOUNTER — Ambulatory Visit (INDEPENDENT_AMBULATORY_CARE_PROVIDER_SITE_OTHER): Payer: Medicare Other | Admitting: Internal Medicine

## 2017-02-18 ENCOUNTER — Encounter: Payer: Self-pay | Admitting: Internal Medicine

## 2017-02-18 VITALS — BP 142/80 | HR 79 | Ht 66.0 in | Wt >= 6400 oz

## 2017-02-18 DIAGNOSIS — J45991 Cough variant asthma: Secondary | ICD-10-CM | POA: Diagnosis not present

## 2017-02-18 DIAGNOSIS — Z6841 Body Mass Index (BMI) 40.0 and over, adult: Secondary | ICD-10-CM

## 2017-02-18 LAB — NITRIC OXIDE: Nitric Oxide: 10

## 2017-02-18 MED ORDER — BENZONATATE 200 MG PO CAPS: 200.0000 mg | ORAL_CAPSULE | Freq: Three times a day (TID) | ORAL | 1 refills | Status: DC | PRN

## 2017-02-18 NOTE — Assessment & Plan Note (Signed)
FENO 02/18/2017  =   10 - Spirometry 02/18/2017  FEV1 2.35 (77%)  Ratio 90   - 02/18/2017 rec max rx for gerd / tessalon)   The most common causes of chronic cough in immunocompetent adults include the following: upper airway cough syndrome (UACS), previously referred to as postnasal drip syndrome (PNDS), which is caused by variety of rhinosinus conditions; (2) asthma; (3) GERD; (4) chronic bronchitis from cigarette smoking or other inhaled environmental irritants; (5) nonasthmatic eosinophilic bronchitis; and (6) bronchiectasis.   These conditions, singly or in combination, have accounted for up to 94% of the causes of chronic cough in prospective studies.   Other conditions have constituted no >6% of the causes in prospective studies These have included bronchogenic carcinoma, chronic interstitial pneumonia, sarcoidosis, left ventricular failure, ACEI-induced cough, and aspiration from a condition associated with pharyngeal dysfunction.    Chronic cough is often simultaneously caused by more than one condition. A single cause has been found from 38 to 82% of the time, multiple causes from 18 to 62%. Multiply caused cough has been the result of three diseases up to 42% of the time.       Based on no better with inhalers/ nl feno and spirometry and worse with scents I strongly favor Upper airway cough syndrome (previously labeled PNDS) , is  so named because it's frequently impossible to sort out how much is  CR/sinusitis with freq throat clearing (which can be related to primary GERD)   vs  causing  secondary (" extra esophageal")  GERD from wide swings in gastric pressure that occur with throat clearing, often  promoting self use of mint and menthol lozenges that reduce the lower esophageal sphincter tone and exacerbate the problem further in a cyclical fashion.   These are the same pts (now being labeled as having "irritable larynx syndrome" by some cough centers) who not infrequently have a history  of having failed to tolerate ace inhibitors,  dry powder inhalers or biphosphonates or report having atypical/extraesophageal reflux symptoms that don't respond to standard doses of PPI  and are easily confused as having aecopd or asthma flares by even experienced allergists/ pulmonologists (myself included).    Of the three most common causes of  Sub-acute or recurrent or chronic cough, only one (GERD)  can actually contribute to/ trigger  the other two (asthma and post nasal drip syndrome)  and perpetuate the cylce of cough.   While not intuitively obvious, many patients with chronic low grade reflux do not cough until there is a primary insult that disturbs the protective epithelial barrier and exposes sensitive nerve endings.   This is typically viral as appears to be the case here as sister developed the same cough acutely  but can be direct physical injury such as with an endotracheal tube.   The point is that once this occurs, it is difficult to eliminate the cycle  using anything but a maximally effective acid suppression regimen at least in the short run, accompanied by an appropriate diet to address non acid GERD and control / eliminate the cough itself for at least 3 days with tessalon pearls or tramadol if this is ineffective    rec max rx for gerd/ pnds with 1st gen H1 then return in 4 weeks

## 2017-02-18 NOTE — Patient Instructions (Addendum)
For cough /tickle > Tessalon 200 mg three times a day  For drainage / throat tickle instead of xyzal  try take CHLORPHENIRAMINE  4 mg - take one every 4 hours as needed - available over the counter- may cause drowsiness so start with just a bedtime dose or two and see how you tolerate it before trying in daytime    Pantoprazole (protonix) 40 mg   Take  30-60 min before first meal of the day and Pepcid (famotidine)  20 mg one @  bedtime until return to office - this is the best way to tell whether stomach acid is contributing to your problem.   GERD (REFLUX)  is an extremely common cause of respiratory symptoms just like yours , many times with no obvious heartburn at all.    It can be treated with medication, but also with lifestyle changes including elevation of the head of your bed (ideally with 6 inch  bed blocks),  Smoking cessation, avoidance of late meals, excessive alcohol, and avoid fatty foods, chocolate, peppermint, colas, red wine, and acidic juices such as orange juice.  NO MINT OR MENTHOL PRODUCTS SO NO COUGH DROPS   USE SUGARLESS CANDY INSTEAD (Jolley ranchers or Stover's or Life Savers) or even ice chips will also do - the key is to swallow to prevent all throat clearing. NO OIL BASED VITAMINS - use powdered substitutes.     Please schedule a follow up office visit in 4 weeks, sooner if needed

## 2017-02-18 NOTE — Progress Notes (Signed)
Subjective:     Patient ID: Holly [name], female   DOB: Jan 01, 1966,    MRN: [id number]  HPI  61 yowf never smoker healthy child with progressive wt gain complicated by hbp/hyperlipidemia per Dr Buddy Duty and severe djd esp knees referred to pulmonary clinic 02/18/2017 by Dr  Shirline Frees for recurrent/ refractory cough     02/18/2017 1st [facility] office visit/ [name]   Chief Complaint  Patient presents with  . Pulmonary Consult    Referred by Dr. Shirline Frees. Pt c/o cough since Jan 2018. Cough bothers her esp when exposed to strong smells. Cough is non prod.   pattern of bad colds into chest once a year since her 20's dx as "recurrent bronchitis"  Which "resolved with abx" but always with lingering cough x months worse in winter but no need for maint rx  About the same time developed allergies = itchy sneezy runny nose >  Eval here by Dr Lorin Picket "everything" rec shots in her 20's couldn't stay on shots so used various antiistamines helped the nasal symptoms but not the cough ? Got inhalers from Dr Lorin Picket but doesn't remember why and didn't think they helped, doesn't remember names of inhalers/ stopped f/u with Dr Lorin Picket 2001 and allergy symptoms "bronchitis" better until Jan 2018 "when the bad bronchitis set back in"   sharma eval occurred in late  2017 "just for allergies"  rec singulair Harlow Ohms / flonase some better then Jan 2018 acute sore throat/ dry cough  same symptoms as sister while on protonix p breakfast Cough was congested sounding but never any significant production, no fever and worse at hs not requiring cough syrups x one week and greadually some better since the "bronchitis" was treated. Sleeps ok at 30 degrees   - her baseline  s cough med x one week but prior to that "couldn't get off it"  abx only for this flare   Strong scents make worse  Has remote h/o gerd on ppi x "long time" no overt hb  No obvious day to day or daytime variability or assoc excess/ purulent sputum or  mucus plugs or hemoptysis or cp or chest tightness, subjective wheeze or overt sinus or hb symptoms. No unusual exp hx or h/o childhood pna/ asthma or knowledge of premature birth.  Sleeping ok at 30 degrees  without nocturnal  or early am exacerbation  of respiratory  c/o's or need for noct saba. Also denies any obvious fluctuation of symptoms with weather or environmental changes or other aggravating or alleviating factors except as outlined above   Current Medications, Allergies, Complete Past Medical History, Past Surgical History, Family History, and Social History were reviewed in [employer] record.  ROS  The following are not active complaints unless bolded sore throat, dysphagia, dental problems, itching, sneezing,  nasal congestion or excess/ purulent secretions, ear ache,   fever, chills, sweats, unintended wt loss, classically pleuritic or exertional cp,  orthopnea pnd or leg swelling, presyncope, palpitations, abdominal pain, anorexia, nausea, vomiting, diarrhea  or change in bowel or bladder habits, change in stools or urine, dysuria,hematuria,  rash, arthralgias, visual complaints, headache, numbness, weakness or ataxia or problems with walking or coordination,  change in mood/affect or memory.            Review of Systems     Objective:   Physical Exam    obese c/w bound wf nad who pleasantly but repeatedly  refused to use any terms that weren't actually  medical diagnoses or ambiguous terms but  esp allergies  Bronchitis and congestion    Wt Readings from Last 3 Encounters:  02/18/17 (!) 433 lb (196.4 kg)  02/24/16 (!) 437 lb (198.2 kg)  06/25/15 (!) 451 lb (204.6 kg)    Vital signs reviewed - Note on arrival 02 sats  98% on RA      HEENT: nl dentition, turbinates bilaterally, and oropharynx. Nl external ear canals without cough reflex   NECK :  without JVD/Nodes/TM/ nl carotid upstrokes bilaterally   LUNGS: no acc muscle use,  Nl contour  chest which is clear to A and P bilaterally without cough on insp or exp maneuvers   CV:  RRR  no s3 or murmur or increase in P2, and no edema   ABD:  soft and nontender with nl inspiratory excursion in the supine position. No bruits or organomegaly appreciated, bowel sounds nl  MS:  Nl gait/ ext warm without deformities, calf tenderness, cyanosis or clubbing No obvious joint restrictions   SKIN: warm and dry without lesions    NEURO:  alert, approp, nl sensorium with  no motor or cerebellar deficits apparent.      I personally reviewed images and agree with radiology impression as follows:  CXR:   12/24/16  No evidence of acute cardiopulmonary disease.  Mild cardiomegaly and mild chronic peribronchial thickening.  Assessment:

## 2017-02-18 NOTE — Assessment & Plan Note (Signed)
Body mass index is 69.89 kg/m.  -  trending down slightly  No results found for: TSH   Contributing to gerd risk/ doe/reviewed the need and the process to achieve and maintain neg calorie balance > defer f/u primary care including intermittently monitoring thyroid status

## 2017-03-04 ENCOUNTER — Encounter: Payer: Self-pay | Admitting: Internal Medicine

## 2017-03-04 NOTE — Telephone Encounter (Signed)
I agree, the punishment should fit the crime, meaning that if better to her satisfaction fine to go back on what she usually takes and f/u here  As needed  However, needs to be aware;  The standardized cough guidelines published in Chest by Holly [name] in 2006 are still the best available and consist of a multiple step process (up to 12!) , not a single office visit,  and are intended  to address this problem logically,  with an alogrithm dependent on response to empiric treatment at  each progressive step - we only took the 1st of 12 steps, and happy to see again to try the second if she's not happy with her symtpom control.

## 2017-03-04 NOTE — Telephone Encounter (Signed)
MW please see email and advise if okay to cancel upcoming apt for 03/18/17. Thanks.

## 2017-03-18 ENCOUNTER — Ambulatory Visit: Payer: Medicare Other | Admitting: Internal Medicine

## 2019-08-02 ENCOUNTER — Other Ambulatory Visit: Payer: Self-pay | Admitting: Family Medicine

## 2019-08-02 ENCOUNTER — Ambulatory Visit
Admission: RE | Admit: 2019-08-02 | Discharge: 2019-08-02 | Disposition: A | Discharge status: Home / Self Care | Payer: Medicare Other | Source: Ambulatory Visit | Attending: Family Medicine | Admitting: Family Medicine

## 2019-08-02 ENCOUNTER — Other Ambulatory Visit: Payer: Self-pay

## 2019-08-02 DIAGNOSIS — R059 Cough, unspecified: Secondary | ICD-10-CM

## 2019-08-02 DIAGNOSIS — R05 Cough: Secondary | ICD-10-CM

## 2019-12-02 ENCOUNTER — Other Ambulatory Visit: Payer: Self-pay | Admitting: Orthopedic Surgery

## 2019-12-02 DIAGNOSIS — M545 Low back pain, unspecified: Secondary | ICD-10-CM

## 2019-12-08 ENCOUNTER — Ambulatory Visit
Admission: RE | Admit: 2019-12-08 | Discharge: 2019-12-08 | Disposition: A | Discharge status: Home / Self Care | Payer: Medicare Other | Source: Ambulatory Visit | Attending: Orthopedic Surgery | Admitting: Orthopedic Surgery

## 2019-12-08 ENCOUNTER — Other Ambulatory Visit: Payer: Self-pay

## 2019-12-08 DIAGNOSIS — M545 Low back pain, unspecified: Secondary | ICD-10-CM

## 2019-12-22 ENCOUNTER — Other Ambulatory Visit: Payer: Self-pay | Admitting: Urology

## 2020-01-09 NOTE — Patient Instructions (Addendum)
DUE TO COVID-19 ONLY ONE VISITOR IS ALLOWED TO COME WITH YOU AND STAY IN THE WAITING ROOM ONLY DURING PRE OP AND PROCEDURE DAY OF SURGERY. THE 1 VISITOR MAY VISIT WITH YOU AFTER SURGERY IN YOUR PRIVATE ROOM DURING VISITING HOURS ONLY!  YOU NEED TO HAVE A COVID 19 TEST ON: 01/12/20@ 2:35 PM , THIS TEST MUST BE DONE BEFORE SURGERY, COME  [address], [place] , [address].  ([place]) ONCE YOUR COVID TEST IS COMPLETED, PLEASE BEGIN THE QUARANTINE INSTRUCTIONS AS OUTLINED IN YOUR HANDOUT.                Holly [name]    Your procedure is scheduled on: 01/16/20   Report to [facility] Main  Entrance   Report to admitting at: 9:00  AM     Call this number if you have problems the morning of surgery [phone]    Remember: Do not eat solid food :After Midnight. Clear liquids from midnight until 8:00 am.    CLEAR LIQUID DIET   Foods Allowed                                                                     Foods Excluded  Coffee and tea, regular and decaf                             liquids that you cannot  Plain Jell-O any favor except red or purple                                           see through such as: Fruit ices (not with fruit pulp)                                     milk, soups, orange juice  Iced Popsicles                                    All solid food Carbonated beverages, regular and diet                                    Cranberry, grape and apple juices Sports drinks like Gatorade Lightly seasoned clear broth or consume(fat free) Sugar, honey syrup  Sample Menu Breakfast                                Lunch                                     Supper Cranberry juice                    Beef broth  Chicken broth Jell-O                                     Grape juice                           Apple juice Coffee or tea                        Jell-O                                      Popsicle                                     Coffee or tea                        Coffee or tea  _____________________________________________________________________     BRUSH YOUR TEETH MORNING OF SURGERY AND RINSE YOUR MOUTH OUT, NO CHEWING GUM CANDY OR MINTS.     Take these medicines the morning of surgery with A SIP OF WATER: pantoprazole,aygestin,vesicare,topomax.Use Flonase as usual.  PLEASE BRING CPAP MASK AND  TUBING ONLY. DEVICE WILL BE PROVIDED!  How to Manage Your Diabetes Before and After Surgery  Why is it important to control my blood sugar before and after surgery? . Improving blood sugar levels before and after surgery helps healing and can limit problems. . A way of improving blood sugar control is eating a healthy diet by: o  Eating less sugar and carbohydrates o  Increasing activity/exercise o  Talking with your doctor about reaching your blood sugar goals . High blood sugars (greater than 180 mg/dL) can raise your risk of infections and slow your recovery, so you will need to focus on controlling your diabetes during the weeks before surgery. . Make sure that the doctor who takes care of your diabetes knows about your planned surgery including the date and location.  How do I manage my blood sugar before surgery? . Check your blood sugar at least 4 times a day, starting 2 days before surgery, to make sure that the level is not too high or low. o Check your blood sugar the morning of your surgery when you wake up and every 2 hours until you get to the Short Stay unit. . If your blood sugar is less than 70 mg/dL, you will need to treat for low blood sugar: o Do not take insulin. o Treat a low blood sugar (less than 70 mg/dL) with  cup of clear juice (cranberry or apple), 4 glucose tablets, OR glucose gel. o Recheck blood sugar in 15 minutes after treatment (to make sure it is greater than 70 mg/dL). If your blood sugar is not greater than 70 mg/dL on recheck, call  [phone] for further instructions. . Report your blood sugar to the short stay nurse when you get to Short Stay.  . If you are admitted to the hospital after surgery: o Your blood sugar will be checked by the staff and you will probably be given insulin after surgery (instead of oral diabetes medicines) to make sure you have good blood sugar levels. o The goal for blood  sugar control after surgery is 80-180 mg/dL.   WHAT DO I DO ABOUT MY DIABETES MEDICATION?  Do not take oral diabetes medicines (pills) the morning of surgery.        THE DAY BEFORE SURGERY: take metformin as usual.  . THE NIGHT BEFORE SURGERY, take half of the usual dose of insulin NPH.       . THE MORNING OF SURGERY, DO NOT TAKE METFORMIN.   DO NOT TAKE ANY DIABETIC MEDICATIONS DAY OF YOUR SURGERY                               You may not have any metal on your body including hair pins and              piercings  Do not wear jewelry, make-up, lotions, powders or perfumes, deodorant             Do not wear nail polish on your fingernails.  Do not shave  48 hours prior to surgery.     Do not bring valuables to the hospital. [place].  Contacts, dentures or bridgework may not be worn into surgery.  Leave suitcase in the car. After surgery it may be brought to your room.     Patients discharged the day of surgery will not be allowed to drive home. IF YOU ARE HAVING SURGERY AND GOING HOME THE SAME DAY, YOU MUST HAVE AN ADULT TO DRIVE YOU HOME AND BE WITH YOU FOR 24 HOURS. YOU MAY GO HOME BY TAXI OR UBER OR ORTHERWISE, BUT AN ADULT MUST ACCOMPANY YOU HOME AND STAY WITH YOU FOR 24 HOURS.  Name and phone number of your driver:  Special Instructions: N/A              Please read over the following fact sheets you were given: _____________________________________________________________________  [facility] - Preparing for Surgery Before surgery, you can play an  important role.  Because skin is not sterile, your skin needs to be as free of germs as possible.  You can reduce the number of germs on your skin by washing with CHG (chlorahexidine gluconate) soap before surgery.  CHG is an antiseptic cleaner which kills germs and bonds with the skin to continue killing germs even after washing. Please DO NOT use if you have an allergy to CHG or antibacterial soaps.  If your skin becomes reddened/irritated stop using the CHG and inform your nurse when you arrive at Short Stay. Do not shave (including legs and underarms) for at least 48 hours prior to the first CHG shower.  You may shave your face/neck. Please follow these instructions carefully:  1.  Shower with CHG Soap the night before surgery and the  morning of Surgery.  2.  If you choose to wash your hair, wash your hair first as usual with your  normal  shampoo.  3.  After you shampoo, rinse your hair and body thoroughly to remove the  shampoo.                           4.  Use CHG as you would any other liquid soap.  You can apply chg directly  to the skin and wash  Gently with a scrungie or clean washcloth.  5.  Apply the CHG Soap to your body ONLY FROM THE NECK DOWN.   Do not use on face/ open                           Wound or open sores. Avoid contact with eyes, ears mouth and genitals (private parts).                       Wash face,  Genitals (private parts) with your normal soap.             6.  Wash thoroughly, paying special attention to the area where your surgery  will be performed.  7.  Thoroughly rinse your body with warm water from the neck down.  8.  DO NOT shower/wash with your normal soap after using and rinsing off  the CHG Soap.                9.  Pat yourself dry with a clean towel.            10.  Wear clean pajamas.            11.  Place clean sheets on your bed the night of your first shower and do not  sleep with pets. Day of Surgery : Do not apply any  lotions/deodorants the morning of surgery.  Please wear clean clothes to the hospital/surgery center.  FAILURE TO FOLLOW THESE INSTRUCTIONS MAY RESULT IN THE CANCELLATION OF YOUR SURGERY PATIENT SIGNATURE_________________________________  NURSE SIGNATURE__________________________________  ________________________________________________________________________

## 2020-01-10 ENCOUNTER — Encounter (HOSPITAL_COMMUNITY): Payer: Self-pay | Admitting: *Deleted

## 2020-01-10 ENCOUNTER — Encounter (HOSPITAL_COMMUNITY)
Admission: RE | Admit: 2020-01-10 | Discharge: 2020-01-10 | Disposition: A | Discharge status: Home / Self Care | Payer: Medicare Other | Source: Ambulatory Visit | Attending: Urology | Admitting: Urology

## 2020-01-10 ENCOUNTER — Other Ambulatory Visit: Payer: Self-pay

## 2020-01-10 DIAGNOSIS — Z01812 Encounter for preprocedural laboratory examination: Secondary | ICD-10-CM | POA: Diagnosis present

## 2020-01-10 HISTORY — DX: Pneumonia, unspecified organism: J18.9

## 2020-01-10 NOTE — Progress Notes (Signed)
PCP - Dr. Shirline Frees. Cardiologist -   Chest x-ray -  EKG -  Stress Test -  ECHO -  Cardiac Cath -   Sleep Study - yes CPAP - yes  Fasting Blood Sugar - 120's Checks Blood Sugar 2-3 times a day  Blood Thinner Instructions: Aspirin Instructions: Last Dose:  Anesthesia review:   Patient denies shortness of breath, fever, cough and chest pain at PAT appointment   Patient verbalized understanding of instructions that were given to them at the PAT appointment. Patient was also instructed that they will need to review over the PAT instructions again at home before surgery.

## 2020-01-11 ENCOUNTER — Encounter (HOSPITAL_COMMUNITY)
Admission: RE | Admit: 2020-01-11 | Discharge: 2020-01-11 | Disposition: A | Discharge status: Home / Self Care | Payer: Medicare Other | Source: Ambulatory Visit | Attending: Urology | Admitting: Urology

## 2020-01-11 DIAGNOSIS — I4891 Unspecified atrial fibrillation: Secondary | ICD-10-CM

## 2020-01-11 DIAGNOSIS — Z01812 Encounter for preprocedural laboratory examination: Secondary | ICD-10-CM | POA: Diagnosis not present

## 2020-01-11 HISTORY — DX: Unspecified atrial fibrillation: I48.91

## 2020-01-11 LAB — BASIC METABOLIC PANEL
Anion gap: 11 (ref 5–15)
BUN: 20 mg/dL (ref 6–20)
CO2: 23 mmol/L (ref 22–32)
Calcium: 9.3 mg/dL (ref 8.9–10.3)
Chloride: 105 mmol/L (ref 98–111)
Creatinine, Ser: 1.17 mg/dL — ABNORMAL HIGH (ref 0.44–1.00)
GFR calc Af Amer: >60 mL/min (ref >60)
GFR calc non Af Amer: 54 mL/min — ABNORMAL LOW (ref >60)
Glucose, Bld: 133 mg/dL — ABNORMAL HIGH (ref 70–99)
Potassium: 4.6 mmol/L (ref 3.5–5.1)
Sodium: 139 mmol/L (ref 135–145)

## 2020-01-11 LAB — CBC
HCT: 49.8 % — ABNORMAL HIGH (ref 36.0–46.0)
Hemoglobin: 15 g/dL (ref 12.0–15.0)
MCH: 26.1 pg (ref 26.0–34.0)
MCHC: 30.1 g/dL (ref 30.0–36.0)
MCV: 86.8 fL (ref 80.0–100.0)
Platelets: 289 10*3/uL (ref 150–400)
RBC: 5.74 MIL/uL — ABNORMAL HIGH (ref 3.87–5.11)
RDW: 16.3 % — ABNORMAL HIGH (ref 11.5–15.5)
WBC: 10.2 10*3/uL (ref 4.0–10.5)
nRBC: 0 % (ref 0.0–0.2)

## 2020-01-11 LAB — HEMOGLOBIN A1C
Hgb A1c MFr Bld: 7.5 % — ABNORMAL HIGH (ref 4.8–5.6)
Mean Plasma Glucose: 168.55 mg/dL

## 2020-01-11 LAB — GLUCOSE, CAPILLARY: Glucose-Capillary: 122 mg/dL — ABNORMAL HIGH (ref 70–99)

## 2020-01-12 ENCOUNTER — Other Ambulatory Visit (HOSPITAL_COMMUNITY)
Admission: RE | Admit: 2020-01-12 | Discharge: 2020-01-12 | Disposition: A | Discharge status: Home / Self Care | Payer: Medicare Other | Source: Ambulatory Visit | Attending: Urology | Admitting: Urology

## 2020-01-12 DIAGNOSIS — Z01812 Encounter for preprocedural laboratory examination: Secondary | ICD-10-CM | POA: Insufficient documentation

## 2020-01-12 DIAGNOSIS — Z20822 Contact with and (suspected) exposure to covid-19: Secondary | ICD-10-CM | POA: Insufficient documentation

## 2020-01-12 LAB — SARS CORONAVIRUS 2 (TAT 6-24 HRS): SARS Coronavirus 2: NEGATIVE

## 2020-01-15 LAB — URINE CULTURE: Culture: 10000 — AB

## 2020-01-15 NOTE — Anesthesia Preprocedure Evaluation (Addendum)
Anesthesia Evaluation    Reviewed: Allergy & Precautions, Patient's Chart, lab work & pertinent test results  History of Anesthesia Complications Negative for: history of anesthetic complications  Airway Mallampati: III  TM Distance: <3 FB Neck ROM: Full    Dental no notable dental hx.    Pulmonary asthma , sleep apnea and Continuous Positive Airway Pressure Ventilation ,    Pulmonary exam normal breath sounds clear to auscultation + decreased breath sounds      Cardiovascular negative cardio ROS Normal cardiovascular exam Rhythm:Regular Rate:Normal     Neuro/Psych  Headaches, Anxiety Depression    GI/Hepatic Neg liver ROS, hiatal hernia, GERD  Medicated and Controlled,  Endo/Other  diabetes, Type 2, Oral Hypoglycemic Agents, Insulin DependentMorbid obesity (BMI 62)  Renal/GU negative Renal ROS  negative genitourinary   Musculoskeletal  (+) Arthritis ,   Abdominal (+) + obese,   Peds  Hematology negative hematology ROS (+)   Anesthesia Other Findings Day of surgery medications reviewed with patient.  Reproductive/Obstetrics negative OB ROS                            Anesthesia Physical Anesthesia Plan  ASA: III  Anesthesia Plan: General   Post-op Pain Management:    Induction: Intravenous  PONV Risk Score and Plan: 3 and Treatment may vary due to age or medical condition, Ondansetron and Midazolam  Airway Management Planned: LMA  Additional Equipment: None  Intra-op Plan:   Post-operative Plan: Extubation in OR  Informed Consent: I have reviewed the patients History and Physical, chart, labs and discussed the procedure including the risks, benefits and alternatives for the proposed anesthesia with the patient or authorized representative who has indicated his/her understanding and acceptance.     Dental advisory given  Plan Discussed with: CRNA and Surgeon  Anesthesia  Plan Comments:        Anesthesia Quick Evaluation

## 2020-01-15 NOTE — H&P (Signed)
CC/HPI: cc: urolithiasis   12/22/19: 54 year old woman with several months of back pain found to have a 9 mm left nonobstructing renal calculus on the CT of the L-spine. Patient has significant osteoarthritis in her knees making it difficult for her to stand for more than 1 minutes at a time as well as osteoarthritis of her knees. She has difficulty lying flat and needs to sleep propped up on a pillow. She does not take any blood thinners. She has history of diabetes but no hypertension. She has never had a kidney stone before. Her hemoglobin A1c is 8.7. She rates her pain at a 9/10 however she is sitting comfortably in a wheelchair in the office. She says she can be sitting in her recliner without any pain and then after sitting at the breakfast table starts to have pain in her left back and side. No hematuria.     ALLERGIES: Dust Grass Levaquin Mold Surgical TAPE Vancomycin Victoza SOLN    MEDICATIONS: Metformin Hcl 500 mg tablet  Atorvastatin Calcium  Calcium  Citalopram Hbr 40 mg tablet  Cyanocobalamin  Hydrocodone-Acetaminophen  Insulin  Iron  Levocetirizine Dihydrochloride 5 mg tablet  Meloxicam 15 mg tablet  Montelukast Sodium 10 mg tablet  Norethindrone  Pantoprazole Sodium 40 mg tablet, delayed release  Solifenacin Succinate 10 mg tablet  Topiramate  Vitamin B12  Vitamin C  Vitamin D3     GU PSH: None   NON-GU PSH: None   GU PMH: None   NON-GU PMH: Anxiety Diabetes Type 2    FAMILY HISTORY: None   SOCIAL HISTORY: Marital Status: Single Preferred Language: English; Ethnicity: Not Hispanic Or Latino; Race: White Current Smoking Status: Patient has never smoked.   Tobacco Use Assessment Completed: Used Tobacco in last 30 days? Has never drank.  Drinks 1 caffeinated drink per day.    REVIEW OF SYSTEMS:    GU Review Female:   Patient reports frequent urination, hard to postpone urination, and get up at night to urinate. Patient denies burning /pain with  urination, leakage of urine, stream starts and stops, trouble starting your stream, have to strain to urinate, and being pregnant.  Gastrointestinal (Upper):   Patient reports nausea. Patient denies vomiting and indigestion/ heartburn.  Gastrointestinal (Lower):   Patient reports diarrhea. Patient denies constipation.  Constitutional:   Patient denies fever, night sweats, weight loss, and fatigue.  Skin:   Patient denies skin rash/ lesion and itching.  Eyes:   Patient denies blurred vision and double vision.  Ears/ Nose/ Throat:   Patient denies sore throat and sinus problems.  Hematologic/Lymphatic:   Patient denies swollen glands and easy bruising.  Cardiovascular:   Patient denies leg swelling and chest pains.  Respiratory:   Patient denies cough and shortness of breath.  Endocrine:   Patient denies excessive thirst.  Musculoskeletal:   Patient reports back pain. Patient denies joint pain.  Neurological:   Patient denies headaches and dizziness.  Psychologic:   Patient denies depression and anxiety.   Notes: left lower back pain     VITAL SIGNS:      12/22/2019 08:26 AM  Weight 389 lb / 176.45 kg  Height 66 in / 167.64 cm  BP 140/76 mmHg  Pulse 123 /min  Temperature 97.3 F / 36.2 C  BMI 62.8 kg/m   MULTI-SYSTEM PHYSICAL EXAMINATION:    Constitutional: Well-nourished. No physical deformities. Normally developed. Good grooming.  Neck: Neck symmetrical, not swollen. Normal tracheal position.  Respiratory: No labored breathing, no use  of accessory muscles.   Cardiovascular: Normal temperature  Skin: No paleness, no jaundice, no cyanosis. No lesion, no ulcer, no rash.  Neurologic / Psychiatric: Oriented to time, oriented to place, oriented to person. No depression, no anxiety, no agitation.  Gastrointestinal: No rigidity; obese abdomen.   Eyes: Normal conjunctivae. Normal eyelids.  Ears, Nose, Mouth, and Throat: Left ear no scars, no lesions, no masses. Right ear no scars, no  lesions, no masses. Nose no scars, no lesions, no masses. Normal hearing. Normal lips.  Musculoskeletal: Normal gait and station of head and neck.     PAST DATA REVIEWED:  Source Of History:  Patient  Records Review:   POC Tool  Urine Test Review:   Urinalysis  X-Ray Review: C.T. Abdomen/Pelvis: Reviewed Films. Discussed With Patient. CT L spine - shows 9 mm left non-obstructing renal calculus    PROCEDURES:          Urinalysis w/Scope - 81001 Dipstick Dipstick Cont'd Micro  Color: Yellow Bilirubin: Neg WBC/hpf: NS (Not Seen)  Appearance: Slightly Cloudy Ketones: Neg RBC/hpf: 20 - 40/hpf  Specific Gravity: 1.025 Blood: 3+ Bacteria: Few (10-25/hpf)  pH: 5.5 Protein: 2+ Cystals: Amorph Urates  Glucose: Neg Urobilinogen: 1.0 Casts: NS (Not Seen)    Nitrites: Neg Trichomonas: Not Present    Leukocyte Esterase: Neg Mucous: Not Present      Epithelial Cells: 6 - 10/hpf      Yeast: NS (Not Seen)      Sperm: Not Present    Notes:      ASSESSMENT:      ICD-10 Details  1 GU:   Renal calculus - N20.0 Left, Acute, Uncomplicated - I discussed with patient findings of the 9 mm left nonobstructing renal calculus. I explained my hesitation that this is causing her severe back pain for several months as it is nonobstructing. We did talk about the possibility of it intermittently obstructing however her symptoms do sound more musculoskeletal. For management of stone we discussed observation versus surgery. I do not feel is patient will be a good ESWL candidate due to her inability to lie flat as well as large skin to stone distance. We discussed ureteroscopy with laser lithotripsy and retrograde pyelogram as well as stent placement and patient has elected to proceed. I did counsel the patient at this may not improve her back pain and she understands. We talked about the risks and benefits of the procedure including bleeding, infection, pain, stent discomfort, need for staged procedure, inability  remove the stone, damage to surrounding structures and she has elected to proceed.   PLAN:           Document Letter(s):  Created for Patient: Clinical Summary         Notes:   cc: Minette Brine, MD

## 2020-01-15 NOTE — Progress Notes (Addendum)
Urine results routed to Dr. Claudia Desanctis for review.

## 2020-01-16 ENCOUNTER — Ambulatory Visit (HOSPITAL_COMMUNITY): Payer: Medicare Other | Admitting: Anesthesiology

## 2020-01-16 ENCOUNTER — Encounter (HOSPITAL_COMMUNITY): Payer: Self-pay | Admitting: Urology

## 2020-01-16 ENCOUNTER — Ambulatory Visit (HOSPITAL_COMMUNITY): Payer: Medicare Other | Admitting: Physician Assistant

## 2020-01-16 ENCOUNTER — Encounter (HOSPITAL_COMMUNITY)
Admission: RE | Disposition: A | Discharge status: Home / Self Care | Payer: Self-pay | Source: Home / Self Care | Attending: Internal Medicine

## 2020-01-16 ENCOUNTER — Inpatient Hospital Stay (HOSPITAL_COMMUNITY)
Admission: RE | Admit: 2020-01-16 | Discharge: 2020-01-20 | DRG: 309 | Disposition: A | Discharge status: Home / Self Care | Payer: Medicare Other | Attending: Internal Medicine | Admitting: Internal Medicine

## 2020-01-16 DIAGNOSIS — I4891 Unspecified atrial fibrillation: Secondary | ICD-10-CM | POA: Diagnosis not present

## 2020-01-16 DIAGNOSIS — E119 Type 2 diabetes mellitus without complications: Secondary | ICD-10-CM | POA: Diagnosis present

## 2020-01-16 DIAGNOSIS — G4733 Obstructive sleep apnea (adult) (pediatric): Secondary | ICD-10-CM

## 2020-01-16 DIAGNOSIS — N809 Endometriosis, unspecified: Secondary | ICD-10-CM | POA: Diagnosis present

## 2020-01-16 DIAGNOSIS — E669 Obesity, unspecified: Secondary | ICD-10-CM

## 2020-01-16 DIAGNOSIS — Z8261 Family history of arthritis: Secondary | ICD-10-CM

## 2020-01-16 DIAGNOSIS — Z801 Family history of malignant neoplasm of trachea, bronchus and lung: Secondary | ICD-10-CM

## 2020-01-16 DIAGNOSIS — Z79899 Other long term (current) drug therapy: Secondary | ICD-10-CM

## 2020-01-16 DIAGNOSIS — I420 Dilated cardiomyopathy: Secondary | ICD-10-CM

## 2020-01-16 DIAGNOSIS — Z9989 Dependence on other enabling machines and devices: Secondary | ICD-10-CM | POA: Diagnosis not present

## 2020-01-16 DIAGNOSIS — N200 Calculus of kidney: Secondary | ICD-10-CM | POA: Diagnosis present

## 2020-01-16 DIAGNOSIS — Z6841 Body Mass Index (BMI) 40.0 and over, adult: Secondary | ICD-10-CM

## 2020-01-16 DIAGNOSIS — R519 Headache, unspecified: Secondary | ICD-10-CM | POA: Diagnosis present

## 2020-01-16 DIAGNOSIS — I3139 Other pericardial effusion (noninflammatory): Secondary | ICD-10-CM

## 2020-01-16 DIAGNOSIS — E1169 Type 2 diabetes mellitus with other specified complication: Secondary | ICD-10-CM | POA: Diagnosis not present

## 2020-01-16 DIAGNOSIS — Z825 Family history of asthma and other chronic lower respiratory diseases: Secondary | ICD-10-CM

## 2020-01-16 DIAGNOSIS — I313 Pericardial effusion (noninflammatory): Secondary | ICD-10-CM | POA: Diagnosis present

## 2020-01-16 DIAGNOSIS — G8929 Other chronic pain: Secondary | ICD-10-CM | POA: Diagnosis present

## 2020-01-16 DIAGNOSIS — Z794 Long term (current) use of insulin: Secondary | ICD-10-CM

## 2020-01-16 DIAGNOSIS — F418 Other specified anxiety disorders: Secondary | ICD-10-CM | POA: Diagnosis present

## 2020-01-16 DIAGNOSIS — Z66 Do not resuscitate: Secondary | ICD-10-CM | POA: Diagnosis not present

## 2020-01-16 DIAGNOSIS — Z20822 Contact with and (suspected) exposure to covid-19: Secondary | ICD-10-CM | POA: Diagnosis present

## 2020-01-16 DIAGNOSIS — I10 Essential (primary) hypertension: Secondary | ICD-10-CM | POA: Diagnosis present

## 2020-01-16 DIAGNOSIS — J45991 Cough variant asthma: Secondary | ICD-10-CM | POA: Diagnosis present

## 2020-01-16 DIAGNOSIS — K219 Gastro-esophageal reflux disease without esophagitis: Secondary | ICD-10-CM | POA: Diagnosis present

## 2020-01-16 DIAGNOSIS — Z87442 Personal history of urinary calculi: Secondary | ICD-10-CM

## 2020-01-16 DIAGNOSIS — E785 Hyperlipidemia, unspecified: Secondary | ICD-10-CM | POA: Diagnosis present

## 2020-01-16 DIAGNOSIS — M170 Bilateral primary osteoarthritis of knee: Secondary | ICD-10-CM | POA: Diagnosis present

## 2020-01-16 LAB — HIV ANTIBODY (ROUTINE TESTING W REFLEX): HIV Screen 4th Generation wRfx: NONREACTIVE

## 2020-01-16 LAB — BASIC METABOLIC PANEL
Anion gap: 10 (ref 5–15)
BUN: 16 mg/dL (ref 6–20)
CO2: 22 mmol/L (ref 22–32)
Calcium: 9.4 mg/dL (ref 8.9–10.3)
Chloride: 109 mmol/L (ref 98–111)
Creatinine, Ser: 0.94 mg/dL (ref 0.44–1.00)
GFR calc Af Amer: >60 mL/min (ref >60)
GFR calc non Af Amer: >60 mL/min (ref >60)
Glucose, Bld: 125 mg/dL — ABNORMAL HIGH (ref 70–99)
Potassium: 4 mmol/L (ref 3.5–5.1)
Sodium: 141 mmol/L (ref 135–145)

## 2020-01-16 LAB — CBC
HCT: 46 % (ref 36.0–46.0)
Hemoglobin: 14.1 g/dL (ref 12.0–15.0)
MCH: 26 pg (ref 26.0–34.0)
MCHC: 30.7 g/dL (ref 30.0–36.0)
MCV: 84.7 fL (ref 80.0–100.0)
Platelets: 268 10*3/uL (ref 150–400)
RBC: 5.43 MIL/uL — ABNORMAL HIGH (ref 3.87–5.11)
RDW: 16.2 % — ABNORMAL HIGH (ref 11.5–15.5)
WBC: 10.8 10*3/uL — ABNORMAL HIGH (ref 4.0–10.5)
nRBC: 0 % (ref 0.0–0.2)

## 2020-01-16 LAB — GLUCOSE, CAPILLARY
Glucose-Capillary: 114 mg/dL — ABNORMAL HIGH (ref 70–99)
Glucose-Capillary: 108 mg/dL — ABNORMAL HIGH (ref 70–99)
Glucose-Capillary: 133 mg/dL — ABNORMAL HIGH (ref 70–99)
Glucose-Capillary: 158 mg/dL — ABNORMAL HIGH (ref 70–99)

## 2020-01-16 LAB — MRSA PCR SCREENING: MRSA by PCR: NEGATIVE

## 2020-01-16 LAB — TSH: TSH: 2.116 u[IU]/mL (ref 0.350–4.500)

## 2020-01-16 LAB — MAGNESIUM: Magnesium: 1.6 mg/dL — ABNORMAL LOW (ref 1.7–2.4)

## 2020-01-16 SURGERY — Surgical Case
Anesthesia: General

## 2020-01-16 MED ORDER — ASCORBIC ACID 500 MG PO TABS: 1000.0000 mg | ORAL_TABLET | Freq: Two times a day (BID) | ORAL | Status: DC

## 2020-01-16 MED ORDER — INSULIN ASPART 100 UNIT/ML ~~LOC~~ SOLN
0.0000 [IU] | Freq: Three times a day (TID) | SUBCUTANEOUS | Status: DC
  Administered 2020-01-17 (×2): 3 [IU] via SUBCUTANEOUS
  Administered 2020-01-17: 5 [IU] via SUBCUTANEOUS
  Administered 2020-01-18 (×2): 2 [IU] via SUBCUTANEOUS
  Administered 2020-01-18 – 2020-01-19 (×2): 3 [IU] via SUBCUTANEOUS
  Administered 2020-01-20: 10:00:00 2 [IU] via SUBCUTANEOUS
  Administered 2020-01-20 (×2): 3 [IU] via SUBCUTANEOUS

## 2020-01-16 MED ORDER — EPHEDRINE 5 MG/ML INJ
INTRAVENOUS | Status: AC
  Filled 2020-01-16: qty 10

## 2020-01-16 MED ORDER — FENTANYL CITRATE (PF) 100 MCG/2ML IJ SOLN: 25.0000 ug | INTRAMUSCULAR | Status: DC | PRN

## 2020-01-16 MED ORDER — VITAMIN B-12 1000 MCG PO TABS
1000.0000 ug | ORAL_TABLET | Freq: Every day | ORAL | Status: DC
  Administered 2020-01-16 – 2020-01-20 (×5): 1000 ug via ORAL
  Filled 2020-01-16 (×6): qty 1

## 2020-01-16 MED ORDER — FENTANYL CITRATE (PF) 250 MCG/5ML IJ SOLN
INTRAMUSCULAR | Status: AC
  Filled 2020-01-16: qty 5

## 2020-01-16 MED ORDER — HYDROCODONE-ACETAMINOPHEN 10-325 MG PO TABS
1.0000 | ORAL_TABLET | Freq: Four times a day (QID) | ORAL | Status: DC | PRN
  Administered 2020-01-16 – 2020-01-20 (×7): 1 via ORAL
  Filled 2020-01-16 (×8): qty 1

## 2020-01-16 MED ORDER — AMIODARONE HCL IN DEXTROSE 360-4.14 MG/200ML-% IV SOLN
60.0000 mg/h | INTRAVENOUS | Status: AC
  Administered 2020-01-16 (×2): 60 mg/h via INTRAVENOUS
  Filled 2020-01-16 (×2): qty 200

## 2020-01-16 MED ORDER — CALCIUM CARBONATE 1250 (500 CA) MG PO TABS
1.0000 | ORAL_TABLET | Freq: Two times a day (BID) | ORAL | Status: DC
  Administered 2020-01-17 – 2020-01-20 (×7): 500 mg via ORAL
  Filled 2020-01-16 (×7): qty 1

## 2020-01-16 MED ORDER — ACETAMINOPHEN 650 MG RE SUPP: 650.0000 mg | Freq: Four times a day (QID) | RECTAL | Status: DC | PRN

## 2020-01-16 MED ORDER — LACTATED RINGERS IV SOLN: INTRAVENOUS | Status: DC

## 2020-01-16 MED ORDER — FLUTICASONE PROPIONATE 50 MCG/ACT NA SUSP: 2.0000 | Freq: Every day | NASAL | Status: DC | PRN

## 2020-01-16 MED ORDER — LIDOCAINE 2% (20 MG/ML) 5 ML SYRINGE
INTRAMUSCULAR | Status: AC
  Filled 2020-01-16: qty 5

## 2020-01-16 MED ORDER — ACETAMINOPHEN 500 MG PO TABS
1000.0000 mg | ORAL_TABLET | Freq: Once | ORAL | Status: AC
  Administered 2020-01-16: 10:00:00 1000 mg via ORAL
  Filled 2020-01-16: qty 2

## 2020-01-16 MED ORDER — OXYCODONE HCL 5 MG/5ML PO SOLN: 5.0000 mg | Freq: Once | ORAL | Status: DC | PRN

## 2020-01-16 MED ORDER — CITALOPRAM HYDROBROMIDE 20 MG PO TABS
40.0000 mg | ORAL_TABLET | Freq: Every day | ORAL | Status: DC
  Administered 2020-01-16 – 2020-01-19 (×4): 40 mg via ORAL
  Filled 2020-01-16 (×4): qty 2

## 2020-01-16 MED ORDER — MONTELUKAST SODIUM 10 MG PO TABS
10.0000 mg | ORAL_TABLET | Freq: Every day | ORAL | Status: DC
  Administered 2020-01-16 – 2020-01-19 (×4): 10 mg via ORAL
  Filled 2020-01-16 (×4): qty 1

## 2020-01-16 MED ORDER — ATORVASTATIN CALCIUM 10 MG PO TABS
10.0000 mg | ORAL_TABLET | Freq: Every day | ORAL | Status: DC
  Administered 2020-01-16 – 2020-01-19 (×4): 10 mg via ORAL
  Filled 2020-01-16 (×4): qty 1

## 2020-01-16 MED ORDER — DARIFENACIN HYDROBROMIDE ER 15 MG PO TB24
15.0000 mg | ORAL_TABLET | Freq: Every day | ORAL | Status: DC
  Administered 2020-01-17 – 2020-01-20 (×4): 15 mg via ORAL
  Filled 2020-01-16 (×4): qty 1

## 2020-01-16 MED ORDER — FERROUS SULFATE 325 (65 FE) MG PO TABS
325.0000 mg | ORAL_TABLET | Freq: Every day | ORAL | Status: DC
  Administered 2020-01-17 – 2020-01-20 (×3): 325 mg via ORAL
  Filled 2020-01-16 (×3): qty 1

## 2020-01-16 MED ORDER — MIDAZOLAM HCL 2 MG/2ML IJ SOLN
INTRAMUSCULAR | Status: AC
  Filled 2020-01-16: qty 2

## 2020-01-16 MED ORDER — INSULIN GLARGINE 100 UNIT/ML ~~LOC~~ SOLN
30.0000 [IU] | Freq: Every day | SUBCUTANEOUS | Status: DC
  Administered 2020-01-16 – 2020-01-19 (×4): 30 [IU] via SUBCUTANEOUS
  Filled 2020-01-16 (×6): qty 0.3

## 2020-01-16 MED ORDER — TOPIRAMATE 25 MG PO TABS
150.0000 mg | ORAL_TABLET | Freq: Every evening | ORAL | Status: DC
  Administered 2020-01-16 – 2020-01-20 (×5): 150 mg via ORAL
  Filled 2020-01-16 (×5): qty 2

## 2020-01-16 MED ORDER — DILTIAZEM HCL-DEXTROSE 125-5 MG/125ML-% IV SOLN (PREMIX)
5.0000 mg/h | INTRAVENOUS | Status: DC
  Administered 2020-01-16: 16:00:00 10 mg/h via INTRAVENOUS
  Administered 2020-01-16: 16:00:00 5 mg/h via INTRAVENOUS
  Administered 2020-01-16: 15 mg/h via INTRAVENOUS
  Filled 2020-01-16: qty 125

## 2020-01-16 MED ORDER — CHLORHEXIDINE GLUCONATE CLOTH 2 % EX PADS
6.0000 | MEDICATED_PAD | Freq: Every day | CUTANEOUS | Status: DC
  Administered 2020-01-16 – 2020-01-18 (×3): 6 via TOPICAL

## 2020-01-16 MED ORDER — HYDROCERIN EX CREA
TOPICAL_CREAM | Freq: Two times a day (BID) | CUTANEOUS | Status: DC
  Administered 2020-01-20: 1 via TOPICAL
  Filled 2020-01-16: qty 113

## 2020-01-16 MED ORDER — AMIODARONE HCL IN DEXTROSE 360-4.14 MG/200ML-% IV SOLN
30.0000 mg/h | INTRAVENOUS | Status: DC
  Administered 2020-01-17 – 2020-01-19 (×6): 30 mg/h via INTRAVENOUS
  Filled 2020-01-16 (×5): qty 200

## 2020-01-16 MED ORDER — AMIODARONE LOAD VIA INFUSION
150.0000 mg | Freq: Once | INTRAVENOUS | Status: AC
  Administered 2020-01-16: 20:00:00 150 mg via INTRAVENOUS
  Filled 2020-01-16: qty 83.34

## 2020-01-16 MED ORDER — ESMOLOL HCL 100 MG/10ML IV SOLN
INTRAVENOUS | Status: DC | PRN
  Administered 2020-01-16 (×2): 30 mg via INTRAVENOUS

## 2020-01-16 MED ORDER — ACETAMINOPHEN 325 MG PO TABS
650.0000 mg | ORAL_TABLET | Freq: Four times a day (QID) | ORAL | Status: DC | PRN
  Administered 2020-01-17: 09:00:00 650 mg via ORAL
  Filled 2020-01-16: qty 2

## 2020-01-16 MED ORDER — PROPOFOL 10 MG/ML IV BOLUS
INTRAVENOUS | Status: AC
  Filled 2020-01-16: qty 20

## 2020-01-16 MED ORDER — VITAMIN D 25 MCG (1000 UNIT) PO TABS
1000.0000 [IU] | ORAL_TABLET | Freq: Two times a day (BID) | ORAL | Status: DC
  Administered 2020-01-16 – 2020-01-20 (×7): 1000 [IU] via ORAL
  Filled 2020-01-16 (×7): qty 1

## 2020-01-16 MED ORDER — SODIUM CHLORIDE 0.9 % IV SOLN
2.0000 g | INTRAVENOUS | Status: DC
  Filled 2020-01-16: qty 20

## 2020-01-16 MED ORDER — OXYCODONE HCL 5 MG PO TABS: 5.0000 mg | ORAL_TABLET | Freq: Once | ORAL | Status: DC | PRN

## 2020-01-16 MED ORDER — ORAL CARE MOUTH RINSE
15.0000 mL | Freq: Two times a day (BID) | OROMUCOSAL | Status: DC
  Administered 2020-01-16 – 2020-01-20 (×7): 15 mL via OROMUCOSAL

## 2020-01-16 MED ORDER — APIXABAN 5 MG PO TABS
5.0000 mg | ORAL_TABLET | Freq: Two times a day (BID) | ORAL | Status: DC
  Administered 2020-01-16 – 2020-01-20 (×10): 5 mg via ORAL
  Filled 2020-01-16 (×7): qty 1
  Filled 2020-01-16: qty 2
  Filled 2020-01-16 (×2): qty 1

## 2020-01-16 MED ORDER — INSULIN ASPART 100 UNIT/ML ~~LOC~~ SOLN: 0.0000 [IU] | Freq: Every day | SUBCUTANEOUS | Status: DC

## 2020-01-16 MED ORDER — PROMETHAZINE HCL 25 MG/ML IJ SOLN: 6.2500 mg | INTRAMUSCULAR | Status: DC | PRN

## 2020-01-16 MED ORDER — DILTIAZEM LOAD VIA INFUSION
10.0000 mg | Freq: Once | INTRAVENOUS | Status: AC
  Administered 2020-01-16: 10 mg via INTRAVENOUS
  Filled 2020-01-16: qty 10

## 2020-01-16 MED ORDER — ONDANSETRON HCL 4 MG/2ML IJ SOLN
INTRAMUSCULAR | Status: AC
  Filled 2020-01-16: qty 2

## 2020-01-16 MED ORDER — LORATADINE 10 MG PO TABS
10.0000 mg | ORAL_TABLET | Freq: Every day | ORAL | Status: DC
  Administered 2020-01-16 – 2020-01-19 (×4): 10 mg via ORAL
  Filled 2020-01-16 (×4): qty 1

## 2020-01-16 MED ORDER — NORETHINDRONE ACETATE 5 MG PO TABS
20.0000 mg | ORAL_TABLET | Freq: Every day | ORAL | Status: DC
  Administered 2020-01-17 – 2020-01-20 (×4): 20 mg via ORAL
  Filled 2020-01-16 (×4): qty 4

## 2020-01-16 MED ORDER — PANTOPRAZOLE SODIUM 40 MG PO TBEC
40.0000 mg | DELAYED_RELEASE_TABLET | Freq: Every day | ORAL | Status: DC
  Administered 2020-01-17 – 2020-01-20 (×4): 40 mg via ORAL
  Filled 2020-01-16 (×4): qty 1

## 2020-01-16 SURGICAL SUPPLY — 21 items
BAG URO CATCHER STRL LF (MISCELLANEOUS) ×4 IMPLANT
BASKET ZERO TIP NITINOL 2.4FR (BASKET) IMPLANT
BSKT STON RTRVL ZERO TP 2.4FR (BASKET)
CATH URET 5FR 28IN OPEN ENDED (CATHETERS) ×4 IMPLANT
CLOTH BEACON ORANGE TIMEOUT ST (SAFETY) ×4 IMPLANT
COVER WAND RF STERILE (DRAPES) IMPLANT
DRSG TEGADERM 2-3/8X2-3/4 SM (GAUZE/BANDAGES/DRESSINGS) ×1 IMPLANT
EXTRACTOR STONE 1.7FRX115CM (UROLOGICAL SUPPLIES) IMPLANT
GLOVE BIO SURGEON STRL SZ 6.5 (GLOVE) ×3 IMPLANT
GLOVE BIO SURGEONS STRL SZ 6.5 (GLOVE) ×1
GOWN STRL REUS W/TWL LRG LVL3 (GOWN DISPOSABLE) ×4 IMPLANT
GUIDEWIRE STR DUAL SENSOR (WIRE) ×4 IMPLANT
KIT TURNOVER KIT A (KITS) IMPLANT
MANIFOLD NEPTUNE II (INSTRUMENTS) ×4 IMPLANT
PACK CYSTO (CUSTOM PROCEDURE TRAY) ×4 IMPLANT
SHEATH URETERAL 12FRX28CM (UROLOGICAL SUPPLIES) IMPLANT
SHEATH URETERAL 12FRX35CM (MISCELLANEOUS) IMPLANT
TUBING CONNECTING 10 (TUBING) ×3 IMPLANT
TUBING CONNECTING 10' (TUBING) ×1
TUBING UROLOGY SET (TUBING) ×4 IMPLANT
WIRE COONS/BENSON .038X145CM (WIRE) IMPLANT

## 2020-01-16 NOTE — Progress Notes (Signed)
    Called by RN as patient's HR remains elevated above 140. Diltiazem was increased to 15mg /hr with no improvement. Blood pressures are soft in the [id number] systolic. Will stop Dilt at this time, switch and load with IV amiodarone to attempt better rate control. Review of meds shows patient was started on Eliquis for stroke risk reduction.   SignedReino Bellis, NP-C 01/16/2020, 7:02 PM Pager: [phone]

## 2020-01-16 NOTE — Consult Note (Addendum)
Cardiology Consultation:   Patient ID: Holly [name] MRN: [id number]; DOB: 07/15/1966  Admit date: 01/16/2020 Date of Consult: 01/16/2020  Primary Care Provider: Shirline Frees, MD Primary Cardiologist: No primary care provider on file.  Primary Electrophysiologist:  None    Patient Profile:   Holly [name] is a 54 y.o. female with a hx of severe osteoarthritis B/L knees, obesity, diabetes, chronic back pain, HLD, and Depression/anxiety who is being seen today for the evaluation of Afib RVR at the request of Dr. Claudia Desanctis.  History of Present Illness:   Holly [name] has no previous cardiac history. No history of MI, stent, heart failure, stroke, HLD, HTN, stroke. She does have diabetes, recent A1C 7.5%. Patient takes atorvastatin for HLD. Family history positive for afib and mother for CABG and afib. No tobacco/alcohol/drug use. She uses a wheelchair outside of the house and a cane inside of the house. The patient can only walk a couple of steps while using the cane. She has chronic lower leg edema for which she does not take anything for.  Patient denies caffeine use. No thyroid disease. She takes pain meds and Tyelonol daily for chronic back pain.    The patient has had several months of back pain ultimately found to have 25mm left nonobstructive renal stone on L-spine CT. She was seen by urology yesterday in clinic. She has no prior history of kidney stone. Pain was 9/10. Denied hematuria. Patient was planned for ureteroscopy with laser lithotripsy and retrograde pyelogram and possible stent placement. This morning, 01/16/20, the patient arrived for her scheduled procedure. When the patient arrived she seemed to be in sinus. When the patient was transferred heart rate was noted to increase to 160s from baseline of 60s. EKG obtained showed Afib RVR, rate 158. Cardiology consulted.   Past Medical History:  Diagnosis Date  . Anxiety   . Depression   . Diabetes mellitus without complication ([place])     type 2  . GERD (gastroesophageal reflux disease)   . Headache    cluster headaches  . History of hiatal hernia   . Osteoarthritis of both knees   . Pneumonia   . Shortness of breath dyspnea    on exertion  . Sleep apnea    CPAP    Past Surgical History:  Procedure Laterality Date  . ANKLE ARTHROSCOPY     bone spurs  . FOOT NEUROMA SURGERY    . HYSTEROSCOPY WITH D & C N/A 06/25/2015   Procedure: DILATATION AND CURETTAGE /HYSTEROSCOPY;  Surgeon: Brien Few, MD;  Location: [facility];  Service: Gynecology;  Laterality: N/A;  . KNEE ARTHROSCOPY     x 4   . TEAR DUCT PROBING Right      Home Medications:  Prior to Admission medications   Medication Sig Start Date End Date Taking? Authorizing Provider  acetaminophen (TYLENOL) 650 MG CR tablet Take 650 mg by mouth every 8 (eight) hours as needed for pain.   Yes [provider]  Ascorbic Acid (VITAMIN C) 1000 MG tablet Take 1,000 mg by mouth in the morning and at bedtime.    Yes [provider]  atorvastatin (LIPITOR) 10 MG tablet Take 10 mg by mouth at bedtime.   Yes [provider]  calcium carbonate (OS-CAL) 600 MG TABS tablet Take 600 mg by mouth 2 (two) times daily with a meal.   Yes [provider]  cholecalciferol (VITAMIN D) 1000 UNITS tablet Take 1,000 Units by mouth 2 (two) times daily.  Yes [provider]  citalopram (CELEXA) 40 MG tablet Take 40 mg by mouth every evening.    Yes [provider]  cyanocobalamin (,VITAMIN B-12,) 1000 MCG/ML injection Inject 1,000 mcg into the muscle every 30 (thirty) days.   Yes [provider]  ferrous sulfate (IRON SUPPLEMENT) 325 (65 FE) MG tablet Take 325 mg by mouth daily with breakfast.   Yes [provider]  fluticasone (FLONASE) 50 MCG/ACT nasal spray Place 2 sprays into both nostrils daily as needed for allergies or rhinitis.   Yes [provider]  insulin NPH-regular Human (NOVOLIN 70/30) (70-30) 100  UNIT/ML injection Inject 50 Units into the skin daily with supper.    Yes [provider]  levocetirizine (XYZAL) 5 MG tablet Take 5 mg by mouth at bedtime. 12/15/19  Yes [provider]  meloxicam (MOBIC) 15 MG tablet Take 15 mg by mouth every other day.    Yes [provider]  metFORMIN (GLUCOPHAGE-XR) 500 MG 24 hr tablet Take 1,000 mg by mouth 2 (two) times daily after a meal.   Yes [provider]  montelukast (SINGULAIR) 10 MG tablet Take 10 mg by mouth at bedtime.   Yes [provider]  norethindrone (AYGESTIN) 5 MG tablet Take 20 mg by mouth daily.    Yes [provider]  pantoprazole (PROTONIX) 40 MG tablet Take 40 mg by mouth daily.   Yes [provider]  solifenacin (VESICARE) 10 MG tablet Take 10 mg by mouth daily. 12/22/19  Yes [provider]  topiramate (TOPAMAX) 100 MG tablet Take 150 mg by mouth every evening.    Yes [provider]  vitamin B-12 (CYANOCOBALAMIN) 1000 MCG tablet Take 1,000 mcg by mouth daily.   Yes [provider]  benzonatate (TESSALON) 200 MG capsule Take 1 capsule (200 mg total) by mouth 3 (three) times daily as needed for cough. Patient not taking: Reported on 01/09/2020 02/18/17   Tanda Rockers, MD  HYDROcodone-acetaminophen [facility]) 10-325 MG tablet Take 1 tablet by mouth every 6 (six) hours as needed for pain. 12/29/19   [provider]  HYDROcodone-ibuprofen (VICOPROFEN) 7.5-200 MG per tablet Take 1 tablet by mouth every 8 (eight) hours as needed for moderate pain. Patient not taking: Reported on 01/09/2020 06/25/15   Brien Few, MD    Inpatient Medications: Scheduled Meds:  Continuous Infusions: . cefTRIAXone (ROCEPHIN)  IV    . lactated ringers 50 mL/hr at 01/16/20 0945   PRN Meds: fentaNYL (SUBLIMAZE) injection, oxyCODONE **OR** oxyCODONE, promethazine  Allergies:    Allergies  Allergen Reactions  . Levaquin [Levofloxacin] Hives and Shortness Of  Breath    Pt was on levaquin & vancomycin at same time & doesn't know which one caused the reaction  . Vancomycin Hives and Shortness Of Breath    Pt was levaquin & vancomycin at the same & doesn't know which one caused the reaction.    Donna Bernard [Liraglutide] Nausea Only  . Tape Rash    Social History:   Social History   Socioeconomic History  . Marital status: Single    Spouse name: Not on file  . Number of children: Not on file  . Years of education: Not on file  . Highest education level: Not on file  Occupational History  . Not on file  Tobacco Use  . Smoking status: Never Smoker  . Smokeless tobacco: Never Used  Substance and Sexual Activity  . Alcohol use: No  . Drug use: No  .  Sexual activity: Not on file  Other Topics Concern  . Not on file  Social History Narrative  . Not on file   Social Determinants of Health   Financial Resource Strain:   . Difficulty of Paying Living Expenses:   Food Insecurity:   . Worried About Charity fundraiser in the Last Year:   . Arboriculturist in the Last Year:   Transportation Needs:   . Film/video editor (Medical):   Marland Kitchen Lack of Transportation (Non-Medical):   Physical Activity:   . Days of Exercise per Week:   . Minutes of Exercise per Session:   Stress:   . Feeling of Stress :   Social Connections:   . Frequency of Communication with Friends and Family:   . Frequency of Social Gatherings with Friends and Family:   . Attends Religious Services:   . Active Member of Clubs or Organizations:   . Attends Archivist Meetings:   Marland Kitchen Marital Status:   Intimate Partner Violence:   . Fear of Current or Ex-Partner:   . Emotionally Abused:   Marland Kitchen Physically Abused:   . Sexually Abused:     Family History:   Family History  Problem Relation Age of Onset  . Pneumonia Mother   . Atrial fibrillation Mother   . Emphysema Father        smoked  . Lung cancer Father   . Rheum arthritis Sister      ROS:  Please see  the history of present illness.  All other ROS reviewed and negative.     Physical Exam/Data:   Vitals:   01/16/20 1300 01/16/20 1315 01/16/20 1330 01/16/20 1345  BP: (!) 113/98 (!) 138/98 (!) 168/105 125/83  Pulse: 61 (!) 102 (!) 142 (!) 143  Resp: (!) 22 19 (!) 21 20  Temp:   98.4 F (36.9 C)   TempSrc:      SpO2: 98% 97% 98% 97%    Intake/Output Summary (Last 24 hours) at 01/16/2020 1355 Last data filed at 01/16/2020 1232 Gross per 24 hour  Intake 200 ml  Output --  Net 200 ml   Last 3 Weights 01/11/2020 01/10/2020 02/18/2017  Weight (lbs) 385 lb 4 oz 385 lb 433 lb  Weight (kg) 174.748 kg 174.635 kg 196.408 kg     There is no height or weight on file to calculate BMI.  General:  Well nourished, well developed, in no acute distress HEENT: normal Lymph: no adenopathy Neck: no JVD Endocrine:  No thryomegaly Vascular: No carotid bruits; FA pulses 2+ bilaterally without bruits  Cardiac:  normal S1, S2; RRR; no murmur  Lungs:  clear to auscultation bilaterally, no wheezing, rhonchi or rales  Abd: soft, nontender, no hepatomegaly  Ext: 1+ edema Musculoskeletal:  No deformities, BUE and BLE strength normal and equal Skin: warm and dry  Neuro:  CNs 2-12 intact, no focal abnormalities noted Psych:  Normal affect   EKG:  The EKG was personally reviewed and demonstrates:  Afib RVR, 158 bpm Telemetry:  Telemetry was personally reviewed and demonstrates:  Afib RVR, HR up to 160  Relevant CV Studies:  Echo ordered  Laboratory Data:  High Sensitivity Troponin:  No results for input(s): TROPONINIHS in the last 720 hours.   Chemistry Recent Labs  Lab 01/11/20 0815  NA 139  K 4.6  CL 105  CO2 23  GLUCOSE 133*  BUN 20  CREATININE 1.17*  CALCIUM 9.3  GFRNONAA 54*  GFRAA >60  ANIONGAP 11    No results for input(s): PROT, ALBUMIN, AST, ALT, ALKPHOS, BILITOT in the last 168 hours. Hematology Recent Labs  Lab 01/11/20 0815  WBC 10.2  RBC 5.74*  HGB 15.0  HCT 49.8*  MCV  86.8  MCH 26.1  MCHC 30.1  RDW 16.3*  PLT 289   BNPNo results for input(s): BNP, PROBNP in the last 168 hours.  DDimer No results for input(s): DDIMER in the last 168 hours.   Radiology/Studies:  No results found. {    Assessment and Plan:   New onset Afib RVR Patient arrived to her scheduled procedure for left calculus/severe back pain and converted to afib RVR in the OR.  - No prior history of afib - Patient denies chest pain, lightheadedness, dizziness, shortness of breath. - CHADSVASC = 2 (female, DM) needs longterm anticoagulation. Can start IV heparin if plans to be admitted and undergo procedure per urology.  - would recommend admission by hospitalist - start IV dilt for rate control - will order echo>>perform tomorrow when better rate controlled  - potassium 4.6 - check TSH and Mag - monitor on telemetry - If patient remains in Afib RVR would consider TEE/DCCV later this week  Left renal calculus - planned for elective procedure ureteroscopy with laser lithotripsy and retrograde pyelogram and possible stent placement>>held for the above - plan to proceed once patient is stable or if emergent. Defer to urology  HLD - at baseline is on atorvastatin 10 mg daily - check lipids  DM2 on insulin - A1C 7.5% 01/11/20  For questions or updates, please contact [place] Please consult [email or web address] for contact info under     Signed, Taylan Mayhan Ninfa Meeker, PA-C  01/16/2020 1:55 PM

## 2020-01-16 NOTE — Transfer of Care (Signed)
Immediate Anesthesia Transfer of Care Note  Patient: Holly [name]  Procedure(s) Performed: CYSTOSCOPY WITH RETROGRADE PYELOGRAM, URETEROSCOPY AND STENT PLACEMENT (Left ) HOLMIUM LASER APPLICATION (Left )  Patient Location: PACU  Anesthesia Type:N/A  Level of Consciousness: awake, alert  and oriented  Airway & Oxygen Therapy: Patient Spontanous Breathing  Post-op Assessment: Report given to RN and Post -op Vital signs reviewed and stable  Post vital signs: Reviewed and stable  Last Vitals:  Vitals Value Taken Time  BP 126/92 01/16/20 1225  Temp    Pulse 159 01/16/20 1229  Resp 19 01/16/20 1229  SpO2 94 % 01/16/20 1229  Vitals shown include unvalidated device data.  Last Pain:  Vitals:   01/16/20 0930  TempSrc: Oral         Complications: Patient hooked up to monitors and Dr Kalman Shan called. HTN and HR 140-160. Case cancelled.

## 2020-01-16 NOTE — H&P (Signed)
History and Physical    Holly [name] [id number] DOB: 1966-03-03 DOA: 01/16/2020  PCP: Shirline Frees, MD Patient coming from: PACU. Lives at home  Chief Complaint: Atrial fibrillation with RVR  HPI: Holly [name] is a 54 y.o. female with medical history significant of anxiety, depression, headaches, GERD, Obesity, OSA. Patient presented for an elective cystoscopy with retrograde pyelogram with left ureteroscopy and stent placement. In PACU, she developed significant tachycardia with rates up to 160 bpm. EKG significant for atrial fibrillation with RVR. Cardiology consulted and started patient on a Cardizem drip.   Review of Systems: Review of Systems  Constitutional: Negative for chills and fever.  Respiratory: Negative for shortness of breath.   Cardiovascular: Negative for chest pain and palpitations.  Gastrointestinal: Positive for nausea.  All other systems reviewed and are negative.   Past Medical History:  Diagnosis Date  . Anxiety   . Depression   . Diabetes mellitus without complication ([place])    type 2  . GERD (gastroesophageal reflux disease)   . Headache    cluster headaches  . History of hiatal hernia   . Osteoarthritis of both knees   . Pneumonia   . Shortness of breath dyspnea    on exertion  . Sleep apnea    CPAP    Past Surgical History:  Procedure Laterality Date  . ANKLE ARTHROSCOPY     bone spurs  . FOOT NEUROMA SURGERY    . HYSTEROSCOPY WITH D & C N/A 06/25/2015   Procedure: DILATATION AND CURETTAGE /HYSTEROSCOPY;  Surgeon: Brien Few, MD;  Location: [facility];  Service: Gynecology;  Laterality: N/A;  . KNEE ARTHROSCOPY     x 4   . TEAR DUCT PROBING Right      reports that she has never smoked. She has never used smokeless tobacco. She reports that she does not drink alcohol or use drugs.  Allergies  Allergen Reactions  . Levaquin [Levofloxacin] Hives and Shortness Of Breath    Pt was on levaquin & vancomycin at same time & doesn't  know which one caused the reaction  . Vancomycin Hives and Shortness Of Breath    Pt was levaquin & vancomycin at the same & doesn't know which one caused the reaction.    Donna Bernard [Liraglutide] Nausea Only  . Tape Rash    Family History  Problem Relation Age of Onset  . Pneumonia Mother   . Atrial fibrillation Mother   . Emphysema Father        smoked  . Lung cancer Father   . Rheum arthritis Sister    Prior to Admission medications   Medication Sig Start Date End Date Taking? Authorizing Provider  acetaminophen (TYLENOL) 650 MG CR tablet Take 650 mg by mouth every 8 (eight) hours as needed for pain.   Yes [provider]  Ascorbic Acid (VITAMIN C) 1000 MG tablet Take 1,000 mg by mouth in the morning and at bedtime.    Yes [provider]  atorvastatin (LIPITOR) 10 MG tablet Take 10 mg by mouth at bedtime.   Yes [provider]  calcium carbonate (OS-CAL) 600 MG TABS tablet Take 600 mg by mouth 2 (two) times daily with a meal.   Yes [provider]  cholecalciferol (VITAMIN D) 1000 UNITS tablet Take 1,000 Units by mouth 2 (two) times daily.   Yes [provider]  citalopram (CELEXA) 40 MG tablet Take 40 mg by mouth every evening.    Yes [provider]  cyanocobalamin (,VITAMIN B-12,) 1000 MCG/ML injection Inject 1,000 mcg into the muscle every 30 (thirty) days.   Yes [provider]  ferrous sulfate (IRON SUPPLEMENT) 325 (65 FE) MG tablet Take 325 mg by mouth daily with breakfast.   Yes [provider]  fluticasone (FLONASE) 50 MCG/ACT nasal spray Place 2 sprays into both nostrils daily as needed for allergies or rhinitis.   Yes [provider]  insulin NPH-regular Human (NOVOLIN 70/30) (70-30) 100 UNIT/ML injection Inject 50 Units into the skin daily with supper.    Yes [provider]  levocetirizine (XYZAL) 5 MG tablet Take 5 mg by mouth at bedtime. 12/15/19  Yes [provider]    meloxicam (MOBIC) 15 MG tablet Take 15 mg by mouth every other day.    Yes [provider]  metFORMIN (GLUCOPHAGE-XR) 500 MG 24 hr tablet Take 1,000 mg by mouth 2 (two) times daily after a meal.   Yes [provider]  montelukast (SINGULAIR) 10 MG tablet Take 10 mg by mouth at bedtime.   Yes [provider]  norethindrone (AYGESTIN) 5 MG tablet Take 20 mg by mouth daily.    Yes [provider]  pantoprazole (PROTONIX) 40 MG tablet Take 40 mg by mouth daily.   Yes [provider]  solifenacin (VESICARE) 10 MG tablet Take 10 mg by mouth daily. 12/22/19  Yes [provider]  topiramate (TOPAMAX) 100 MG tablet Take 150 mg by mouth every evening.    Yes [provider]  vitamin B-12 (CYANOCOBALAMIN) 1000 MCG tablet Take 1,000 mcg by mouth daily.   Yes [provider]  benzonatate (TESSALON) 200 MG capsule Take 1 capsule (200 mg total) by mouth 3 (three) times daily as needed for cough. Patient not taking: Reported on 01/09/2020 02/18/17   Tanda Rockers, MD  HYDROcodone-acetaminophen [facility]) 10-325 MG tablet Take 1 tablet by mouth every 6 (six) hours as needed for pain. 12/29/19   [provider]  HYDROcodone-ibuprofen (VICOPROFEN) 7.5-200 MG per tablet Take 1 tablet by mouth every 8 (eight) hours as needed for moderate pain. Patient not taking: Reported on 01/09/2020 06/25/15   Brien Few, MD    Physical Exam:  Physical Exam Constitutional:      General: She is not in acute distress.    Appearance: She is well-developed. She is obese. She is not diaphoretic.  HENT:     Mouth/Throat:     Mouth: Mucous membranes are moist.  Eyes:     Conjunctiva/sclera: Conjunctivae normal.     Pupils: Pupils are equal, round, and reactive to light.  Cardiovascular:     Rate and Rhythm: Tachycardia present. Rhythm irregular.     Heart sounds: Normal heart sounds. No murmur.  Pulmonary:     Effort: Pulmonary effort is normal.  No respiratory distress.     Breath sounds: Normal breath sounds. No wheezing or rales.  Abdominal:     General: Bowel sounds are normal. There is no distension.     Palpations: Abdomen is soft.     Tenderness: There is no abdominal tenderness. There is no guarding or rebound.     Hernia: A hernia is present. Hernia is present in the umbilical area.  Musculoskeletal:        General: Normal range of motion.     Cervical back: Normal range of motion.     Right lower leg: Tenderness present. Edema present.     Left lower leg: Tenderness present. Edema present.  Lymphadenopathy:  Cervical: No cervical adenopathy.  Skin:    General: Skin is warm and dry.     Comments: Chronic venous changes  Neurological:     Mental Status: She is alert and oriented to person, place, and time.     Labs on Admission: I have personally reviewed following labs and imaging studies  CBC: Recent Labs  Lab 01/11/20 0815  WBC 10.2  HGB 15.0  HCT 49.8*  MCV 86.8  PLT [id number]    Basic Metabolic Panel: Recent Labs  Lab 01/11/20 0815  NA 139  K 4.6  CL 105  CO2 23  GLUCOSE 133*  BUN 20  CREATININE 1.17*  CALCIUM 9.3    GFR: Estimated Creatinine Clearance: 94.7 mL/min (A) (by C-G formula based on SCr of 1.17 mg/dL (H)).  Liver Function Tests: No results for input(s): AST, ALT, ALKPHOS, BILITOT, PROT, ALBUMIN in the last 168 hours. No results for input(s): LIPASE, AMYLASE in the last 168 hours. No results for input(s): AMMONIA in the last 168 hours.  Coagulation Profile: No results for input(s): INR, PROTIME in the last 168 hours.  Cardiac Enzymes: No results for input(s): CKTOTAL, CKMB, CKMBINDEX, TROPONINI in the last 168 hours.  BNP (last 3 results) No results for input(s): PROBNP in the last 8760 hours.  HbA1C: No results for input(s): HGBA1C in the last 72 hours.  CBG: Recent Labs  Lab 01/11/20 0830 01/16/20 0919 01/16/20 1158 01/16/20 1321  GLUCAP 122* 114* 108* 133*     Lipid Profile: No results for input(s): CHOL, HDL, LDLCALC, TRIG, CHOLHDL, LDLDIRECT in the last 72 hours.  Thyroid Function Tests: No results for input(s): TSH, T4TOTAL, FREET4, T3FREE, THYROIDAB in the last 72 hours.  Anemia Panel: No results for input(s): VITAMINB12, FOLATE, FERRITIN, TIBC, IRON, RETICCTPCT in the last 72 hours.  Urine analysis:    Component Value Date/Time   COLORURINE YELLOW 08/31/2007 [place] 08/31/2007 1343   LABSPEC 1.013 08/31/2007 1343   PHURINE 7.5 08/31/2007 1343   GLUCOSEU NEGATIVE 08/31/2007 1343   HGBUR NEGATIVE 08/31/2007 1343   BILIRUBINUR NEGATIVE 08/31/2007 1343   KETONESUR NEGATIVE 08/31/2007 1343   PROTEINUR NEGATIVE 08/31/2007 1343   UROBILINOGEN 0.2 08/31/2007 1343   NITRITE NEGATIVE 08/31/2007 1343   LEUKOCYTESUR  08/31/2007 1343    NEGATIVE MICROSCOPIC NOT DONE ON URINES WITH NEGATIVE PROTEIN, BLOOD, LEUKOCYTES, NITRITE, OR GLUCOSE <1000 mg/dL.     Radiological Exams on Admission: No results found.  EKG: Independently reviewed. Atrial fibrillation. Tachycardia  Assessment/Plan Active Problems:   Atrial fibrillation with RVR (HCC)   Atrial fibrillation with RVR New diagnosis. Cardiology consulted. Started on diltiazem drip and Eliquis. CBC, BMP, magnesium, TSH pending -Per Cardiology -Transthoracic Echocardiogram pending -Cardizem drip -Eliquis  Left non obstructing renal calculus Patient initially in the hospital for cystoscopy.  -Per Urology  Diabetes mellitus, type 2 Insulin dependent. Currently on NPH-regular 50 units daily with supper and metformin. -Start Lantus 30 units daily while inpatient -Novolog SSI  Obstructive sleep apnea -Continue CPAP qhs  Cough variant asthma -Continue Flonase, Singulair  Anxiety -Continue Celexa  Hyperlipidemia -Continue Lipitor  GERD -Continue Protonix  Endometriosis -Continue norethindrone  Chronic venous changes Secondary to weight/edema.  Associated scaly/dry skin -Eucerin BID -Wound consult for patient education for better management of legs  Chronic headaches -Continue Topamax   DVT prophylaxis: Eliquis Code Status: Full code Family Communication: None at bedside Disposition Plan: Stepdown unit Consults called: Cardiology by anesthesia Admission status: Observation   Cordelia Poche, MD [facility] 01/16/2020, 4:07 PM

## 2020-01-16 NOTE — Progress Notes (Signed)
Patient surgery was cancelled after HR increased to 160s (from baseline 0000000) and diastolic BP [id number] after being moved from stretcher to bed on hoverboard.  Anesthesia called to discuss HR with cardiology.  Patient surgery to be rescheduled after evaluation by cardiology. She was transferred to PACU.

## 2020-01-16 NOTE — Interval H&P Note (Signed)
History and Physical Interval Note:  01/16/2020 9:47 AM  Holly [name]  has presented today for surgery, with the diagnosis of LEFT RENAL CALCULUS.  The various methods of treatment have been discussed with the patient and family. After consideration of risks, benefits and other options for treatment, the patient has consented to  Procedure(s): CYSTOSCOPY WITH RETROGRADE PYELOGRAM, URETEROSCOPY AND STENT PLACEMENT (Left) HOLMIUM LASER APPLICATION (Left) as a surgical intervention.  The patient's history has been reviewed, patient examined, no change in status, stable for surgery.  I have reviewed the patient's chart and labs.  Questions were answered to the patient's satisfaction.     Racquelle Hyser D Kerisha Goughnour

## 2020-01-16 NOTE — Anesthesia Postprocedure Evaluation (Signed)
Anesthesia Post Note  Patient: Holly [name]  Procedure([name]) Performed: Procedure name not found.     Anesthesia Type: General Anesthetic complications: yes Comments: Patient went into AFib with RVR immediately prior to induction in OR. Case cancelled, cardiology consulted    Last Vitals:  Vitals:   01/16/20 1330 01/16/20 1345  BP: (!) 168/105 125/83  Pulse: (!) 142 (!) 143  Resp: (!) 21 20  Temp: 36.9 C   SpO2: 98% 97%    Last Pain:  Vitals:   01/16/20 1345  TempSrc:   PainSc: 3                  Holly [name]

## 2020-01-17 ENCOUNTER — Other Ambulatory Visit: Payer: Self-pay

## 2020-01-17 ENCOUNTER — Observation Stay (HOSPITAL_COMMUNITY): Payer: Medicare Other

## 2020-01-17 ENCOUNTER — Other Ambulatory Visit (HOSPITAL_COMMUNITY): Payer: Medicare Other

## 2020-01-17 ENCOUNTER — Inpatient Hospital Stay (HOSPITAL_COMMUNITY): Payer: Medicare Other

## 2020-01-17 DIAGNOSIS — I4891 Unspecified atrial fibrillation: Secondary | ICD-10-CM | POA: Diagnosis not present

## 2020-01-17 DIAGNOSIS — E785 Hyperlipidemia, unspecified: Secondary | ICD-10-CM | POA: Diagnosis not present

## 2020-01-17 DIAGNOSIS — Z825 Family history of asthma and other chronic lower respiratory diseases: Secondary | ICD-10-CM | POA: Diagnosis not present

## 2020-01-17 DIAGNOSIS — E119 Type 2 diabetes mellitus without complications: Secondary | ICD-10-CM | POA: Diagnosis not present

## 2020-01-17 DIAGNOSIS — M170 Bilateral primary osteoarthritis of knee: Secondary | ICD-10-CM | POA: Diagnosis present

## 2020-01-17 DIAGNOSIS — I3139 Other pericardial effusion (noninflammatory): Secondary | ICD-10-CM

## 2020-01-17 DIAGNOSIS — I10 Essential (primary) hypertension: Secondary | ICD-10-CM | POA: Diagnosis present

## 2020-01-17 DIAGNOSIS — Z9989 Dependence on other enabling machines and devices: Secondary | ICD-10-CM

## 2020-01-17 DIAGNOSIS — F418 Other specified anxiety disorders: Secondary | ICD-10-CM | POA: Diagnosis present

## 2020-01-17 DIAGNOSIS — Z6841 Body Mass Index (BMI) 40.0 and over, adult: Secondary | ICD-10-CM | POA: Diagnosis not present

## 2020-01-17 DIAGNOSIS — Z8261 Family history of arthritis: Secondary | ICD-10-CM | POA: Diagnosis not present

## 2020-01-17 DIAGNOSIS — G4733 Obstructive sleep apnea (adult) (pediatric): Secondary | ICD-10-CM

## 2020-01-17 DIAGNOSIS — K219 Gastro-esophageal reflux disease without esophagitis: Secondary | ICD-10-CM | POA: Diagnosis not present

## 2020-01-17 DIAGNOSIS — Z20822 Contact with and (suspected) exposure to covid-19: Secondary | ICD-10-CM | POA: Diagnosis not present

## 2020-01-17 DIAGNOSIS — G8929 Other chronic pain: Secondary | ICD-10-CM | POA: Diagnosis present

## 2020-01-17 DIAGNOSIS — J45991 Cough variant asthma: Secondary | ICD-10-CM | POA: Diagnosis not present

## 2020-01-17 DIAGNOSIS — I313 Pericardial effusion (noninflammatory): Secondary | ICD-10-CM

## 2020-01-17 DIAGNOSIS — I420 Dilated cardiomyopathy: Secondary | ICD-10-CM | POA: Diagnosis present

## 2020-01-17 DIAGNOSIS — N809 Endometriosis, unspecified: Secondary | ICD-10-CM | POA: Diagnosis not present

## 2020-01-17 DIAGNOSIS — I340 Nonrheumatic mitral (valve) insufficiency: Secondary | ICD-10-CM | POA: Diagnosis not present

## 2020-01-17 DIAGNOSIS — Z87442 Personal history of urinary calculi: Secondary | ICD-10-CM | POA: Diagnosis not present

## 2020-01-17 DIAGNOSIS — Z66 Do not resuscitate: Secondary | ICD-10-CM | POA: Diagnosis not present

## 2020-01-17 DIAGNOSIS — N200 Calculus of kidney: Secondary | ICD-10-CM | POA: Diagnosis not present

## 2020-01-17 DIAGNOSIS — R519 Headache, unspecified: Secondary | ICD-10-CM | POA: Diagnosis present

## 2020-01-17 DIAGNOSIS — Z801 Family history of malignant neoplasm of trachea, bronchus and lung: Secondary | ICD-10-CM | POA: Diagnosis not present

## 2020-01-17 DIAGNOSIS — Z794 Long term (current) use of insulin: Secondary | ICD-10-CM | POA: Diagnosis not present

## 2020-01-17 LAB — CBC
HCT: 43.7 % (ref 36.0–46.0)
Hemoglobin: 13.6 g/dL (ref 12.0–15.0)
MCH: 26.5 pg (ref 26.0–34.0)
MCHC: 31.1 g/dL (ref 30.0–36.0)
MCV: 85.2 fL (ref 80.0–100.0)
Platelets: 243 10*3/uL (ref 150–400)
RBC: 5.13 MIL/uL — ABNORMAL HIGH (ref 3.87–5.11)
RDW: 16.2 % — ABNORMAL HIGH (ref 11.5–15.5)
WBC: 10.2 10*3/uL (ref 4.0–10.5)
nRBC: 0 % (ref 0.0–0.2)

## 2020-01-17 LAB — GLUCOSE, CAPILLARY
Glucose-Capillary: 161 mg/dL — ABNORMAL HIGH (ref 70–99)
Glucose-Capillary: 156 mg/dL — ABNORMAL HIGH (ref 70–99)
Glucose-Capillary: 242 mg/dL — ABNORMAL HIGH (ref 70–99)
Glucose-Capillary: 169 mg/dL — ABNORMAL HIGH (ref 70–99)

## 2020-01-17 LAB — BASIC METABOLIC PANEL
Anion gap: 10 (ref 5–15)
BUN: 16 mg/dL (ref 6–20)
CO2: 23 mmol/L (ref 22–32)
Calcium: 9.3 mg/dL (ref 8.9–10.3)
Chloride: 108 mmol/L (ref 98–111)
Creatinine, Ser: 1.02 mg/dL — ABNORMAL HIGH (ref 0.44–1.00)
GFR calc Af Amer: >60 mL/min (ref >60)
GFR calc non Af Amer: >60 mL/min (ref >60)
Glucose, Bld: 174 mg/dL — ABNORMAL HIGH (ref 70–99)
Potassium: 4 mmol/L (ref 3.5–5.1)
Sodium: 141 mmol/L (ref 135–145)

## 2020-01-17 LAB — URINALYSIS, ROUTINE W REFLEX MICROSCOPIC
Bacteria, UA: NONE SEEN
Bilirubin Urine: NEGATIVE
Glucose, UA: NEGATIVE mg/dL
Ketones, ur: NEGATIVE mg/dL
Leukocytes,Ua: NEGATIVE
Nitrite: NEGATIVE
Protein, ur: 100 mg/dL — AB
RBC / HPF: >50 RBC/hpf — ABNORMAL HIGH (ref 0–5)
Specific Gravity, Urine: 1.023 (ref 1.005–1.030)
WBC, UA: >50 WBC/hpf — ABNORMAL HIGH (ref 0–5)
pH: 6 (ref 5.0–8.0)

## 2020-01-17 LAB — ECHOCARDIOGRAM LIMITED
Height: 66 in
Weight: 6179.93 oz

## 2020-01-17 LAB — SEDIMENTATION RATE: Sed Rate: 28 mm/hr — ABNORMAL HIGH (ref 0–22)

## 2020-01-17 MED ORDER — METOPROLOL TARTRATE 25 MG PO TABS
25.0000 mg | ORAL_TABLET | Freq: Two times a day (BID) | ORAL | Status: DC
  Administered 2020-01-17 (×2): 25 mg via ORAL
  Filled 2020-01-17 (×3): qty 1

## 2020-01-17 MED ORDER — PERFLUTREN LIPID MICROSPHERE
1.0000 mL | INTRAVENOUS | Status: AC | PRN
  Administered 2020-01-17: 09:00:00 2 mL via INTRAVENOUS
  Filled 2020-01-17: qty 10

## 2020-01-17 MED ORDER — AMIODARONE LOAD VIA INFUSION
150.0000 mg | Freq: Once | INTRAVENOUS | Status: AC
  Administered 2020-01-17: 150 mg via INTRAVENOUS
  Filled 2020-01-17: qty 83.34

## 2020-01-17 MED ORDER — MAGNESIUM SULFATE 2 GM/50ML IV SOLN
2.0000 g | Freq: Once | INTRAVENOUS | Status: AC
  Administered 2020-01-17: 2 g via INTRAVENOUS
  Filled 2020-01-17: qty 50

## 2020-01-17 NOTE — Progress Notes (Signed)
EKG done place on chart Dr. Radford Pax on unit and made aware of findings.

## 2020-01-17 NOTE — Progress Notes (Signed)
  Echocardiogram 2D Echocardiogram has been performed.  Holly [name] 01/17/2020, 7:03 PM

## 2020-01-17 NOTE — Progress Notes (Signed)
Progress Note  Patient Name: Holly [name] Date of Encounter: 01/17/2020  Primary Cardiologist: Fransico Him, MD   Subjective   Rates remained elevated overnight and patient was transitioned to amiodarone. Rates now 110-120. Patient still asymptomatic.   Patient reported blood in her urine since 12PM last night. She is still having severe back pain.   Inpatient Medications    Scheduled Meds: . apixaban  5 mg Oral BID  . atorvastatin  10 mg Oral QHS  . calcium carbonate  1 tablet Oral BID WC  . Chlorhexidine Gluconate Cloth  6 each Topical Daily  . cholecalciferol  1,000 Units Oral BID  . citalopram  40 mg Oral QHS  . darifenacin  15 mg Oral Daily  . ferrous sulfate  325 mg Oral Q breakfast  . hydrocerin   Topical BID  . insulin aspart  0-15 Units Subcutaneous TID WC  . insulin aspart  0-5 Units Subcutaneous QHS  . insulin glargine  30 Units Subcutaneous QHS  . loratadine  10 mg Oral QHS  . mouth rinse  15 mL Mouth Rinse BID  . montelukast  10 mg Oral QHS  . norethindrone  20 mg Oral Daily  . pantoprazole  40 mg Oral Daily  . topiramate  150 mg Oral QPM  . vitamin B-12  1,000 mcg Oral Daily   Continuous Infusions: . amiodarone 30 mg/hr (01/17/20 0600)  . lactated ringers 50 mL/hr at 01/17/20 0600  . magnesium sulfate bolus IVPB     PRN Meds: acetaminophen **OR** acetaminophen, fluticasone, HYDROcodone-acetaminophen   Vital Signs    Vitals:   01/17/20 0400 01/17/20 0500 01/17/20 0600 01/17/20 0700  BP: (!) 149/102 (!) 137/95 (!) 132/108   Pulse: (!) 55 65 (!) 44   Resp: 17 17 18    Temp: 97.8 F (36.6 C)   98.4 F (36.9 C)  TempSrc: Oral   Oral  SpO2: 96% 98% 98%     Intake/Output Summary (Last 24 hours) at 01/17/2020 0852 Last data filed at 01/17/2020 0645 Gross per 24 hour  Intake 1593.25 ml  Output 600 ml  Net 993.25 ml   Last 3 Weights 01/11/2020 01/10/2020 02/18/2017  Weight (lbs) 385 lb 4 oz 385 lb 433 lb  Weight (kg) 174.748 kg 174.635 kg 196.408 kg       Telemetry    Afib, HR 100-130, occasional PVCs - Personally Reviewed  ECG    No new - Personally Reviewed  Physical Exam   GEN: No acute distress.   Neck: difficult to assess JVD Cardiac: Irreg Irreg, tachy, no murmurs, rubs, or gallops.  Respiratory: Clear to auscultation bilaterally. GI: Soft, nontender, non-distended  MS: + edema; No deformity. Neuro:  Nonfocal  Psych: Normal affect   Labs    High Sensitivity Troponin:  No results for input(s): TROPONINIHS in the last 720 hours.    Chemistry Recent Labs  Lab 01/11/20 0815 01/16/20 1448 01/17/20 0234  NA 139 141 141  K 4.6 4.0 4.0  CL 105 109 108  CO2 23 22 23   GLUCOSE 133* 125* 174*  BUN 20 16 16   CREATININE 1.17* 0.94 1.02*  CALCIUM 9.3 9.4 9.3  GFRNONAA 54* >60 >60  GFRAA >60 >60 >60  ANIONGAP 11 10 10      Hematology Recent Labs  Lab 01/11/20 0815 01/16/20 1448 01/17/20 0234  WBC 10.2 10.8* 10.2  RBC 5.74* 5.43* 5.13*  HGB 15.0 14.1 13.6  HCT 49.8* 46.0 43.7  MCV 86.8 84.7 85.2  MCH  26.1 26.0 26.5  MCHC 30.1 30.7 31.1  RDW 16.3* 16.2* 16.2*  PLT 289 268 243    BNPNo results for input(s): BNP, PROBNP in the last 168 hours.   DDimer No results for input(s): DDIMER in the last 168 hours.   Radiology    No results found.  Cardiac Studies   Echo today  Patient Profile     54 y.o. female with a hx of severe osteoarthritis B/L knees, obesity, diabetes, chronic back pain, HLD, and Depression/anxiety who is being seen today for the evaluation of Afib RVR.  Assessment & Plan   New onset Afib RVR Patient arrived to her scheduled procedure for left calculus/severe back pain and converted to afib RVR in the OR.  - No prior history of afib - Patient is asymptomatic - CHADSVASC = 2 (female, DM)  - started on Eliquis 5mg  BID. Even though patient had hematuria overnight would continue Eliquis for now. Hgb 13.6. Might need to get urology involved. Kidney function stable - IV dilt started  for rate control>>switched to amiodarone this AM for persistently high heart rates. Rates currently 100-120s - Echo today - potassium 4.0 - Mag 1.6 goal >2 - TSH 2.11 - If patient remains in Afib RVR would consider TEE/DCCV . Will discuss with MD  Left renal calculus - planned for elective procedure ureteroscopy with laser lithotripsy and retrograde pyelogram and possible stent placement>>held for the above - plan to proceed once patient is stable  - Hematuria overnight. Will check a UA  HLD - at baseline is on atorvastatin 10 mg daily - check lipids  DM2 on insulin - A1C 7.5% 01/11/20  For questions or updates, please contact [place] Please consult [email or web address] for contact info under        Signed, Marlene Pfluger Ninfa Meeker, PA-C  01/17/2020, 8:52 AM

## 2020-01-17 NOTE — Progress Notes (Signed)
Arrived to unit, site established by RN. Cancel consult.

## 2020-01-17 NOTE — Discharge Instructions (Signed)
Please contact [facility] at [phone] after you have seen a cardiologist.  We can reschedule surgery after you have been evaluated and it appears safe to continue.    Information on my medicine - ELIQUIS (apixaban)  This medication education was reviewed with me or my healthcare representative as part of my discharge preparation.    Why was Eliquis prescribed for you? Eliquis was prescribed for you to reduce the risk of a blood clot forming that can cause a stroke if you have a medical condition called atrial fibrillation (a type of irregular heartbeat).  What do You need to know about Eliquis ? Take your Eliquis TWICE DAILY - one tablet in the morning and one tablet in the evening with or without food. If you have difficulty swallowing the tablet whole please discuss with your pharmacist how to take the medication safely.  Take Eliquis exactly as prescribed by your doctor and DO NOT stop taking Eliquis without talking to the doctor who prescribed the medication.  Stopping may increase your risk of developing a stroke.  Refill your prescription before you run out.  After discharge, you should have regular check-up appointments with your healthcare provider that is prescribing your Eliquis.  In the future your dose may need to be changed if your kidney function or weight changes by a significant amount or as you get older.  What do you do if you miss a dose? If you miss a dose, take it as soon as you remember on the same day and resume taking twice daily.  Do not take more than one dose of ELIQUIS at the same time to make up a missed dose.  Important Safety Information A possible side effect of Eliquis is bleeding. You should call your healthcare provider right away if you experience any of the following: ? Bleeding from an injury or your nose that does not stop. ? Unusual colored urine (red or dark brown) or unusual colored stools (red or black). ? Unusual bruising for  unknown reasons. ? A serious fall or if you hit your head (even if there is no bleeding).  Some medicines may interact with Eliquis and might increase your risk of bleeding or clotting while on Eliquis. To help avoid this, consult your healthcare provider or pharmacist prior to using any new prescription or non-prescription medications, including herbals, vitamins, non-steroidal anti-inflammatory drugs (NSAIDs) and supplements.  This website has more information on Eliquis (apixaban): http://www.eliquis.com/eliquis/home

## 2020-01-17 NOTE — Progress Notes (Signed)
  Echocardiogram 2D Echocardiogram has been performed.  Holly [name] 01/17/2020, 8:58 AM

## 2020-01-17 NOTE — Progress Notes (Signed)
[facility]                                                                              Patient Demographics  Holly [name], is a 54 y.o. female, DOB - 1966-01-10, [id number]  Admit date - 01/16/2020   Admitting Physician Mariel Aloe, MD  Outpatient Primary MD for the patient is Shirline Frees, MD  Outpatient specialists:   LOS - 0  days   Medical records reviewed and are as summarized below:    No chief complaint on file.      Brief summary    Holly [name] is a 54 y.o. female with medical history significant of anxiety, depression, headaches, GERD, Obesity, OSA. Patient presented for an elective cystoscopy with retrograde pyelogram with left ureteroscopy and stent placement. In PACU, she developed significant tachycardia with rates up to 160 bpm. EKG significant for atrial fibrillation with RVR. Cardiology consulted and started patient on a Cardizem drip.  Assessment & Plan    Principal problem Atrial fibrillation with RVR, new diagnosis -Patient was placed on Cardizem drip, admitted to stepdown unit.   -No significant improvement with the diltiazem, hence was discontinued on 4/6 and started on IV amiodarone with loading dose by cardiology. -Continue Eliquis for stroke risk reduction -Heart rate still uncontrolled, 110- 135 elevated -2D echo showed large pericardial effusion, circumferentially, per cardiology, DCCV planned for tomorrow -N.p.o. after midnight   Left nonobstructing renal calculi -Initially planned elective cystoscopy with retrograde pyelogram, stent placement however developed A. fib with RVR in PACU -Overnight had hematuria, will await urology recommendations.  Holding off on acute urological intervention due to #1, uncontrolled A. fib   Diabetes mellitus type 2, IDDM -Continue sliding scale insulin, Lantus -Hemoglobin A1c seven-point  OSA Continue CPAP nightly  Cough variant asthma Continue Flonase,  Singulair  Anxiety Continue Celexa  Hyperlipidemia Continue Lipitor  GERD Continue PPI  Endometriosis -Continue norethindrone  Chronic venous changes Secondary to weight/edema. Associated scaly/dry skin -Eucerin BID -Wound consult for patient education for better management of legs  Chronic headaches -Continue Topamax   Morbid obesity Estimated body mass index is 62.18 kg/m as calculated from the following:   Height as of 01/11/20: 5\' 6"  (1.676 m).   Weight as of 01/11/20: 174.7 kg.  Code Status: DNR DVT Prophylaxis: Apixaban Family Communication: Discussed all imaging results, lab results, explained to the patient    Disposition Plan: Patient from home, anticipate discharge home once atrial fibrillation is controlled, pending DCCV, urology reevaluation   Time Spent in minutes 35 minutes Procedures:  2D echo Consultants:   Cardiology Urology Antimicrobials:   Anti-infectives (From admission, onward)   Start     Dose/Rate Route Frequency Ordered Stop   01/16/20 0911  cefTRIAXone (ROCEPHIN) 2 g in sodium chloride 0.9 % 100 mL IVPB  Status:  Discontinued     2 g 200 mL/hr over 30 Minutes Intravenous 30 min pre-op 01/16/20 0911 01/16/20 1710          Medications  Scheduled Meds: . apixaban  5 mg Oral BID  . atorvastatin  10 mg Oral QHS  . calcium carbonate  1  tablet Oral BID WC  . Chlorhexidine Gluconate Cloth  6 each Topical Daily  . cholecalciferol  1,000 Units Oral BID  . citalopram  40 mg Oral QHS  . darifenacin  15 mg Oral Daily  . ferrous sulfate  325 mg Oral Q breakfast  . hydrocerin   Topical BID  . insulin aspart  0-15 Units Subcutaneous TID WC  . insulin aspart  0-5 Units Subcutaneous QHS  . insulin glargine  30 Units Subcutaneous QHS  . loratadine  10 mg Oral QHS  . mouth rinse  15 mL Mouth Rinse BID  . metoprolol tartrate  25 mg Oral BID  . montelukast  10 mg Oral QHS  . norethindrone  20 mg Oral Daily  . pantoprazole  40 mg Oral  Daily  . topiramate  150 mg Oral QPM  . vitamin B-12  1,000 mcg Oral Daily   Continuous Infusions: . amiodarone 30 mg/hr (01/17/20 0859)  . lactated ringers 50 mL/hr at 01/17/20 0600   PRN Meds:.acetaminophen **OR** acetaminophen, fluticasone, HYDROcodone-acetaminophen      Subjective:   Holly [name] was seen and examined today.  Heart rate still not controlled, somewhat anxious.  No fevers or chills.  Overnight had some hematuria. Patient denies dizziness, chest pain, shortness of breath, new weakness, numbess, tingling.   Objective:   Vitals:   01/17/20 1000 01/17/20 1100 01/17/20 1200 01/17/20 1300  BP: (!) 157/141 (!) 147/103 (!) 145/107 (!) 145/122  Pulse: 78 (!) 121 (!) 35 (!) 122  Resp: (!) 25 (!) 23 (!) 22 (!) 23  Temp:  98.7 F (37.1 C)    TempSrc:  Oral    SpO2: 96% 94% 97% 96%    Intake/Output Summary (Last 24 hours) at 01/17/2020 1324 Last data filed at 01/17/2020 1038 Gross per 24 hour  Intake 1393.25 ml  Output 800 ml  Net 593.25 ml     Wt Readings from Last 3 Encounters:  01/11/20 (!) 174.7 kg  01/10/20 (!) 174.6 kg  02/18/17 (!) 196.4 kg     Exam  General: Alert and oriented x 3, NAD  Cardiovascular: Irregularly irregular, tachycardia  Respiratory: Clear to auscultation bilaterally, no wheezing, rales or rhonchi  Gastrointestinal: Soft, nontender, nondistended, + bowel sounds  Ext: no pedal edema bilaterally  Neuro: No new deficits  Musculoskeletal: No digital cyanosis, clubbing  Skin: No rashes  Psych: Normal affect and demeanor, alert and oriented x3    Data Reviewed:  I have personally reviewed following labs and imaging studies  Micro Results Recent Results (from the past 240 hour(s))  Urine culture     Status: Abnormal   Collection Time: 01/11/20  8:15 AM   Specimen: Urine, Clean Catch  Result Value Ref Range Status   Specimen Description   Final    URINE, CLEAN CATCH Performed at [facility], [place]  [address] [place], [place] [address]    Special Requests   Final    NONE Performed at [facility], [place] [address] [place], [place] [address]    Culture 10,000 COLONIES/mL ENTEROBACTER CLOACAE (A)  Final   Report Status 01/15/2020 FINAL  Final   Organism ID, Bacteria ENTEROBACTER CLOACAE (A)  Final      Susceptibility   Enterobacter cloacae - MIC*    CEFAZOLIN >=64 RESISTANT Resistant     CIPROFLOXACIN <=0.25 SENSITIVE Sensitive     GENTAMICIN <=1 SENSITIVE Sensitive     IMIPENEM <=0.25 SENSITIVE Sensitive     NITROFURANTOIN 64  INTERMEDIATE Intermediate     TRIMETH/SULFA 160 RESISTANT Resistant     PIP/TAZO 32 INTERMEDIATE Intermediate     * 10,000 COLONIES/mL ENTEROBACTER CLOACAE  SARS CORONAVIRUS 2 (TAT 6-24 HRS) Nasopharyngeal Nasopharyngeal Swab     Status: None   Collection Time: 01/12/20  2:16 PM   Specimen: Nasopharyngeal Swab  Result Value Ref Range Status   SARS Coronavirus 2 NEGATIVE NEGATIVE Final    Comment: (NOTE) SARS-CoV-2 target nucleic acids are NOT DETECTED. The SARS-CoV-2 RNA is generally detectable in upper and lower respiratory specimens during the acute phase of infection. Negative results do not preclude SARS-CoV-2 infection, do not rule out co-infections with other pathogens, and should not be used as the sole basis for treatment or other patient management decisions. Negative results must be combined with clinical observations, patient history, and epidemiological information. The expected result is Negative. Fact Sheet for Patients: SugarRoll.be Fact Sheet for Healthcare Providers: https://www.woods-mathews.com/ This test is not yet approved or cleared by the [place] FDA and  has been authorized for detection and/or diagnosis of SARS-CoV-2 by FDA under an Emergency Use Authorization (EUA). This EUA will remain  in effect (meaning this test can be used) for the duration of the COVID-19  declaration under Section 56 4(b)(1) of the Act, 21 U.S.C. section 360bbb-3(b)(1), unless the authorization is terminated or revoked sooner. Performed at [facility], [place] [address], [place], [place] [address]   MRSA PCR Screening     Status: None   Collection Time: 01/16/20  6:12 PM   Specimen: Nasopharyngeal  Result Value Ref Range Status   MRSA by PCR NEGATIVE NEGATIVE Final    Comment:        The GeneXpert MRSA Assay (FDA approved for NASAL specimens only), is one component of a comprehensive MRSA colonization surveillance program. It is not intended to diagnose MRSA infection nor to guide or monitor treatment for MRSA infections. Performed at [facility], [place] [address], [place], [place] [address]     Radiology Reports ECHOCARDIOGRAM LIMITED  Result Date: 01/17/2020    ECHOCARDIOGRAM LIMITED REPORT   Patient Name:   LITAL ZAPP Date of Exam: 01/17/2020 Medical Rec #:  [id number]        Height:       66.0 in Accession #:    [id number]       Weight:       385.2 lb Date of Birth:  10/04/1966        BSA:          2.642 m Patient Age:    73 years         BP:           151/117 mmHg Patient Gender: F                HR:           125 bpm. Exam Location:  Inpatient Procedure: Limited Echo, Intracardiac Opacification Agent, Cardiac Doppler and            Color Doppler Indications:    Atrial fibrillation  History:        Patient has no prior history of Echocardiogram examinations.                 Morbid obesity, Arrythmias:Atrial Fibrillation; Risk                 Factors:Sleep Apnea and Diabetes. Preop for kidney stones.  Sonographer:    Dustin Flock  Referring Phys: NO:[id number] RALPH A NETTEY  Sonographer Comments: Technically difficult study due to poor echo windows and patient is morbidly obese. Image acquisition challenging due to patient body habitus. IMPRESSIONS  1. Very limited echo with poor windows, Definity contrast given - there appears to be moderate  systolic dysfunction with large pericardial effusion. No prior study for comparison.  2. Left ventricular ejection fraction, by estimation, is 35 to 40%. The left ventricle has moderately decreased function. The left ventricle demonstrates global hypokinesis. There is moderate left ventricular hypertrophy. Left ventricular diastolic function could not be evaluated.  3. Right ventricular systolic function is mildly reduced. The right ventricular size is normal. Moderately increased right ventricular wall thickness.  4. Large circumferential pericardial effusion measuring about 2.5 cm. There may also be pericaridal fat. Images are limited.  5. Study was not sufficient to exclude possible tamponade physiology - difficult images due to body habitus. Consider repeat study, TEE or right heart cath to further evaluate.. Large pericardial effusion. The pericardial effusion is circumferential.  6. The inferior vena cava is dilated in size with <50% respiratory variability, suggesting right atrial pressure of 15 mmHg.  7. Left atrial size was moderately dilated. FINDINGS  Left Ventricle: Left ventricular ejection fraction, by estimation, is 35 to 40%. The left ventricle has moderately decreased function. The left ventricle demonstrates global hypokinesis. Definity contrast agent was given IV to delineate the left ventricular endocardial borders. There is moderate left ventricular hypertrophy. Left ventricular diastolic function could not be evaluated due to atrial fibrillation. Right Ventricle: The right ventricular size is normal. Moderately increased right ventricular wall thickness. Right ventricular systolic function is mildly reduced. Left Atrium: Left atrial size was moderately dilated. Pericardium: Study was not sufficient to exclude possible tamponade physiology - difficult images due to body habitus. Consider repeat study, TEE or right heart cath to further evaluate. A large pericardial effusion is present. The  pericardial effusion is circumferential. The pericardial effusion appears to contain mixed echogenic material. Venous: The inferior vena cava is dilated in size with less than 50% respiratory variability, suggesting right atrial pressure of 15 mmHg.  LEFT VENTRICLE PLAX 2D LVIDd:         4.00 cm Diastology LVIDs:         3.00 cm LV e' lateral:   5.98 cm/s LV PW:         1.40 cm LV E/e' lateral: 16.5 LV IVS:        1.30 cm LV e' medial:    5.98 cm/s                        LV E/e' medial:  16.5  MITRAL VALVE MV Area (PHT): 12.04 cm MV Decel Time: 63 msec MV E velocity: 98.50 cm/s MV A velocity: 46.50 cm/s MV E/A ratio:  2.12 Lyman Bishop MD Electronically signed by Lyman Bishop MD Signature Date/Time: 01/17/2020/10:42:30 AM    Final     Lab Data:  CBC: Recent Labs  Lab 01/11/20 0815 01/16/20 1448 01/17/20 0234  WBC 10.2 10.8* 10.2  HGB 15.0 14.1 13.6  HCT 49.8* 46.0 43.7  MCV 86.8 84.7 85.2  PLT 289 268 0000000   Basic Metabolic Panel: Recent Labs  Lab 01/11/20 0815 01/16/20 1448 01/17/20 0234  NA 139 141 141  K 4.6 4.0 4.0  CL 105 109 108  CO2 23 22 23   GLUCOSE 133* 125* 174*  BUN 20 16 16   CREATININE 1.17* 0.94 1.02*  CALCIUM 9.3 9.4 9.3  MG  --  1.6*  --    GFR: Estimated Creatinine Clearance: 108.7 mL/min (A) (by C-G formula based on SCr of 1.02 mg/dL (H)). Liver Function Tests: No results for input(s): AST, ALT, ALKPHOS, BILITOT, PROT, ALBUMIN in the last 168 hours. No results for input(s): LIPASE, AMYLASE in the last 168 hours. No results for input(s): AMMONIA in the last 168 hours. Coagulation Profile: No results for input(s): INR, PROTIME in the last 168 hours. Cardiac Enzymes: No results for input(s): CKTOTAL, CKMB, CKMBINDEX, TROPONINI in the last 168 hours. BNP (last 3 results) No results for input(s): PROBNP in the last 8760 hours. HbA1C: No results for input(s): HGBA1C in the last 72 hours. CBG: Recent Labs  Lab 01/16/20 1158 01/16/20 1321 01/16/20 2025  01/17/20 0723 01/17/20 1107  GLUCAP 108* 133* 158* 161* 156*   Lipid Profile: No results for input(s): CHOL, HDL, LDLCALC, TRIG, CHOLHDL, LDLDIRECT in the last 72 hours. Thyroid Function Tests: Recent Labs    01/16/20 1448  TSH 2.116   Anemia Panel: No results for input(s): VITAMINB12, FOLATE, FERRITIN, TIBC, IRON, RETICCTPCT in the last 72 hours. Urine analysis:    Component Value Date/Time   COLORURINE YELLOW 08/31/2007 [place] 08/31/2007 1343   LABSPEC 1.013 08/31/2007 1343   PHURINE 7.5 08/31/2007 1343   GLUCOSEU NEGATIVE 08/31/2007 1343   HGBUR NEGATIVE 08/31/2007 1343   BILIRUBINUR NEGATIVE 08/31/2007 1343   KETONESUR NEGATIVE 08/31/2007 1343   PROTEINUR NEGATIVE 08/31/2007 1343   UROBILINOGEN 0.2 08/31/2007 1343   NITRITE NEGATIVE 08/31/2007 1343   LEUKOCYTESUR  08/31/2007 1343    NEGATIVE MICROSCOPIC NOT DONE ON URINES WITH NEGATIVE PROTEIN, BLOOD, LEUKOCYTES, NITRITE, OR GLUCOSE <1000 mg/dL.     Estill Cotta M.D. [facility] 01/17/2020, 1:24 PM   Call night coverage person covering after 7pm

## 2020-01-18 ENCOUNTER — Encounter (HOSPITAL_COMMUNITY)
Admission: RE | Disposition: A | Discharge status: Home / Self Care | Payer: Self-pay | Source: Home / Self Care | Attending: Internal Medicine

## 2020-01-18 ENCOUNTER — Inpatient Hospital Stay (HOSPITAL_COMMUNITY): Payer: Medicare Other

## 2020-01-18 LAB — CBC
HCT: 48.1 % — ABNORMAL HIGH (ref 36.0–46.0)
Hemoglobin: 15.2 g/dL — ABNORMAL HIGH (ref 12.0–15.0)
MCH: 26.3 pg (ref 26.0–34.0)
MCHC: 31.6 g/dL (ref 30.0–36.0)
MCV: 83.2 fL (ref 80.0–100.0)
Platelets: 248 10*3/uL (ref 150–400)
RBC: 5.78 MIL/uL — ABNORMAL HIGH (ref 3.87–5.11)
RDW: 16.4 % — ABNORMAL HIGH (ref 11.5–15.5)
WBC: 10.2 10*3/uL (ref 4.0–10.5)
nRBC: 0 % (ref 0.0–0.2)

## 2020-01-18 LAB — LIPID PANEL
Cholesterol: 107 mg/dL (ref 0–200)
HDL: 20 mg/dL — ABNORMAL LOW (ref >40)
LDL Cholesterol: 65 mg/dL (ref 0–99)
Total CHOL/HDL Ratio: 5.4 RATIO
Triglycerides: 112 mg/dL (ref <150)
VLDL: 22 mg/dL (ref 0–40)

## 2020-01-18 LAB — BASIC METABOLIC PANEL
Anion gap: 6 (ref 5–15)
BUN: 14 mg/dL (ref 6–20)
CO2: 21 mmol/L — ABNORMAL LOW (ref 22–32)
Calcium: 9.2 mg/dL (ref 8.9–10.3)
Chloride: 111 mmol/L (ref 98–111)
Creatinine, Ser: 1.15 mg/dL — ABNORMAL HIGH (ref 0.44–1.00)
GFR calc Af Amer: >60 mL/min (ref >60)
GFR calc non Af Amer: 55 mL/min — ABNORMAL LOW (ref >60)
Glucose, Bld: 139 mg/dL — ABNORMAL HIGH (ref 70–99)
Potassium: 4.7 mmol/L (ref 3.5–5.1)
Sodium: 138 mmol/L (ref 135–145)

## 2020-01-18 LAB — GLUCOSE, CAPILLARY
Glucose-Capillary: 141 mg/dL — ABNORMAL HIGH (ref 70–99)
Glucose-Capillary: 139 mg/dL — ABNORMAL HIGH (ref 70–99)
Glucose-Capillary: 164 mg/dL — ABNORMAL HIGH (ref 70–99)

## 2020-01-18 LAB — MAGNESIUM: Magnesium: 1.7 mg/dL (ref 1.7–2.4)

## 2020-01-18 LAB — HIGH SENSITIVITY CRP: CRP, High Sensitivity: 11.55 mg/L — ABNORMAL HIGH (ref 0.00–3.00)

## 2020-01-18 SURGERY — ECHOCARDIOGRAM, TRANSESOPHAGEAL
Anesthesia: Monitor Anesthesia Care

## 2020-01-18 MED ORDER — METOPROLOL TARTRATE 25 MG PO TABS
25.0000 mg | ORAL_TABLET | Freq: Four times a day (QID) | ORAL | Status: DC
  Administered 2020-01-18 – 2020-01-19 (×4): 25 mg via ORAL
  Filled 2020-01-18 (×3): qty 1

## 2020-01-18 MED ORDER — CAMPHOR-MENTHOL 0.5-0.5 % EX LOTN
TOPICAL_LOTION | CUTANEOUS | Status: DC | PRN
  Filled 2020-01-18: qty 222

## 2020-01-18 MED ORDER — DIPHENHYDRAMINE HCL 25 MG PO CAPS: 25.0000 mg | ORAL_CAPSULE | Freq: Three times a day (TID) | ORAL | Status: DC | PRN

## 2020-01-18 NOTE — H&P (View-Only) (Signed)
Progress Note  Patient Name: Holly [name] Date of Encounter: 01/18/2020  Primary Cardiologist: Fransico Him, MD   Subjective   Moderate to large pericardial effusion seen on echo yesterday. EF 35-40%. No signs of tamponade. Patient remains in afib with rates around 100bpm.   Patient denies chest pain or palpitations. She is still having hematuria.  Inpatient Medications    Scheduled Meds: . apixaban  5 mg Oral BID  . atorvastatin  10 mg Oral QHS  . calcium carbonate  1 tablet Oral BID WC  . Chlorhexidine Gluconate Cloth  6 each Topical Daily  . cholecalciferol  1,000 Units Oral BID  . citalopram  40 mg Oral QHS  . darifenacin  15 mg Oral Daily  . ferrous sulfate  325 mg Oral Q breakfast  . hydrocerin   Topical BID  . insulin aspart  0-15 Units Subcutaneous TID WC  . insulin aspart  0-5 Units Subcutaneous QHS  . insulin glargine  30 Units Subcutaneous QHS  . loratadine  10 mg Oral QHS  . mouth rinse  15 mL Mouth Rinse BID  . metoprolol tartrate  25 mg Oral BID  . montelukast  10 mg Oral QHS  . norethindrone  20 mg Oral Daily  . pantoprazole  40 mg Oral Daily  . topiramate  150 mg Oral QPM  . vitamin B-12  1,000 mcg Oral Daily   Continuous Infusions: . amiodarone 30 mg/hr (01/18/20 0600)  . lactated ringers 50 mL/hr at 01/18/20 0600   PRN Meds: acetaminophen **OR** acetaminophen, fluticasone, HYDROcodone-acetaminophen   Vital Signs    Vitals:   01/18/20 0337 01/18/20 0400 01/18/20 0500 01/18/20 0600  BP:  (!) 149/133 (!) 156/101 129/88  Pulse:  (!) 30 (!) 39 87  Resp:  16 19 17   Temp: 98.1 F (36.7 C)     TempSrc: Oral     SpO2:  96% 98% 95%  Weight:      Height:        Intake/Output Summary (Last 24 hours) at 01/18/2020 0836 Last data filed at 01/18/2020 0600 Gross per 24 hour  Intake 1679.31 ml  Output 1250 ml  Net 429.31 ml   Last 3 Weights 01/17/2020 01/11/2020 01/10/2020  Weight (lbs) 386 lb 3.9 oz 385 lb 4 oz 385 lb  Weight (kg) 175.2 kg 174.748 kg  174.635 kg      Telemetry    Afib, HR around 100 - Personally Reviewed  ECG    No new - Personally Reviewed  Physical Exam   GEN: No acute distress.   Neck: No JVD Cardiac: Irreg Irreg, tachy, no murmurs, rubs, or gallops.  Respiratory: Clear to auscultation bilaterally. GI: Soft, nontender, non-distended  MS: No edema; No deformity. Neuro:  Nonfocal  Psych: Normal affect   Labs    High Sensitivity Troponin:  No results for input(s): TROPONINIHS in the last 720 hours.    Chemistry Recent Labs  Lab 01/16/20 1448 01/17/20 0234  NA 141 141  K 4.0 4.0  CL 109 108  CO2 22 23  GLUCOSE 125* 174*  BUN 16 16  CREATININE 0.94 1.02*  CALCIUM 9.4 9.3  GFRNONAA >60 >60  GFRAA >60 >60  ANIONGAP 10 10     Hematology Recent Labs  Lab 01/16/20 1448 01/17/20 0234  WBC 10.8* 10.2  RBC 5.43* 5.13*  HGB 14.1 13.6  HCT 46.0 43.7  MCV 84.7 85.2  MCH 26.0 26.5  MCHC 30.7 31.1  RDW 16.2* 16.2*  PLT  268 243    BNPNo results for input(s): BNP, PROBNP in the last 168 hours.   DDimer No results for input(s): DDIMER in the last 168 hours.   Radiology    ECHOCARDIOGRAM LIMITED  Result Date: 01/17/2020    ECHOCARDIOGRAM LIMITED REPORT   Patient Name:   Holly [name] Date of Exam: 01/17/2020 Medical Rec #:  [id number]        Height:       66.0 in Accession #:    [id number]       Weight:       386.2 lb Date of Birth:  [birth year]        BSA:          2.645 m Patient Age:    54 years         BP:           148/130 mmHg Patient Gender: F                HR:           111 bpm. Exam Location:  Inpatient Procedure: Limited Echo and Cardiac Doppler Indications:    Pericardial effusion 423.9/I31.3  History:        Patient has no prior history of Echocardiogram examinations and                 Patient has prior history of Echocardiogram examinations, most                 recent 01/17/2020. Arrythmias:Atrial Fibrillation; Risk                 Factors:Diabetes and Sleep Apnea.  Sonographer:     Clayton Lefort RDCS (AE) Referring Phys: [id number] Athira Janowicz H Jontae Sonier  Sonographer Comments: Technically difficult study due to poor echo windows and patient is morbidly obese. Image acquisition challenging due to patient body habitus. IMPRESSIONS  1. RV cavity appears small, no clear diastolic collapse. Right ventricular systolic function is normal. The right ventricular size is normal.  2. Promient apical fat pad is noted. moderate to large circumferential pericardial effusion (measuring between 1.8-2.4 cm).  3. No clear significant respiratory inflow variation.  4. Borderline tricuspid inflow variation with respiration. Comparison(s): Prior images reviewed side by side. 01/17/20: LVEF 35-40%, large pericardial effusion, dilated IVC. Conclusion(s)/Recommendation(s): Comparing both sets of images, there is a moderate to large (>2.0 cm) circumferential pericardial effusion, as well as a promient apical fat pad. The IVC is diltated and there is minimal tricuspid, but no significant mitral inflow variation with respiration. The RV appears normal diastolic collapse is not noted. Tamponade physiology is difficult to ascertain in the setting of cardiomyopathy, but not suspected. Vitals indicate significant hypertension, which does not suggest tamponade. Clinical correlation is always recommended. FINDINGS  Right Ventricle: RV cavity appears small, no clear diastolic collapse. The right ventricular size is normal. No increase in right ventricular wall thickness. Right ventricular systolic function is normal. Pericardium: Promient apical fat pad is noted. Moderate to large circumferential pericardial effusion (measuring between 1.8-2.4 cm). Presence of pericardial fat pad. Mitral Valve: No clear significant respiratory inflow variation. Tricuspid Valve: Borderline tricuspid inflow variation with respiration. Lyman Bishop MD Electronically signed by Lyman Bishop MD Signature Date/Time: 01/17/2020/7:56:35 PM    Final     ECHOCARDIOGRAM LIMITED  Result Date: 01/17/2020    ECHOCARDIOGRAM LIMITED REPORT   Patient Name:   Holly [name] Date of Exam: 01/17/2020 Medical Rec #:  [id number]  Height:       66.0 in Accession #:    [id number]       Weight:       385.2 lb Date of Birth:  Jun 01, 1966        BSA:          2.642 m Patient Age:    75 years         BP:           151/117 mmHg Patient Gender: F                HR:           125 bpm. Exam Location:  Inpatient Procedure: Limited Echo, Intracardiac Opacification Agent, Cardiac Doppler and            Color Doppler Indications:    Atrial fibrillation  History:        Patient has no prior history of Echocardiogram examinations.                 Morbid obesity, Arrythmias:Atrial Fibrillation; Risk                 Factors:Sleep Apnea and Diabetes. Preop for kidney stones.  Sonographer:    Dustin Flock Referring Phys: [phone] RALPH A NETTEY  Sonographer Comments: Technically difficult study due to poor echo windows and patient is morbidly obese. Image acquisition challenging due to patient body habitus. IMPRESSIONS  1. Very limited echo with poor windows, Definity contrast given - there appears to be moderate systolic dysfunction with large pericardial effusion. No prior study for comparison.  2. Left ventricular ejection fraction, by estimation, is 35 to 40%. The left ventricle has moderately decreased function. The left ventricle demonstrates global hypokinesis. There is moderate left ventricular hypertrophy. Left ventricular diastolic function could not be evaluated.  3. Right ventricular systolic function is mildly reduced. The right ventricular size is normal. Moderately increased right ventricular wall thickness.  4. Large circumferential pericardial effusion measuring about 2.5 cm. There may also be pericaridal fat. Images are limited.  5. Study was not sufficient to exclude possible tamponade physiology - difficult images due to body habitus. Consider repeat study, TEE or  right heart cath to further evaluate.. Large pericardial effusion. The pericardial effusion is circumferential.  6. The inferior vena cava is dilated in size with <50% respiratory variability, suggesting right atrial pressure of 15 mmHg.  7. Left atrial size was moderately dilated. FINDINGS  Left Ventricle: Left ventricular ejection fraction, by estimation, is 35 to 40%. The left ventricle has moderately decreased function. The left ventricle demonstrates global hypokinesis. Definity contrast agent was given IV to delineate the left ventricular endocardial borders. There is moderate left ventricular hypertrophy. Left ventricular diastolic function could not be evaluated due to atrial fibrillation. Right Ventricle: The right ventricular size is normal. Moderately increased right ventricular wall thickness. Right ventricular systolic function is mildly reduced. Left Atrium: Left atrial size was moderately dilated. Pericardium: Study was not sufficient to exclude possible tamponade physiology - difficult images due to body habitus. Consider repeat study, TEE or right heart cath to further evaluate. A large pericardial effusion is present. The pericardial effusion is circumferential. The pericardial effusion appears to contain mixed echogenic material. Venous: The inferior vena cava is dilated in size with less than 50% respiratory variability, suggesting right atrial pressure of 15 mmHg.  LEFT VENTRICLE PLAX 2D LVIDd:         4.00 cm Diastology LVIDs:  3.00 cm LV e' lateral:   5.98 cm/s LV PW:         1.40 cm LV E/e' lateral: 16.5 LV IVS:        1.30 cm LV e' medial:    5.98 cm/s                        LV E/e' medial:  16.5  MITRAL VALVE MV Area (PHT): 12.04 cm MV Decel Time: 63 msec MV E velocity: 98.50 cm/s MV A velocity: 46.50 cm/s MV E/A ratio:  2.12 Lyman Bishop MD Electronically signed by Lyman Bishop MD Signature Date/Time: 01/17/2020/10:42:30 AM    Final     Cardiac Studies   Echo  1. Very limited  echo with poor windows, Definity contrast given - there  appears to be moderate systolic dysfunction with large pericardial  effusion. No prior study for comparison.  2. Left ventricular ejection fraction, by estimation, is 35 to 40%. The  left ventricle has moderately decreased function. The left ventricle  demonstrates global hypokinesis. There is moderate left ventricular  hypertrophy. Left ventricular diastolic  function could not be evaluated.  3. Right ventricular systolic function is mildly reduced. The right  ventricular size is normal. Moderately increased right ventricular wall  thickness.  4. Large circumferential pericardial effusion measuring about 2.5 cm.  There may also be pericaridal fat. Images are limited.  5. Study was not sufficient to exclude possible tamponade physiology -  difficult images due to body habitus. Consider repeat study, TEE or right  heart cath to further evaluate.. Large pericardial effusion. The  pericardial effusion is circumferential.  6. The inferior vena cava is dilated in size with <50% respiratory  variability, suggesting right atrial pressure of 15 mmHg.  7. Left atrial size was moderately dilated.   Limited Echo  1. RV cavity appears small, no clear diastolic collapse. Right  ventricular systolic function is normal. The right ventricular size is  normal.  2. Promient apical fat pad is noted. moderate to large circumferential  pericardial effusion (measuring between 1.8-2.4 cm).  3. No clear significant respiratory inflow variation.  4. Borderline tricuspid inflow variation with respiration.   Comparison(s): Prior images reviewed side by side. 01/17/20: LVEF 35-40%,  large pericardial effusion, dilated IVC.   Conclusion(s)/Recommendation(s): Comparing both sets of images, there is a  moderate to large (>2.0 cm) circumferential pericardial effusion, as well  as a promient apical fat pad. The IVC is diltated and there is minimal   tricuspid, but no significant  mitral inflow variation with respiration. The RV appears normal diastolic  collapse is not noted. Tamponade physiology is difficult to ascertain in  the setting of cardiomyopathy, but not suspected. Vitals indicate  significant hypertension, which does not  suggest tamponade. Clinical correlation is always recommended.   Patient Profile     54 y.o. female with a hx of severeosteoarthritis B/Lknees,obesity,diabetes,chronic back pain, HLD, andDepression/anxietywho is being seen today for the evaluation of Afib RVR. Echo showed EF 35-40% with pericardial effusion.   Assessment & Plan    New onset Afib RVR Patient arrived to her scheduled procedure forleft calculus/severe back painand converted to afib RVR in the OR. - No prior history of afib and patient is asymptomatic - CHADSVASC = 2 (female, DM)  - started on Eliquis 5mg  BID.  - IV dilt started for rate control>>switched to amiodarone   - Rates are now around 100 - Echo showed EF 35-40%, global hypokinesis, mildly  reduced RV function and large pericardial effusion - potassium 4.0 - Mag 1.6 goal >2 - TSH 2.11 - Plan for DCCV, however might need to consult with urology given hematuria if OK to proceed with 4 weeks of uninterrupted anticoagulation. Will discuss plan with MD  Pericardial effusion - repeat limited echo showed moderate to large pericardial effusion and significant signs of tamponade.  - BP stable and HR around 100 - Sed rate 28 - CRP 3.7 - continue to monitor signs of tamponade - Might need RHC to further investigate  Cardiomyopathy - EF estimated at 35-40% - patient will likely need heart cath  Left renal calculus - planned for elective procedureureteroscopy with laser lithotripsy and retrograde pyelogram and possible stent placement>>held for the above - plan to proceed once patient is stable  - Still has Hematuria >>would consult urology - CBC pending  HLD - at  baseline is on atorvastatin 10 mg daily - check lipids  DM2 on insulin - A1C 7.5% 01/11/20  For questions or updates, please contact [place] Please consult [email or web address] for contact info under        Signed, Rukia Mcgillivray Ninfa Meeker, PA-C  01/18/2020, 8:36 AM

## 2020-01-18 NOTE — Progress Notes (Signed)
Progress Note  Patient Name: Holly [name] Date of Encounter: 01/18/2020  Primary Cardiologist: Fransico Him, MD   Subjective   Moderate to large pericardial effusion seen on echo yesterday. EF 35-40%. No signs of tamponade. Patient remains in afib with rates around 100bpm.   Patient denies chest pain or palpitations. She is still having hematuria.  Inpatient Medications    Scheduled Meds: . apixaban  5 mg Oral BID  . atorvastatin  10 mg Oral QHS  . calcium carbonate  1 tablet Oral BID WC  . Chlorhexidine Gluconate Cloth  6 each Topical Daily  . cholecalciferol  1,000 Units Oral BID  . citalopram  40 mg Oral QHS  . darifenacin  15 mg Oral Daily  . ferrous sulfate  325 mg Oral Q breakfast  . hydrocerin   Topical BID  . insulin aspart  0-15 Units Subcutaneous TID WC  . insulin aspart  0-5 Units Subcutaneous QHS  . insulin glargine  30 Units Subcutaneous QHS  . loratadine  10 mg Oral QHS  . mouth rinse  15 mL Mouth Rinse BID  . metoprolol tartrate  25 mg Oral BID  . montelukast  10 mg Oral QHS  . norethindrone  20 mg Oral Daily  . pantoprazole  40 mg Oral Daily  . topiramate  150 mg Oral QPM  . vitamin B-12  1,000 mcg Oral Daily   Continuous Infusions: . amiodarone 30 mg/hr (01/18/20 0600)  . lactated ringers 50 mL/hr at 01/18/20 0600   PRN Meds: acetaminophen **OR** acetaminophen, fluticasone, HYDROcodone-acetaminophen   Vital Signs    Vitals:   01/18/20 0337 01/18/20 0400 01/18/20 0500 01/18/20 0600  BP:  (!) 149/133 (!) 156/101 129/88  Pulse:  (!) 30 (!) 39 87  Resp:  16 19 17   Temp: 98.1 F (36.7 C)     TempSrc: Oral     SpO2:  96% 98% 95%  Weight:      Height:        Intake/Output Summary (Last 24 hours) at 01/18/2020 0836 Last data filed at 01/18/2020 0600 Gross per 24 hour  Intake 1679.31 ml  Output 1250 ml  Net 429.31 ml   Last 3 Weights 01/17/2020 01/11/2020 01/10/2020  Weight (lbs) 386 lb 3.9 oz 385 lb 4 oz 385 lb  Weight (kg) 175.2 kg 174.748 kg  174.635 kg      Telemetry    Afib, HR around 100 - Personally Reviewed  ECG    No new - Personally Reviewed  Physical Exam   GEN: No acute distress.   Neck: No JVD Cardiac: Irreg Irreg, tachy, no murmurs, rubs, or gallops.  Respiratory: Clear to auscultation bilaterally. GI: Soft, nontender, non-distended  MS: No edema; No deformity. Neuro:  Nonfocal  Psych: Normal affect   Labs    High Sensitivity Troponin:  No results for input(s): TROPONINIHS in the last 720 hours.    Chemistry Recent Labs  Lab 01/16/20 1448 01/17/20 0234  NA 141 141  K 4.0 4.0  CL 109 108  CO2 22 23  GLUCOSE 125* 174*  BUN 16 16  CREATININE 0.94 1.02*  CALCIUM 9.4 9.3  GFRNONAA >60 >60  GFRAA >60 >60  ANIONGAP 10 10     Hematology Recent Labs  Lab 01/16/20 1448 01/17/20 0234  WBC 10.8* 10.2  RBC 5.43* 5.13*  HGB 14.1 13.6  HCT 46.0 43.7  MCV 84.7 85.2  MCH 26.0 26.5  MCHC 30.7 31.1  RDW 16.2* 16.2*  PLT  268 243    BNPNo results for input(s): BNP, PROBNP in the last 168 hours.   DDimer No results for input(s): DDIMER in the last 168 hours.   Radiology    ECHOCARDIOGRAM LIMITED  Result Date: 01/17/2020    ECHOCARDIOGRAM LIMITED REPORT   Patient Name:   Holly [name] Date of Exam: 01/17/2020 Medical Rec #:  [id number]        Height:       66.0 in Accession #:    [id number]       Weight:       386.2 lb Date of Birth:  1966-09-21        BSA:          2.645 m Patient Age:    54 years         BP:           148/130 mmHg Patient Gender: F                HR:           111 bpm. Exam Location:  Inpatient Procedure: Limited Echo and Cardiac Doppler Indications:    Pericardial effusion 423.9/I31.3  History:        Patient has no prior history of Echocardiogram examinations and                 Patient has prior history of Echocardiogram examinations, most                 recent 01/17/2020. Arrythmias:Atrial Fibrillation; Risk                 Factors:Diabetes and Sleep Apnea.  Sonographer:     Clayton Lefort RDCS (AE) Referring Phys: [id number] Chinedum Vanhouten H Lillan Mccreadie  Sonographer Comments: Technically difficult study due to poor echo windows and patient is morbidly obese. Image acquisition challenging due to patient body habitus. IMPRESSIONS  1. RV cavity appears small, no clear diastolic collapse. Right ventricular systolic function is normal. The right ventricular size is normal.  2. Promient apical fat pad is noted. moderate to large circumferential pericardial effusion (measuring between 1.8-2.4 cm).  3. No clear significant respiratory inflow variation.  4. Borderline tricuspid inflow variation with respiration. Comparison(s): Prior images reviewed side by side. 01/17/20: LVEF 35-40%, large pericardial effusion, dilated IVC. Conclusion(s)/Recommendation(s): Comparing both sets of images, there is a moderate to large (>2.0 cm) circumferential pericardial effusion, as well as a promient apical fat pad. The IVC is diltated and there is minimal tricuspid, but no significant mitral inflow variation with respiration. The RV appears normal diastolic collapse is not noted. Tamponade physiology is difficult to ascertain in the setting of cardiomyopathy, but not suspected. Vitals indicate significant hypertension, which does not suggest tamponade. Clinical correlation is always recommended. FINDINGS  Right Ventricle: RV cavity appears small, no clear diastolic collapse. The right ventricular size is normal. No increase in right ventricular wall thickness. Right ventricular systolic function is normal. Pericardium: Promient apical fat pad is noted. Moderate to large circumferential pericardial effusion (measuring between 1.8-2.4 cm). Presence of pericardial fat pad. Mitral Valve: No clear significant respiratory inflow variation. Tricuspid Valve: Borderline tricuspid inflow variation with respiration. Lyman Bishop MD Electronically signed by Lyman Bishop MD Signature Date/Time: 01/17/2020/7:56:35 PM    Final     ECHOCARDIOGRAM LIMITED  Result Date: 01/17/2020    ECHOCARDIOGRAM LIMITED REPORT   Patient Name:   Holly [name] Date of Exam: 01/17/2020 Medical Rec #:  [id number]  Height:       66.0 in Accession #:    [id number]       Weight:       385.2 lb Date of Birth:  [birth year]        BSA:          2.642 m Patient Age:    89 years         BP:           151/117 mmHg Patient Gender: F                HR:           125 bpm. Exam Location:  Inpatient Procedure: Limited Echo, Intracardiac Opacification Agent, Cardiac Doppler and            Color Doppler Indications:    Atrial fibrillation  History:        Patient has no prior history of Echocardiogram examinations.                 Morbid obesity, Arrythmias:Atrial Fibrillation; Risk                 Factors:Sleep Apnea and Diabetes. Preop for kidney stones.  Sonographer:    Dustin Flock Referring Phys: [phone] RALPH A NETTEY  Sonographer Comments: Technically difficult study due to poor echo windows and patient is morbidly obese. Image acquisition challenging due to patient body habitus. IMPRESSIONS  1. Very limited echo with poor windows, Definity contrast given - there appears to be moderate systolic dysfunction with large pericardial effusion. No prior study for comparison.  2. Left ventricular ejection fraction, by estimation, is 35 to 40%. The left ventricle has moderately decreased function. The left ventricle demonstrates global hypokinesis. There is moderate left ventricular hypertrophy. Left ventricular diastolic function could not be evaluated.  3. Right ventricular systolic function is mildly reduced. The right ventricular size is normal. Moderately increased right ventricular wall thickness.  4. Large circumferential pericardial effusion measuring about 2.5 cm. There may also be pericaridal fat. Images are limited.  5. Study was not sufficient to exclude possible tamponade physiology - difficult images due to body habitus. Consider repeat study, TEE or  right heart cath to further evaluate.. Large pericardial effusion. The pericardial effusion is circumferential.  6. The inferior vena cava is dilated in size with <50% respiratory variability, suggesting right atrial pressure of 15 mmHg.  7. Left atrial size was moderately dilated. FINDINGS  Left Ventricle: Left ventricular ejection fraction, by estimation, is 35 to 40%. The left ventricle has moderately decreased function. The left ventricle demonstrates global hypokinesis. Definity contrast agent was given IV to delineate the left ventricular endocardial borders. There is moderate left ventricular hypertrophy. Left ventricular diastolic function could not be evaluated due to atrial fibrillation. Right Ventricle: The right ventricular size is normal. Moderately increased right ventricular wall thickness. Right ventricular systolic function is mildly reduced. Left Atrium: Left atrial size was moderately dilated. Pericardium: Study was not sufficient to exclude possible tamponade physiology - difficult images due to body habitus. Consider repeat study, TEE or right heart cath to further evaluate. A large pericardial effusion is present. The pericardial effusion is circumferential. The pericardial effusion appears to contain mixed echogenic material. Venous: The inferior vena cava is dilated in size with less than 50% respiratory variability, suggesting right atrial pressure of 15 mmHg.  LEFT VENTRICLE PLAX 2D LVIDd:         4.00 cm Diastology LVIDs:  3.00 cm LV e' lateral:   5.98 cm/s LV PW:         1.40 cm LV E/e' lateral: 16.5 LV IVS:        1.30 cm LV e' medial:    5.98 cm/s                        LV E/e' medial:  16.5  MITRAL VALVE MV Area (PHT): 12.04 cm MV Decel Time: 63 msec MV E velocity: 98.50 cm/s MV A velocity: 46.50 cm/s MV E/A ratio:  2.12 Lyman Bishop MD Electronically signed by Lyman Bishop MD Signature Date/Time: 01/17/2020/10:42:30 AM    Final     Cardiac Studies   Echo  1. Very limited  echo with poor windows, Definity contrast given - there  appears to be moderate systolic dysfunction with large pericardial  effusion. No prior study for comparison.  2. Left ventricular ejection fraction, by estimation, is 35 to 40%. The  left ventricle has moderately decreased function. The left ventricle  demonstrates global hypokinesis. There is moderate left ventricular  hypertrophy. Left ventricular diastolic  function could not be evaluated.  3. Right ventricular systolic function is mildly reduced. The right  ventricular size is normal. Moderately increased right ventricular wall  thickness.  4. Large circumferential pericardial effusion measuring about 2.5 cm.  There may also be pericaridal fat. Images are limited.  5. Study was not sufficient to exclude possible tamponade physiology -  difficult images due to body habitus. Consider repeat study, TEE or right  heart cath to further evaluate.. Large pericardial effusion. The  pericardial effusion is circumferential.  6. The inferior vena cava is dilated in size with <50% respiratory  variability, suggesting right atrial pressure of 15 mmHg.  7. Left atrial size was moderately dilated.   Limited Echo  1. RV cavity appears small, no clear diastolic collapse. Right  ventricular systolic function is normal. The right ventricular size is  normal.  2. Promient apical fat pad is noted. moderate to large circumferential  pericardial effusion (measuring between 1.8-2.4 cm).  3. No clear significant respiratory inflow variation.  4. Borderline tricuspid inflow variation with respiration.   Comparison(s): Prior images reviewed side by side. 01/17/20: LVEF 35-40%,  large pericardial effusion, dilated IVC.   Conclusion(s)/Recommendation(s): Comparing both sets of images, there is a  moderate to large (>2.0 cm) circumferential pericardial effusion, as well  as a promient apical fat pad. The IVC is diltated and there is minimal   tricuspid, but no significant  mitral inflow variation with respiration. The RV appears normal diastolic  collapse is not noted. Tamponade physiology is difficult to ascertain in  the setting of cardiomyopathy, but not suspected. Vitals indicate  significant hypertension, which does not  suggest tamponade. Clinical correlation is always recommended.   Patient Profile     54 y.o. female with a hx of severeosteoarthritis B/Lknees,obesity,diabetes,chronic back pain, HLD, andDepression/anxietywho is being seen today for the evaluation of Afib RVR. Echo showed EF 35-40% with pericardial effusion.   Assessment & Plan    New onset Afib RVR Patient arrived to her scheduled procedure forleft calculus/severe back painand converted to afib RVR in the OR. - No prior history of afib and patient is asymptomatic - CHADSVASC = 2 (female, DM)  - started on Eliquis 5mg  BID.  - IV dilt started for rate control>>switched to amiodarone   - Rates are now around 100 - Echo showed EF 35-40%, global hypokinesis, mildly  reduced RV function and large pericardial effusion - potassium 4.0 - Mag 1.6 goal >2 - TSH 2.11 - Plan for DCCV, however might need to consult with urology given hematuria if OK to proceed with 4 weeks of uninterrupted anticoagulation. Will discuss plan with MD  Pericardial effusion - repeat limited echo showed moderate to large pericardial effusion and significant signs of tamponade.  - BP stable and HR around 100 - Sed rate 28 - CRP 3.7 - continue to monitor signs of tamponade - Might need RHC to further investigate  Cardiomyopathy - EF estimated at 35-40% - patient will likely need heart cath  Left renal calculus - planned for elective procedureureteroscopy with laser lithotripsy and retrograde pyelogram and possible stent placement>>held for the above - plan to proceed once patient is stable  - Still has Hematuria >>would consult urology - CBC pending  HLD - at  baseline is on atorvastatin 10 mg daily - check lipids  DM2 on insulin - A1C 7.5% 01/11/20  For questions or updates, please contact [facility] Please consult [email or web address] for contact info under        Signed, Darling Cieslewicz Ninfa Meeker, PA-C  01/18/2020, 8:36 AM

## 2020-01-18 NOTE — Progress Notes (Signed)
Urology Inpatient Progress Report  Atrial fibrillation with RVR ([place]) [I48.91]   History: 54 yo woman found to have a 37mm left non-obstructing renal calculus on CT spine imaging.  She was scheduled for an elective ureteroscopy, laser lithotripy and stent placement on 01/16/20.  After she was moved from stretcher to OR bed, patient developed A fib with RVR.  Her surgery was then cancelled and cardiology was consulted.  Patient is now admitted and started on anti-coagulation with plans cardioversion tomorrow.     Intv/Subj: Patient developed gross hematuria yesterday.     Active Problems:   Atrial fibrillation with RVR (HCC)   OSA on CPAP   Morbid obesity (HCC)   DCM (dilated cardiomyopathy) (HCC)   Pericardial effusion  Current Facility-Administered Medications  Medication Dose Route Frequency Provider Last Rate Last Admin  . acetaminophen (TYLENOL) tablet 650 mg  650 mg Oral Q6H PRN Mariel Aloe, MD   650 mg at 01/17/20 [id number]   Or  . acetaminophen (TYLENOL) suppository 650 mg  650 mg Rectal Q6H PRN Mariel Aloe, MD      . amiodarone (NEXTERONE PREMIX) 360-4.14 MG/200ML-% (1.8 mg/mL) IV infusion  30 mg/hr Intravenous Continuous Reino Bellis B, NP 16.67 mL/hr at 01/18/20 0600 30 mg/hr at 01/18/20 0600  . apixaban (ELIQUIS) tablet 5 mg  5 mg Oral BID Mariel Aloe, MD   5 mg at 01/17/20 2124  . atorvastatin (LIPITOR) tablet 10 mg  10 mg Oral QHS Mariel Aloe, MD   10 mg at 01/17/20 2124  . calcium carbonate (OS-CAL - dosed in mg of elemental calcium) tablet 500 mg of elemental calcium  1 tablet Oral BID WC Mariel Aloe, MD   Stopped at 01/18/20 [phone]  . Chlorhexidine Gluconate Cloth 2 % PADS 6 each  6 each Topical Daily Mariel Aloe, MD   6 each at 01/17/20 1037  . cholecalciferol (VITAMIN D3) tablet 1,000 Units  1,000 Units Oral BID Mariel Aloe, MD   1,000 Units at 01/17/20 2125  . citalopram (CELEXA) tablet 40 mg  40 mg Oral QHS Mariel Aloe, MD   40 mg at 01/17/20  2125  . darifenacin (ENABLEX) 24 hr tablet 15 mg  15 mg Oral Daily Mariel Aloe, MD   15 mg at 01/17/20 0918  . ferrous sulfate tablet 325 mg  325 mg Oral Q breakfast Mariel Aloe, MD   Stopped at 01/18/20 0725  . fluticasone (FLONASE) 50 MCG/ACT nasal spray 2 spray  2 spray Each Nare Daily PRN Mariel Aloe, MD      . hydrocerin (EUCERIN) cream   Topical BID Mariel Aloe, MD   Given at 01/17/20 2125  . HYDROcodone-acetaminophen (NORCO) 10-325 MG per tablet 1 tablet  1 tablet Oral Q6H PRN Mariel Aloe, MD   1 tablet at 01/17/20 2125  . insulin aspart (novoLOG) injection 0-15 Units  0-15 Units Subcutaneous TID WC Mariel Aloe, MD   5 Units at 01/17/20 1708  . insulin aspart (novoLOG) injection 0-5 Units  0-5 Units Subcutaneous QHS Mariel Aloe, MD      . insulin glargine (LANTUS) injection 30 Units  30 Units Subcutaneous QHS Mariel Aloe, MD   30 Units at 01/17/20 2125  . lactated ringers infusion   Intravenous Continuous Mariel Aloe, MD 50 mL/hr at 01/18/20 0600 Rate Verify at 01/18/20 0600  . loratadine (CLARITIN) tablet 10 mg  10 mg Oral QHS Mariel Aloe,  MD   10 mg at 01/17/20 2125  . MEDLINE mouth rinse  15 mL Mouth Rinse BID Mariel Aloe, MD   15 mL at 01/17/20 2228  . metoprolol tartrate (LOPRESSOR) tablet 25 mg  25 mg Oral BID Sueanne Margarita, MD   25 mg at 01/17/20 2124  . montelukast (SINGULAIR) tablet 10 mg  10 mg Oral QHS Mariel Aloe, MD   10 mg at 01/17/20 2124  . norethindrone (AYGESTIN) tablet 20 mg  20 mg Oral Daily Mariel Aloe, MD   20 mg at 01/17/20 0919  . pantoprazole (PROTONIX) EC tablet 40 mg  40 mg Oral Daily Mariel Aloe, MD   40 mg at 01/17/20 [id number]  . topiramate (TOPAMAX) tablet 150 mg  150 mg Oral QPM Mariel Aloe, MD   150 mg at 01/17/20 1716  . vitamin B-12 (CYANOCOBALAMIN) tablet 1,000 mcg  1,000 mcg Oral Daily Mariel Aloe, MD   1,000 mcg at 01/17/20 0918     Objective: Vital: Vitals:   01/18/20 0500 01/18/20 0600  01/18/20 0800 01/18/20 0900  BP: (!) 156/101 129/88 (!) 148/105 (!) 163/100  Pulse: (!) 39 87 85 100  Resp: 19 17 19  (!) 21  Temp:   98.2 F (36.8 C)   TempSrc:   Oral   SpO2: 98% 95% 95% 97%  Weight:      Height:       I/Os: I/O last 3 completed shifts: In: 2635.1 [I.V.:2635.1] Out: 1850 [Urine:1850]  Physical Exam:  General: Patient is in no apparent distress Lungs: Normal respiratory effort, chest expands symmetrically. GI: The abdomen is soft and nontender without mass. Ext: lower extremities symmetric  Lab Results: Recent Labs    01/16/20 1448 01/17/20 0234 01/18/20 0921  WBC 10.8* 10.2 10.2  HGB 14.1 13.6 15.2*  HCT 46.0 43.7 48.1*   Recent Labs    01/16/20 1448 01/17/20 0234 01/18/20 0921  NA 141 141 138  K 4.0 4.0 4.7  CL 109 108 111  CO2 22 23 21*  GLUCOSE 125* 174* 139*  BUN 16 16 14   CREATININE 0.94 1.02* 1.15*  CALCIUM 9.4 9.3 9.2   No results for input(s): LABPT, INR in the last 72 hours. No results for input(s): LABURIN in the last 72 hours. Results for orders placed or performed during the hospital encounter of 01/16/20  MRSA PCR Screening     Status: None   Collection Time: 01/16/20  6:12 PM   Specimen: Nasopharyngeal  Result Value Ref Range Status   MRSA by PCR NEGATIVE NEGATIVE Final    Comment:        The GeneXpert MRSA Assay (FDA approved for NASAL specimens only), is one component of a comprehensive MRSA colonization surveillance program. It is not intended to diagnose MRSA infection nor to guide or monitor treatment for MRSA infections. Performed at [facility], [place] [address] [place], [place] [address]     Studies/Results: ECHOCARDIOGRAM LIMITED  Result Date: 01/17/2020    ECHOCARDIOGRAM LIMITED REPORT   Patient Name:   ALIYANNA GENDLER Date of Exam: 01/17/2020 Medical Rec #:  [id number]        Height:       66.0 in Accession #:    [id number]       Weight:       386.2 lb Date of Birth:  12-Jun-1966         BSA:          2.645  m Patient Age:    87 years         BP:           148/130 mmHg Patient Gender: F                HR:           111 bpm. Exam Location:  Inpatient Procedure: Limited Echo and Cardiac Doppler Indications:    Pericardial effusion 423.9/I31.3  History:        Patient has no prior history of Echocardiogram examinations and                 Patient has prior history of Echocardiogram examinations, most                 recent 01/17/2020. Arrythmias:Atrial Fibrillation; Risk                 Factors:Diabetes and Sleep Apnea.  Sonographer:    Clayton Lefort RDCS (AE) Referring Phys: [id number] CADENCE H FURTH  Sonographer Comments: Technically difficult study due to poor echo windows and patient is morbidly obese. Image acquisition challenging due to patient body habitus. IMPRESSIONS  1. RV cavity appears small, no clear diastolic collapse. Right ventricular systolic function is normal. The right ventricular size is normal.  2. Promient apical fat pad is noted. moderate to large circumferential pericardial effusion (measuring between 1.8-2.4 cm).  3. No clear significant respiratory inflow variation.  4. Borderline tricuspid inflow variation with respiration. Comparison(s): Prior images reviewed side by side. 01/17/20: LVEF 35-40%, large pericardial effusion, dilated IVC. Conclusion(s)/Recommendation(s): Comparing both sets of images, there is a moderate to large (>2.0 cm) circumferential pericardial effusion, as well as a promient apical fat pad. The IVC is diltated and there is minimal tricuspid, but no significant mitral inflow variation with respiration. The RV appears normal diastolic collapse is not noted. Tamponade physiology is difficult to ascertain in the setting of cardiomyopathy, but not suspected. Vitals indicate significant hypertension, which does not suggest tamponade. Clinical correlation is always recommended. FINDINGS  Right Ventricle: RV cavity appears small, no clear diastolic collapse. The right  ventricular size is normal. No increase in right ventricular wall thickness. Right ventricular systolic function is normal. Pericardium: Promient apical fat pad is noted. Moderate to large circumferential pericardial effusion (measuring between 1.8-2.4 cm). Presence of pericardial fat pad. Mitral Valve: No clear significant respiratory inflow variation. Tricuspid Valve: Borderline tricuspid inflow variation with respiration. Lyman Bishop MD Electronically signed by Lyman Bishop MD Signature Date/Time: 01/17/2020/7:56:35 PM    Final    ECHOCARDIOGRAM LIMITED  Result Date: 01/17/2020    ECHOCARDIOGRAM LIMITED REPORT   Patient Name:   KIEYA SANDOR Date of Exam: 01/17/2020 Medical Rec #:  [id number]        Height:       66.0 in Accession #:    [id number]       Weight:       385.2 lb Date of Birth:  03-18-1966        BSA:          2.642 m Patient Age:    60 years         BP:           151/117 mmHg Patient Gender: F                HR:           125 bpm. Exam Location:  Inpatient Procedure: Limited Echo, Intracardiac Opacification Agent,  Cardiac Doppler and            Color Doppler Indications:    Atrial fibrillation  History:        Patient has no prior history of Echocardiogram examinations.                 Morbid obesity, Arrythmias:Atrial Fibrillation; Risk                 Factors:Sleep Apnea and Diabetes. Preop for kidney stones.  Sonographer:    Dustin Flock Referring Phys: [phone] RALPH A NETTEY  Sonographer Comments: Technically difficult study due to poor echo windows and patient is morbidly obese. Image acquisition challenging due to patient body habitus. IMPRESSIONS  1. Very limited echo with poor windows, Definity contrast given - there appears to be moderate systolic dysfunction with large pericardial effusion. No prior study for comparison.  2. Left ventricular ejection fraction, by estimation, is 35 to 40%. The left ventricle has moderately decreased function. The left ventricle demonstrates global  hypokinesis. There is moderate left ventricular hypertrophy. Left ventricular diastolic function could not be evaluated.  3. Right ventricular systolic function is mildly reduced. The right ventricular size is normal. Moderately increased right ventricular wall thickness.  4. Large circumferential pericardial effusion measuring about 2.5 cm. There may also be pericaridal fat. Images are limited.  5. Study was not sufficient to exclude possible tamponade physiology - difficult images due to body habitus. Consider repeat study, TEE or right heart cath to further evaluate.. Large pericardial effusion. The pericardial effusion is circumferential.  6. The inferior vena cava is dilated in size with <50% respiratory variability, suggesting right atrial pressure of 15 mmHg.  7. Left atrial size was moderately dilated. FINDINGS  Left Ventricle: Left ventricular ejection fraction, by estimation, is 35 to 40%. The left ventricle has moderately decreased function. The left ventricle demonstrates global hypokinesis. Definity contrast agent was given IV to delineate the left ventricular endocardial borders. There is moderate left ventricular hypertrophy. Left ventricular diastolic function could not be evaluated due to atrial fibrillation. Right Ventricle: The right ventricular size is normal. Moderately increased right ventricular wall thickness. Right ventricular systolic function is mildly reduced. Left Atrium: Left atrial size was moderately dilated. Pericardium: Study was not sufficient to exclude possible tamponade physiology - difficult images due to body habitus. Consider repeat study, TEE or right heart cath to further evaluate. A large pericardial effusion is present. The pericardial effusion is circumferential. The pericardial effusion appears to contain mixed echogenic material. Venous: The inferior vena cava is dilated in size with less than 50% respiratory variability, suggesting right atrial pressure of 15 mmHg.   LEFT VENTRICLE PLAX 2D LVIDd:         4.00 cm Diastology LVIDs:         3.00 cm LV e' lateral:   5.98 cm/s LV PW:         1.40 cm LV E/e' lateral: 16.5 LV IVS:        1.30 cm LV e' medial:    5.98 cm/s                        LV E/e' medial:  16.5  MITRAL VALVE MV Area (PHT): 12.04 cm MV Decel Time: 63 msec MV E velocity: 98.50 cm/s MV A velocity: 46.50 cm/s MV E/A ratio:  2.12 Lyman Bishop MD Electronically signed by Lyman Bishop MD Signature Date/Time: 01/17/2020/10:42:30 AM    Final  Assessment/Plan: 54 yo woman with left non-obstructing renal calculi who developed A fib prior to elective ureteroscopy and surgery cancelled.  She was admitted to cardiology with plans for cardioversion/TTE tomorrow.  1. Hematuria - pt is now anti-coagulated and will likely remain that way for about 4 weeks following cardioversion.  As long as pt is emptying bladder and Hgb stable no intervention for now.  Recommend plenty of fluid intake.    2. Renal calculus - prefer to defer this until after A fib is stable. Also, no reason to pursue while on anticoagulation unless stone becomes obstructing.  Renal CT obtained to further evaluate.  Follow up as outpatient with [facility] [phone] 2-3 weeks after d/c from hospital.    Jacalyn Lefevre, MD Urology 01/18/2020, 9:49 AM

## 2020-01-18 NOTE — Progress Notes (Signed)
[facility]                                                                              Patient Demographics  Holly [name], is a 54 y.o. female, DOB - May 23, 1966, [id number]  Admit date - 01/16/2020   Admitting Physician Mariel Aloe, MD  Outpatient Primary MD for the patient is Shirline Frees, MD  Outpatient specialists:   LOS - 1  days   Medical records reviewed and are as summarized below:    No chief complaint on file.      Brief summary    Holly [name] is a 54 y.o. female with medical history significant of anxiety, depression, headaches, GERD, Obesity, OSA. Patient presented for an elective cystoscopy with retrograde pyelogram with left ureteroscopy and stent placement. In PACU, she developed significant tachycardia with rates up to 160 bpm. EKG significant for atrial fibrillation with RVR. Cardiology consulted and started patient on a Cardizem drip.  Assessment & Plan    Principal problem Atrial fibrillation with RVR, new diagnosis -Patient was placed on Cardizem drip, admitted to stepdown unit.   -No significant improvement with the diltiazem, hence was discontinued on 4/6 and started on IV amiodarone with loading dose by cardiology. -Continue Eliquis for stroke risk reduction.  Heart rate still uncontrolled. -2D echo showed large pericardial effusion, circumferentially, EF 35 to 40%.  -Cardiology following, plan for TEE/DCCV on 4/9. -Patient complaining of hematuria, cardiology discussed with Dr. Claudia Desanctis and okay with continuing Eliquis due to plans for cardioversion tomorrow  Left nonobstructing renal calculi -Initially planned elective cystoscopy with retrograde pyelogram, stent placement however developed A. fib with RVR in PACU -No immediate urology care interventions planned  Moderate pericardial effusion - -2D echo showed large pericardial effusion, circumferentially.  - Per cardiology, Dr. Radford Pax, pericardial effusion is likely  moderate and posteriorly located with large fat pad anteriorly over the RV with no tamponade.  Patient had a URI a month ago, may have resulted in effusion.  - Added colchicine 0.6 mg twice daily, avoid NSAIDs given that patient is on anticoagulation   Diabetes mellitus type 2, IDDM -Continue Lantus, sliding scale insulin CBGs currently controlled hemoglobin A1c 7.5  OSA Continue CPAP nightly  Cough variant asthma Continue Flonase, Singulair  Anxiety Continue Celexa  Hyperlipidemia Continue Lipitor  GERD Continue PPI  Endometriosis -Continue norethindrone  Chronic venous changes Secondary to weight/edema. Associated scaly/dry skin -Eucerin BID -Wound consult for patient education for better management of legs  Chronic headaches -Continue Topamax   Morbid obesity Estimated body mass index is 62.34 kg/m as calculated from the following:   Height as of this encounter: 5\' 6"  (1.676 m).   Weight as of this encounter: 175.2 kg.  Code Status: DNR DVT Prophylaxis: Apixaban Family Communication: Discussed all imaging results, lab results, explained to the patient    Disposition Plan: Patient from home anticipate discharge home after cardioversion if heart rate is controlled and cleared by cards.   Time Spent in minutes 35 minutes Procedures:  2D echo Consultants:   Cardiology Urology Antimicrobials:   Anti-infectives (From admission, onward)   Start  Dose/Rate Route Frequency Ordered Stop   01/16/20 0911  cefTRIAXone (ROCEPHIN) 2 g in sodium chloride 0.9 % 100 mL IVPB  Status:  Discontinued     2 g 200 mL/hr over 30 Minutes Intravenous 30 min pre-op 01/16/20 0911 01/16/20 1710         Medications  Scheduled Meds: . apixaban  5 mg Oral BID  . atorvastatin  10 mg Oral QHS  . calcium carbonate  1 tablet Oral BID WC  . Chlorhexidine Gluconate Cloth  6 each Topical Daily  . cholecalciferol  1,000 Units Oral BID  . citalopram  40 mg Oral QHS  .  darifenacin  15 mg Oral Daily  . ferrous sulfate  325 mg Oral Q breakfast  . hydrocerin   Topical BID  . insulin aspart  0-15 Units Subcutaneous TID WC  . insulin aspart  0-5 Units Subcutaneous QHS  . insulin glargine  30 Units Subcutaneous QHS  . loratadine  10 mg Oral QHS  . mouth rinse  15 mL Mouth Rinse BID  . metoprolol tartrate  25 mg Oral Q6H  . montelukast  10 mg Oral QHS  . norethindrone  20 mg Oral Daily  . pantoprazole  40 mg Oral Daily  . topiramate  150 mg Oral QPM  . vitamin B-12  1,000 mcg Oral Daily   Continuous Infusions: . amiodarone 30 mg/hr (01/18/20 0600)  . lactated ringers 50 mL/hr at 01/18/20 0600   PRN Meds:.acetaminophen **OR** acetaminophen, fluticasone, HYDROcodone-acetaminophen      Subjective:   Holly [name] was seen and examined today.  Heart rate in low 100s at the time of my examination.  Still continues to have hematuria.  Had some dizziness overnight but no syncope.  Patient denies dizziness, chest pain, shortness of breath, new weakness, numbess, tingling.   Objective:   Vitals:   01/18/20 0900 01/18/20 1000 01/18/20 1130 01/18/20 1200  BP: (!) 163/100 (!) 153/91 (!) 152/95   Pulse: 100 (!) 31 (!) 49   Resp: (!) 21 (!) 24 (!) 23   Temp:    98.3 F (36.8 C)  TempSrc:    Oral  SpO2: 97% 96% 92%   Weight:      Height:        Intake/Output Summary (Last 24 hours) at 01/18/2020 1407 Last data filed at 01/18/2020 0600 Gross per 24 hour  Intake 1679.31 ml  Output 1050 ml  Net 629.31 ml     Wt Readings from Last 3 Encounters:  01/17/20 (!) 175.2 kg  01/11/20 (!) 174.7 kg  01/10/20 (!) 174.6 kg    Physical Exam  General: Alert and oriented x 3, NAD  Cardiovascular: Irregularly irregular, tachycardia  Respiratory: Diminished breath sound at the bases  Gastrointestinal: Soft, nontender, nondistended, NBS  Ext: no pedal edema bilaterally  Neuro: no new deficits  Musculoskeletal: No cyanosis, clubbing  Skin: No  rashes  Psych: Normal affect and demeanor, alert and oriented x3   Data Reviewed:  I have personally reviewed following labs and imaging studies  Micro Results Recent Results (from the past 240 hour(s))  Urine culture     Status: Abnormal   Collection Time: 01/11/20  8:15 AM   Specimen: Urine, Clean Catch  Result Value Ref Range Status   Specimen Description   Final    URINE, CLEAN CATCH Performed at [facility], [place] [address], [place], [place] [address]    Special Requests   Final    NONE Performed at  [facility], [place] [address] [place], [place] [address]    Culture 10,000 COLONIES/mL ENTEROBACTER CLOACAE (A)  Final   Report Status 01/15/2020 FINAL  Final   Organism ID, Bacteria ENTEROBACTER CLOACAE (A)  Final      Susceptibility   Enterobacter cloacae - MIC*    CEFAZOLIN >=64 RESISTANT Resistant     CIPROFLOXACIN <=0.25 SENSITIVE Sensitive     GENTAMICIN <=1 SENSITIVE Sensitive     IMIPENEM <=0.25 SENSITIVE Sensitive     NITROFURANTOIN 64 INTERMEDIATE Intermediate     TRIMETH/SULFA 160 RESISTANT Resistant     PIP/TAZO 32 INTERMEDIATE Intermediate     * 10,000 COLONIES/mL ENTEROBACTER CLOACAE  SARS CORONAVIRUS 2 (TAT 6-24 HRS) Nasopharyngeal Nasopharyngeal Swab     Status: None   Collection Time: 01/12/20  2:16 PM   Specimen: Nasopharyngeal Swab  Result Value Ref Range Status   SARS Coronavirus 2 NEGATIVE NEGATIVE Final    Comment: (NOTE) SARS-CoV-2 target nucleic acids are NOT DETECTED. The SARS-CoV-2 RNA is generally detectable in upper and lower respiratory specimens during the acute phase of infection. Negative results do not preclude SARS-CoV-2 infection, do not rule out co-infections with other pathogens, and should not be used as the sole basis for treatment or other patient management decisions. Negative results must be combined with clinical observations, patient history, and epidemiological information. The  expected result is Negative. Fact Sheet for Patients: SugarRoll.be Fact Sheet for Healthcare Providers: https://www.woods-mathews.com/ This test is not yet approved or cleared by the [place] FDA and  has been authorized for detection and/or diagnosis of SARS-CoV-2 by FDA under an Emergency Use Authorization (EUA). This EUA will remain  in effect (meaning this test can be used) for the duration of the COVID-19 declaration under Section 56 4(b)(1) of the Act, 21 U.S.C. section 360bbb-3(b)(1), unless the authorization is terminated or revoked sooner. Performed at [facility], [place] [address] [place], [place] [address]   MRSA PCR Screening     Status: None   Collection Time: 01/16/20  6:12 PM   Specimen: Nasopharyngeal  Result Value Ref Range Status   MRSA by PCR NEGATIVE NEGATIVE Final    Comment:        The GeneXpert MRSA Assay (FDA approved for NASAL specimens only), is one component of a comprehensive MRSA colonization surveillance program. It is not intended to diagnose MRSA infection nor to guide or monitor treatment for MRSA infections. Performed at [facility], [place] [address], [place], [place] [address]     Radiology Reports ECHOCARDIOGRAM LIMITED  Result Date: 01/17/2020    ECHOCARDIOGRAM LIMITED REPORT   Patient Name:   Holly [name] Date of Exam: 01/17/2020 Medical Rec #:  [id number]        Height:       66.0 in Accession #:    [id number]       Weight:       386.2 lb Date of Birth:  May 20, 1966        BSA:          2.645 m Patient Age:    65 years         BP:           148/130 mmHg Patient Gender: F                HR:           111 bpm. Exam Location:  Inpatient Procedure: Limited Echo and Cardiac Doppler Indications:  Pericardial effusion 423.9/I31.3  History:        Patient has no prior history of Echocardiogram examinations and                 Patient has prior history of Echocardiogram  examinations, most                 recent 01/17/2020. Arrythmias:Atrial Fibrillation; Risk                 Factors:Diabetes and Sleep Apnea.  Sonographer:    Clayton Lefort RDCS (AE) Referring Phys: [id number] CADENCE H FURTH  Sonographer Comments: Technically difficult study due to poor echo windows and patient is morbidly obese. Image acquisition challenging due to patient body habitus. IMPRESSIONS  1. RV cavity appears small, no clear diastolic collapse. Right ventricular systolic function is normal. The right ventricular size is normal.  2. Promient apical fat pad is noted. moderate to large circumferential pericardial effusion (measuring between 1.8-2.4 cm).  3. No clear significant respiratory inflow variation.  4. Borderline tricuspid inflow variation with respiration. Comparison(s): Prior images reviewed side by side. 01/17/20: LVEF 35-40%, large pericardial effusion, dilated IVC. Conclusion(s)/Recommendation(s): Comparing both sets of images, there is a moderate to large (>2.0 cm) circumferential pericardial effusion, as well as a promient apical fat pad. The IVC is diltated and there is minimal tricuspid, but no significant mitral inflow variation with respiration. The RV appears normal diastolic collapse is not noted. Tamponade physiology is difficult to ascertain in the setting of cardiomyopathy, but not suspected. Vitals indicate significant hypertension, which does not suggest tamponade. Clinical correlation is always recommended. FINDINGS  Right Ventricle: RV cavity appears small, no clear diastolic collapse. The right ventricular size is normal. No increase in right ventricular wall thickness. Right ventricular systolic function is normal. Pericardium: Promient apical fat pad is noted. Moderate to large circumferential pericardial effusion (measuring between 1.8-2.4 cm). Presence of pericardial fat pad. Mitral Valve: No clear significant respiratory inflow variation. Tricuspid Valve: Borderline tricuspid inflow  variation with respiration. Lyman Bishop MD Electronically signed by Lyman Bishop MD Signature Date/Time: 01/17/2020/7:56:35 PM    Final    ECHOCARDIOGRAM LIMITED  Result Date: 01/17/2020    ECHOCARDIOGRAM LIMITED REPORT   Patient Name:   Holly [name] Date of Exam: 01/17/2020 Medical Rec #:  [id number]        Height:       66.0 in Accession #:    [id number]       Weight:       385.2 lb Date of Birth:  Mar 12, 1966        BSA:          2.642 m Patient Age:    34 years         BP:           151/117 mmHg Patient Gender: F                HR:           125 bpm. Exam Location:  Inpatient Procedure: Limited Echo, Intracardiac Opacification Agent, Cardiac Doppler and            Color Doppler Indications:    Atrial fibrillation  History:        Patient has no prior history of Echocardiogram examinations.                 Morbid obesity, Arrythmias:Atrial Fibrillation; Risk  Factors:Sleep Apnea and Diabetes. Preop for kidney stones.  Sonographer:    Dustin Flock Referring Phys: [phone] RALPH A NETTEY  Sonographer Comments: Technically difficult study due to poor echo windows and patient is morbidly obese. Image acquisition challenging due to patient body habitus. IMPRESSIONS  1. Very limited echo with poor windows, Definity contrast given - there appears to be moderate systolic dysfunction with large pericardial effusion. No prior study for comparison.  2. Left ventricular ejection fraction, by estimation, is 35 to 40%. The left ventricle has moderately decreased function. The left ventricle demonstrates global hypokinesis. There is moderate left ventricular hypertrophy. Left ventricular diastolic function could not be evaluated.  3. Right ventricular systolic function is mildly reduced. The right ventricular size is normal. Moderately increased right ventricular wall thickness.  4. Large circumferential pericardial effusion measuring about 2.5 cm. There may also be pericaridal fat. Images are limited.  5.  Study was not sufficient to exclude possible tamponade physiology - difficult images due to body habitus. Consider repeat study, TEE or right heart cath to further evaluate.. Large pericardial effusion. The pericardial effusion is circumferential.  6. The inferior vena cava is dilated in size with <50% respiratory variability, suggesting right atrial pressure of 15 mmHg.  7. Left atrial size was moderately dilated. FINDINGS  Left Ventricle: Left ventricular ejection fraction, by estimation, is 35 to 40%. The left ventricle has moderately decreased function. The left ventricle demonstrates global hypokinesis. Definity contrast agent was given IV to delineate the left ventricular endocardial borders. There is moderate left ventricular hypertrophy. Left ventricular diastolic function could not be evaluated due to atrial fibrillation. Right Ventricle: The right ventricular size is normal. Moderately increased right ventricular wall thickness. Right ventricular systolic function is mildly reduced. Left Atrium: Left atrial size was moderately dilated. Pericardium: Study was not sufficient to exclude possible tamponade physiology - difficult images due to body habitus. Consider repeat study, TEE or right heart cath to further evaluate. A large pericardial effusion is present. The pericardial effusion is circumferential. The pericardial effusion appears to contain mixed echogenic material. Venous: The inferior vena cava is dilated in size with less than 50% respiratory variability, suggesting right atrial pressure of 15 mmHg.  LEFT VENTRICLE PLAX 2D LVIDd:         4.00 cm Diastology LVIDs:         3.00 cm LV e' lateral:   5.98 cm/s LV PW:         1.40 cm LV E/e' lateral: 16.5 LV IVS:        1.30 cm LV e' medial:    5.98 cm/s                        LV E/e' medial:  16.5  MITRAL VALVE MV Area (PHT): 12.04 cm MV Decel Time: 63 msec MV E velocity: 98.50 cm/s MV A velocity: 46.50 cm/s MV E/A ratio:  2.12 Lyman Bishop MD  Electronically signed by Lyman Bishop MD Signature Date/Time: 01/17/2020/10:42:30 AM    Final     Lab Data:  CBC: Recent Labs  Lab 01/16/20 1448 01/17/20 0234 01/18/20 0921  WBC 10.8* 10.2 10.2  HGB 14.1 13.6 15.2*  HCT 46.0 43.7 48.1*  MCV 84.7 85.2 83.2  PLT 268 243 [id number]   Basic Metabolic Panel: Recent Labs  Lab 01/16/20 1448 01/17/20 0234 01/18/20 0921  NA 141 141 138  K 4.0 4.0 4.7  CL 109 108 111  CO2 22 23 21*  GLUCOSE 125* 174* 139*  BUN 16 16 14   CREATININE 0.94 1.02* 1.15*  CALCIUM 9.4 9.3 9.2  MG 1.6*  --  1.7   GFR: Estimated Creatinine Clearance: 96.6 mL/min (A) (by C-G formula based on SCr of 1.15 mg/dL (H)). Liver Function Tests: No results for input(s): AST, ALT, ALKPHOS, BILITOT, PROT, ALBUMIN in the last 168 hours. No results for input(s): LIPASE, AMYLASE in the last 168 hours. No results for input(s): AMMONIA in the last 168 hours. Coagulation Profile: No results for input(s): INR, PROTIME in the last 168 hours. Cardiac Enzymes: No results for input(s): CKTOTAL, CKMB, CKMBINDEX, TROPONINI in the last 168 hours. BNP (last 3 results) No results for input(s): PROBNP in the last 8760 hours. HbA1C: No results for input(s): HGBA1C in the last 72 hours. CBG: Recent Labs  Lab 01/17/20 1107 01/17/20 1639 01/17/20 2114 01/18/20 0738 01/18/20 1213  GLUCAP 156* 242* 169* 141* 139*   Lipid Profile: Recent Labs    01/18/20 0250  CHOL 107  HDL 20*  LDLCALC 65  TRIG 112  CHOLHDL 5.4   Thyroid Function Tests: Recent Labs    01/16/20 1448  TSH 2.116   Anemia Panel: No results for input(s): VITAMINB12, FOLATE, FERRITIN, TIBC, IRON, RETICCTPCT in the last 72 hours. Urine analysis:    Component Value Date/Time   COLORURINE RED (A) 01/17/2020 1630   APPEARANCEUR CLOUDY (A) 01/17/2020 1630   LABSPEC 1.023 01/17/2020 1630   PHURINE 6.0 01/17/2020 1630   GLUCOSEU NEGATIVE 01/17/2020 1630   HGBUR LARGE (A) 01/17/2020 1630   BILIRUBINUR  NEGATIVE 01/17/2020 1630   KETONESUR NEGATIVE 01/17/2020 1630   PROTEINUR 100 (A) 01/17/2020 1630   UROBILINOGEN 0.2 08/31/2007 1343   NITRITE NEGATIVE 01/17/2020 1630   LEUKOCYTESUR NEGATIVE 01/17/2020 1630     Naveen Lorusso M.D. [facility] 01/18/2020, 2:07 PM   Call night coverage person covering after 7pm

## 2020-01-19 ENCOUNTER — Inpatient Hospital Stay (HOSPITAL_COMMUNITY): Payer: Medicare Other | Admitting: Certified Registered Nurse Anesthetist

## 2020-01-19 ENCOUNTER — Encounter (HOSPITAL_COMMUNITY)
Admission: RE | Disposition: A | Discharge status: Home / Self Care | Payer: Self-pay | Source: Home / Self Care | Attending: Internal Medicine

## 2020-01-19 ENCOUNTER — Inpatient Hospital Stay (HOSPITAL_COMMUNITY): Payer: Medicare Other

## 2020-01-19 ENCOUNTER — Encounter (HOSPITAL_COMMUNITY): Payer: Self-pay | Admitting: Family Medicine

## 2020-01-19 DIAGNOSIS — I4891 Unspecified atrial fibrillation: Secondary | ICD-10-CM

## 2020-01-19 DIAGNOSIS — I340 Nonrheumatic mitral (valve) insufficiency: Secondary | ICD-10-CM

## 2020-01-19 HISTORY — PX: TEE WITHOUT CARDIOVERSION: SHX5443

## 2020-01-19 HISTORY — PX: CARDIOVERSION: SHX1299

## 2020-01-19 LAB — BASIC METABOLIC PANEL
Anion gap: 11 (ref 5–15)
BUN: 17 mg/dL (ref 6–20)
CO2: 23 mmol/L (ref 22–32)
Calcium: 9.1 mg/dL (ref 8.9–10.3)
Chloride: 106 mmol/L (ref 98–111)
Creatinine, Ser: 1.07 mg/dL — ABNORMAL HIGH (ref 0.44–1.00)
GFR calc Af Amer: >60 mL/min (ref >60)
GFR calc non Af Amer: >60 mL/min (ref >60)
Glucose, Bld: 175 mg/dL — ABNORMAL HIGH (ref 70–99)
Potassium: 4.4 mmol/L (ref 3.5–5.1)
Sodium: 140 mmol/L (ref 135–145)

## 2020-01-19 LAB — GLUCOSE, CAPILLARY
Glucose-Capillary: 139 mg/dL — ABNORMAL HIGH (ref 70–99)
Glucose-Capillary: 120 mg/dL — ABNORMAL HIGH (ref 70–99)
Glucose-Capillary: 155 mg/dL — ABNORMAL HIGH (ref 70–99)
Glucose-Capillary: 176 mg/dL — ABNORMAL HIGH (ref 70–99)

## 2020-01-19 LAB — CBC
HCT: 45.2 % (ref 36.0–46.0)
Hemoglobin: 14 g/dL (ref 12.0–15.0)
MCH: 26.5 pg (ref 26.0–34.0)
MCHC: 31 g/dL (ref 30.0–36.0)
MCV: 85.4 fL (ref 80.0–100.0)
Platelets: 222 10*3/uL (ref 150–400)
RBC: 5.29 MIL/uL — ABNORMAL HIGH (ref 3.87–5.11)
RDW: 16.3 % — ABNORMAL HIGH (ref 11.5–15.5)
WBC: 9.6 10*3/uL (ref 4.0–10.5)
nRBC: 0 % (ref 0.0–0.2)

## 2020-01-19 SURGERY — ECHOCARDIOGRAM, TRANSESOPHAGEAL
Anesthesia: General

## 2020-01-19 MED ORDER — PROPOFOL 500 MG/50ML IV EMUL
INTRAVENOUS | Status: DC | PRN
  Administered 2020-01-19 (×2): 100 ug/kg/min via INTRAVENOUS

## 2020-01-19 MED ORDER — PROPOFOL 10 MG/ML IV BOLUS
INTRAVENOUS | Status: DC | PRN
  Administered 2020-01-19 (×3): 10 mg via INTRAVENOUS

## 2020-01-19 MED ORDER — ATROPINE SULFATE 1 MG/10ML IJ SOSY
PREFILLED_SYRINGE | INTRAMUSCULAR | Status: AC
  Filled 2020-01-19: qty 10

## 2020-01-19 MED ORDER — LACTATED RINGERS IV SOLN: INTRAVENOUS | Status: DC | PRN

## 2020-01-19 MED ORDER — ONDANSETRON HCL 4 MG/2ML IJ SOLN
4.0000 mg | Freq: Four times a day (QID) | INTRAMUSCULAR | Status: DC | PRN
  Administered 2020-01-19: 14:00:00 4 mg via INTRAVENOUS
  Filled 2020-01-19: qty 2

## 2020-01-19 MED ORDER — AMIODARONE HCL 200 MG PO TABS
200.0000 mg | ORAL_TABLET | Freq: Two times a day (BID) | ORAL | Status: DC
  Administered 2020-01-19 – 2020-01-20 (×3): 200 mg via ORAL
  Filled 2020-01-19 (×3): qty 1

## 2020-01-19 NOTE — Progress Notes (Signed)
[facility]                                                                              Patient Demographics  Holly [name], is a 54 y.o. female, DOB - 08/07/1966, [id number]  Admit date - 01/16/2020   Admitting Physician Holly Aloe, MD  Outpatient Primary MD for the patient is Holly Frees, MD  Outpatient specialists:   LOS - 2  days   Medical records reviewed and are as summarized below:    No chief complaint on file.      Brief summary    Holly [name] is a 54 y.o. female with medical history significant of anxiety, depression, headaches, GERD, Obesity, OSA. Patient presented for an elective cystoscopy with retrograde pyelogram with left ureteroscopy and stent placement. In PACU, she developed significant tachycardia with rates up to 160 bpm. EKG significant for atrial fibrillation with RVR. Cardiology consulted and started patient on a Cardizem drip.  Assessment & Plan    Principal problem Atrial fibrillation with RVR, new diagnosis -Patient was placed on Cardizem drip, admitted to stepdown unit.   -No significant improvement with the diltiazem, hence was discontinued on 4/6 and started on IV amiodarone with loading dose by cardiology. -Continue Eliquis for stroke risk reduction.  Heart rate still uncontrolled. -2D echo showed large pericardial effusion, circumferentially, EF 35 to 40%.  -Underwent TEE and cardioversion today.  Small to moderate effusion around heart, EF moderately depressed.    Left nonobstructing renal calculi -Initially planned elective cystoscopy with retrograde pyelogram, stent placement however developed A. fib with RVR in PACU -Plan for urological interventions outpatient after Eliquis is completed  Moderate pericardial effusion - 2D echo showed large pericardial effusion, circumferentially.  - Per cardiology, Holly [name], pericardial effusion is likely moderate and posteriorly located with large fat pad anteriorly  over the RV with no tamponade.  Patient had a URI a month ago, may have resulted in effusion.  -  avoid NSAIDs given that patient is on anticoagulation, recommendations per cardiology   Diabetes mellitus type 2, IDDM -Continue Lantus, sliding scale insulin CBGs currently controlled hemoglobin A1c 7.5  OSA CPAP nightly  Cough variant asthma Continue Flonase, Singulair  Anxiety Continue Celexa  Hyperlipidemia Continue Lipitor  GERD Continue PPI  Endometriosis -Continue norethindrone  Chronic venous changes Secondary to weight/edema. Associated scaly/dry skin -Eucerin BID -Wound consult for patient education for better management of legs  Chronic headaches -Continue Topamax   Morbid obesity Estimated body mass index is 62.34 kg/m as calculated from the following:   Height as of this encounter: 5\' 6"  (1.676 m).   Weight as of this encounter: 175.2 kg.  Code Status: DNR DVT Prophylaxis: Apixaban Family Communication: Discussed all imaging results, lab results, explained to the patient    Disposition Plan:  will transfer to the floor if no acute issues, hopefully DC home in a.m. if cleared by cardiology  Time Spent in minutes 35 minutes Procedures:  2D echo  Consultants:   Cardiology Urology Antimicrobials:   Anti-infectives (From admission, onward)   Start     Dose/Rate Route Frequency Ordered Stop   01/16/20  0911  cefTRIAXone (ROCEPHIN) 2 g in sodium chloride 0.9 % 100 mL IVPB  Status:  Discontinued     2 g 200 mL/hr over 30 Minutes Intravenous 30 min pre-op 01/16/20 0911 01/16/20 1710         Medications  Scheduled Meds: . apixaban  5 mg Oral BID  . atorvastatin  10 mg Oral QHS  . calcium carbonate  1 tablet Oral BID WC  . Chlorhexidine Gluconate Cloth  6 each Topical Daily  . cholecalciferol  1,000 Units Oral BID  . citalopram  40 mg Oral QHS  . darifenacin  15 mg Oral Daily  . ferrous sulfate  325 mg Oral Q breakfast  . hydrocerin    Topical BID  . insulin aspart  0-15 Units Subcutaneous TID WC  . insulin aspart  0-5 Units Subcutaneous QHS  . insulin glargine  30 Units Subcutaneous QHS  . loratadine  10 mg Oral QHS  . mouth rinse  15 mL Mouth Rinse BID  . metoprolol tartrate  25 mg Oral Q6H  . montelukast  10 mg Oral QHS  . norethindrone  20 mg Oral Daily  . pantoprazole  40 mg Oral Daily  . topiramate  150 mg Oral QPM  . vitamin B-12  1,000 mcg Oral Daily   Continuous Infusions: . amiodarone 30 mg/hr (01/19/20 1045)  . lactated ringers 50 mL/hr at 01/19/20 1045   PRN Meds:.acetaminophen **OR** acetaminophen, camphor-menthol, diphenhydrAMINE, fluticasone, HYDROcodone-acetaminophen      Subjective:   Holly [name] was seen and examined today.  Seen this a.m., heart rate in low 100s, atrial fibrillation, waiting for cardioversion.  No dizziness.  Still hematuria.   Patient denies dizziness, chest pain, shortness of breath, new weakness, numbess, tingling.   Objective:   Vitals:   01/19/20 1029 01/19/20 1041 01/19/20 1145 01/19/20 1245  BP: 126/79 122/72 (!) 149/98   Pulse: 62 65 71   Resp: 18 (!) 21 20   Temp:    98 F (36.7 C)  TempSrc:    Oral  SpO2: 96% 95% 96%   Weight:      Height:        Intake/Output Summary (Last 24 hours) at 01/19/2020 1254 Last data filed at 01/19/2020 [id number] Gross per 24 hour  Intake --  Output 1330 ml  Net -1330 ml     Wt Readings from Last 3 Encounters:  01/17/20 (!) 175.2 kg  01/11/20 (!) 174.7 kg  01/10/20 (!) 174.6 kg   Physical Exam  General: Alert and oriented x 3, NAD  Cardiovascular: Irregularly irregular, no pedal edema  Respiratory: CTAB, no wheezing, rales or rhonchi  Gastrointestinal: Soft, nontender, nondistended, NBS  Ext: no pedal edema bilaterally  Neuro: no new deficits  Musculoskeletal: No cyanosis, clubbing  Skin: No rashes  Psych: Normal affect and demeanor, alert and oriented x3     Data Reviewed:  I have personally reviewed  following labs and imaging studies  Micro Results Recent Results (from the past 240 hour(s))  Urine culture     Status: Abnormal   Collection Time: 01/11/20  8:15 AM   Specimen: Urine, Clean Catch  Result Value Ref Range Status   Specimen Description   Final    URINE, CLEAN CATCH Performed at [facility], [place] [address] [place], [place] [address]    Special Requests   Final    NONE Performed at [facility], [place] [address], [place], [place] [address]    Culture  10,000 COLONIES/mL ENTEROBACTER CLOACAE (A)  Final   Report Status 01/15/2020 FINAL  Final   Organism ID, Bacteria ENTEROBACTER CLOACAE (A)  Final      Susceptibility   Enterobacter cloacae - MIC*    CEFAZOLIN >=64 RESISTANT Resistant     CIPROFLOXACIN <=0.25 SENSITIVE Sensitive     GENTAMICIN <=1 SENSITIVE Sensitive     IMIPENEM <=0.25 SENSITIVE Sensitive     NITROFURANTOIN 64 INTERMEDIATE Intermediate     TRIMETH/SULFA 160 RESISTANT Resistant     PIP/TAZO 32 INTERMEDIATE Intermediate     * 10,000 COLONIES/mL ENTEROBACTER CLOACAE  SARS CORONAVIRUS 2 (TAT 6-24 HRS) Nasopharyngeal Nasopharyngeal Swab     Status: None   Collection Time: 01/12/20  2:16 PM   Specimen: Nasopharyngeal Swab  Result Value Ref Range Status   SARS Coronavirus 2 NEGATIVE NEGATIVE Final    Comment: (NOTE) SARS-CoV-2 target nucleic acids are NOT DETECTED. The SARS-CoV-2 RNA is generally detectable in upper and lower respiratory specimens during the acute phase of infection. Negative results do not preclude SARS-CoV-2 infection, do not rule out co-infections with other pathogens, and should not be used as the sole basis for treatment or other patient management decisions. Negative results must be combined with clinical observations, patient history, and epidemiological information. The expected result is Negative. Fact Sheet for Patients: SugarRoll.be Fact Sheet for Healthcare  Providers: https://www.woods-mathews.com/ This test is not yet approved or cleared by the [place] FDA and  has been authorized for detection and/or diagnosis of SARS-CoV-2 by FDA under an Emergency Use Authorization (EUA). This EUA will remain  in effect (meaning this test can be used) for the duration of the COVID-19 declaration under Section 56 4(b)(1) of the Act, 21 U.S.C. section 360bbb-3(b)(1), unless the authorization is terminated or revoked sooner. Performed at [facility], [place] [address] [place], [place] [address]   MRSA PCR Screening     Status: None   Collection Time: 01/16/20  6:12 PM   Specimen: Nasopharyngeal  Result Value Ref Range Status   MRSA by PCR NEGATIVE NEGATIVE Final    Comment:        The GeneXpert MRSA Assay (FDA approved for NASAL specimens only), is one component of a comprehensive MRSA colonization surveillance program. It is not intended to diagnose MRSA infection nor to guide or monitor treatment for MRSA infections. Performed at [facility], [place] [address], [place], [place] [address]     Radiology Reports CT ABDOMEN PELVIS WO CONTRAST  Result Date: 01/18/2020 CLINICAL DATA:  Left flank pain. Gross hematuria. EXAM: CT ABDOMEN AND PELVIS WITHOUT CONTRAST TECHNIQUE: Multidetector CT imaging of the abdomen and pelvis was performed following the standard protocol without IV contrast. COMPARISON:  None. FINDINGS: Lower chest: Small pericardial effusion. Visualized lung bases are unremarkable. Hepatobiliary: Cholelithiasis noted. No biliary dilatation is noted. No abnormality seen in the liver on these unenhanced images. Pancreas: Unremarkable. No pancreatic ductal dilatation or surrounding inflammatory changes. Spleen: Normal in size without focal abnormality. Adrenals/Urinary Tract: Adrenal glands appear normal. Bilateral nephrolithiasis is noted. Largest calculus measures 12 mm in the left renal pelvis. No  hydronephrosis is noted. There appears to be a 9 mm calculus at the right ureterovesical junction which does not appear to result in significant ureteral dilatation. Urinary bladder is otherwise unremarkable. Stomach/Bowel: The stomach appears normal. The appendix is unremarkable. There is no evidence of bowel obstruction or inflammation. Stool is noted throughout the colon. Vascular/Lymphatic: No significant vascular findings are present. No enlarged  abdominal or pelvic lymph nodes. Reproductive: Uterus and bilateral adnexa are unremarkable. Other: Large fat containing periumbilical hernia is noted. No ascites is noted. Musculoskeletal: No acute or significant osseous findings. IMPRESSION: 1. Bilateral nephrolithiasis. Largest calculus measures 12 mm in the left renal pelvis. No hydronephrosis is noted. There appears to be a 9 mm calculus at the right ureterovesical junction which does not appear to result in significant ureteral dilatation. 2. Small pericardial effusion. 3. Cholelithiasis. 4. Large fat containing periumbilical hernia. Electronically Signed   By: Marijo Conception M.D.   On: 01/18/2020 16:23   ECHOCARDIOGRAM LIMITED  Result Date: 01/17/2020    ECHOCARDIOGRAM LIMITED REPORT   Patient Name:   Holly [name] Date of Exam: 01/17/2020 Medical Rec #:  [id number]        Height:       66.0 in Accession #:    [id number]       Weight:       386.2 lb Date of Birth:  01/08/1966        BSA:          2.645 m Patient Age:    28 years         BP:           148/130 mmHg Patient Gender: F                HR:           111 bpm. Exam Location:  Inpatient Procedure: Limited Echo and Cardiac Doppler Indications:    Pericardial effusion 423.9/I31.3  History:        Patient has no prior history of Echocardiogram examinations and                 Patient has prior history of Echocardiogram examinations, most                 recent 01/17/2020. Arrythmias:Atrial Fibrillation; Risk                 Factors:Diabetes and Sleep  Apnea.  Sonographer:    Clayton Lefort RDCS (AE) Referring Phys: [id number] CADENCE H FURTH  Sonographer Comments: Technically difficult study due to poor echo windows and patient is morbidly obese. Image acquisition challenging due to patient body habitus. IMPRESSIONS  1. RV cavity appears small, no clear diastolic collapse. Right ventricular systolic function is normal. The right ventricular size is normal.  2. Promient apical fat pad is noted. moderate to large circumferential pericardial effusion (measuring between 1.8-2.4 cm).  3. No clear significant respiratory inflow variation.  4. Borderline tricuspid inflow variation with respiration. Comparison(s): Prior images reviewed side by side. 01/17/20: LVEF 35-40%, large pericardial effusion, dilated IVC. Conclusion(s)/Recommendation(s): Comparing both sets of images, there is a moderate to large (>2.0 cm) circumferential pericardial effusion, as well as a promient apical fat pad. The IVC is diltated and there is minimal tricuspid, but no significant mitral inflow variation with respiration. The RV appears normal diastolic collapse is not noted. Tamponade physiology is difficult to ascertain in the setting of cardiomyopathy, but not suspected. Vitals indicate significant hypertension, which does not suggest tamponade. Clinical correlation is always recommended. FINDINGS  Right Ventricle: RV cavity appears small, no clear diastolic collapse. The right ventricular size is normal. No increase in right ventricular wall thickness. Right ventricular systolic function is normal. Pericardium: Promient apical fat pad is noted. Moderate to large circumferential pericardial effusion (measuring between 1.8-2.4 cm). Presence of pericardial fat pad. Mitral Valve: No clear significant  respiratory inflow variation. Tricuspid Valve: Borderline tricuspid inflow variation with respiration. Lyman Bishop MD Electronically signed by Lyman Bishop MD Signature Date/Time: 01/17/2020/7:56:35 PM     Final    ECHOCARDIOGRAM LIMITED  Result Date: 01/17/2020    ECHOCARDIOGRAM LIMITED REPORT   Patient Name:   Holly [name] Date of Exam: 01/17/2020 Medical Rec #:  [id number]        Height:       66.0 in Accession #:    [id number]       Weight:       385.2 lb Date of Birth:  18-Sep-1966        BSA:          2.642 m Patient Age:    15 years         BP:           151/117 mmHg Patient Gender: F                HR:           125 bpm. Exam Location:  Inpatient Procedure: Limited Echo, Intracardiac Opacification Agent, Cardiac Doppler and            Color Doppler Indications:    Atrial fibrillation  History:        Patient has no prior history of Echocardiogram examinations.                 Morbid obesity, Arrythmias:Atrial Fibrillation; Risk                 Factors:Sleep Apnea and Diabetes. Preop for kidney stones.  Sonographer:    Dustin Flock Referring Phys: [phone] RALPH A NETTEY  Sonographer Comments: Technically difficult study due to poor echo windows and patient is morbidly obese. Image acquisition challenging due to patient body habitus. IMPRESSIONS  1. Very limited echo with poor windows, Definity contrast given - there appears to be moderate systolic dysfunction with large pericardial effusion. No prior study for comparison.  2. Left ventricular ejection fraction, by estimation, is 35 to 40%. The left ventricle has moderately decreased function. The left ventricle demonstrates global hypokinesis. There is moderate left ventricular hypertrophy. Left ventricular diastolic function could not be evaluated.  3. Right ventricular systolic function is mildly reduced. The right ventricular size is normal. Moderately increased right ventricular wall thickness.  4. Large circumferential pericardial effusion measuring about 2.5 cm. There may also be pericaridal fat. Images are limited.  5. Study was not sufficient to exclude possible tamponade physiology - difficult images due to body habitus. Consider repeat study, TEE  or right heart cath to further evaluate.. Large pericardial effusion. The pericardial effusion is circumferential.  6. The inferior vena cava is dilated in size with <50% respiratory variability, suggesting right atrial pressure of 15 mmHg.  7. Left atrial size was moderately dilated. FINDINGS  Left Ventricle: Left ventricular ejection fraction, by estimation, is 35 to 40%. The left ventricle has moderately decreased function. The left ventricle demonstrates global hypokinesis. Definity contrast agent was given IV to delineate the left ventricular endocardial borders. There is moderate left ventricular hypertrophy. Left ventricular diastolic function could not be evaluated due to atrial fibrillation. Right Ventricle: The right ventricular size is normal. Moderately increased right ventricular wall thickness. Right ventricular systolic function is mildly reduced. Left Atrium: Left atrial size was moderately dilated. Pericardium: Study was not sufficient to exclude possible tamponade physiology - difficult images due to body habitus. Consider repeat study, TEE or right heart cath  to further evaluate. A large pericardial effusion is present. The pericardial effusion is circumferential. The pericardial effusion appears to contain mixed echogenic material. Venous: The inferior vena cava is dilated in size with less than 50% respiratory variability, suggesting right atrial pressure of 15 mmHg.  LEFT VENTRICLE PLAX 2D LVIDd:         4.00 cm Diastology LVIDs:         3.00 cm LV e' lateral:   5.98 cm/s LV PW:         1.40 cm LV E/e' lateral: 16.5 LV IVS:        1.30 cm LV e' medial:    5.98 cm/s                        LV E/e' medial:  16.5  MITRAL VALVE MV Area (PHT): 12.04 cm MV Decel Time: 63 msec MV E velocity: 98.50 cm/s MV A velocity: 46.50 cm/s MV E/A ratio:  2.12 Lyman Bishop MD Electronically signed by Lyman Bishop MD Signature Date/Time: 01/17/2020/10:42:30 AM    Final     Lab Data:  CBC: Recent Labs  Lab  01/16/20 1448 01/17/20 0234 01/18/20 0921 01/19/20 0308  WBC 10.8* 10.2 10.2 9.6  HGB 14.1 13.6 15.2* 14.0  HCT 46.0 43.7 48.1* 45.2  MCV 84.7 85.2 83.2 85.4  PLT 268 243 248 [id number]   Basic Metabolic Panel: Recent Labs  Lab 01/16/20 1448 01/17/20 0234 01/18/20 0921 01/19/20 0308  NA 141 141 138 140  K 4.0 4.0 4.7 4.4  CL 109 108 111 106  CO2 22 23 21* 23  GLUCOSE 125* 174* 139* 175*  BUN 16 16 14 17   CREATININE 0.94 1.02* 1.15* 1.07*  CALCIUM 9.4 9.3 9.2 9.1  MG 1.6*  --  1.7  --    GFR: Estimated Creatinine Clearance: 103.8 mL/min (A) (by C-G formula based on SCr of 1.07 mg/dL (H)). Liver Function Tests: No results for input(s): AST, ALT, ALKPHOS, BILITOT, PROT, ALBUMIN in the last 168 hours. No results for input(s): LIPASE, AMYLASE in the last 168 hours. No results for input(s): AMMONIA in the last 168 hours. Coagulation Profile: No results for input(s): INR, PROTIME in the last 168 hours. Cardiac Enzymes: No results for input(s): CKTOTAL, CKMB, CKMBINDEX, TROPONINI in the last 168 hours. BNP (last 3 results) No results for input(s): PROBNP in the last 8760 hours. HbA1C: No results for input(s): HGBA1C in the last 72 hours. CBG: Recent Labs  Lab 01/17/20 2114 01/18/20 0738 01/18/20 1213 01/18/20 1831 01/19/20 0738  GLUCAP 169* 141* 139* 164* 139*   Lipid Profile: Recent Labs    01/18/20 0250  CHOL 107  HDL 20*  LDLCALC 65  TRIG 112  CHOLHDL 5.4   Thyroid Function Tests: Recent Labs    01/16/20 1448  TSH 2.116   Anemia Panel: No results for input(s): VITAMINB12, FOLATE, FERRITIN, TIBC, IRON, RETICCTPCT in the last 72 hours. Urine analysis:    Component Value Date/Time   COLORURINE RED (A) 01/17/2020 1630   APPEARANCEUR CLOUDY (A) 01/17/2020 1630   LABSPEC 1.023 01/17/2020 1630   PHURINE 6.0 01/17/2020 1630   GLUCOSEU NEGATIVE 01/17/2020 1630   HGBUR LARGE (A) 01/17/2020 1630   BILIRUBINUR NEGATIVE 01/17/2020 1630   KETONESUR NEGATIVE  01/17/2020 1630   PROTEINUR 100 (A) 01/17/2020 1630   UROBILINOGEN 0.2 08/31/2007 1343   NITRITE NEGATIVE 01/17/2020 1630   LEUKOCYTESUR NEGATIVE 01/17/2020 1630     Jeferson Boozer M.D. [facility] 01/19/2020, 12:54 PM   Call night coverage person covering after 7pm

## 2020-01-19 NOTE — Progress Notes (Signed)
Reviewed CT scan: 1. 32mm distal UVJ calculus without signifcant hydro 2. 12 mm left renal pelvis stone without obstruction  Gross hematuria likely worsened from right distal ureteral calculus.  Again, in this setting there is not urgency to proceed with urologic intervention without signs of infection.  Will adjust plan for bilateral ureteroscopy when it is safe from cardiac standpoint most likely as an outpatient unless patient develops infection and/or bilateral ureteral obstruction (evident by hydronephrosis, worsening renal function).  Patient can follow up as an outpatient for planning of this procedure. She may remain on anticoaguation.

## 2020-01-19 NOTE — Progress Notes (Signed)
  Echocardiogram TEE  has been performed.  Holly [name] 01/19/2020, 10:39 AM

## 2020-01-19 NOTE — Transfer of Care (Signed)
Immediate Anesthesia Transfer of Care Note  Patient: Holly [name]  Procedure(s) Performed: TRANSESOPHAGEAL ECHOCARDIOGRAM (TEE) (N/A ) CARDIOVERSION (N/A )  Patient Location: Endoscopy Unit  Anesthesia Type:General  Level of Consciousness: awake, alert  and oriented  Airway & Oxygen Therapy: Patient Spontanous Breathing  Post-op Assessment: Report given to RN, Post -op Vital signs reviewed and stable and Patient moving all extremities X 4  Post vital signs: Reviewed and stable  Last Vitals:  Vitals Value Taken Time  BP 103/57 01/19/20 1021  Temp 37 C 01/19/20 1021  Pulse 66 01/19/20 1022  Resp 24 01/19/20 1022  SpO2 96 % 01/19/20 1022  Vitals shown include unvalidated device data.  Last Pain:  Vitals:   01/19/20 1021  TempSrc: Axillary  PainSc:          Complications: No apparent anesthesia complications

## 2020-01-19 NOTE — Anesthesia Preprocedure Evaluation (Addendum)
Anesthesia Evaluation  Patient identified by MRN, date of birth, ID band Patient awake    Reviewed: Allergy & Precautions, NPO status , Patient's Chart, lab work & pertinent test results  Airway Mallampati: III  TM Distance: >3 FB Neck ROM: Full    Dental no notable dental hx. (+) Teeth Intact, Dental Advisory Given   Pulmonary shortness of breath, with exertion, at rest and lying, asthma , sleep apnea and Continuous Positive Airway Pressure Ventilation , pneumonia, resolved,    Pulmonary exam normal breath sounds clear to auscultation       Cardiovascular Normal cardiovascular exam+ dysrhythmias Atrial Fibrillation  Rhythm:Regular Rate:Normal     Neuro/Psych  Headaches, PSYCHIATRIC DISORDERS Anxiety Depression    GI/Hepatic Neg liver ROS, hiatal hernia, GERD  Medicated and Controlled,  Endo/Other  diabetes, Poorly Controlled, Type 2, Oral Hypoglycemic AgentsMorbid obesityHyperlipidemia  Renal/GU negative Renal ROS  negative genitourinary   Musculoskeletal  (+) Arthritis , Osteoarthritis,    Abdominal (+) + obese,   Peds  Hematology Eliquis - dlast dose 4/8   Anesthesia Other Findings   Reproductive/Obstetrics                            Anesthesia Physical Anesthesia Plan  ASA: III  Anesthesia Plan: General   Post-op Pain Management:    Induction: Intravenous  PONV Risk Score and Plan: 3 and Ondansetron and Treatment may vary due to age or medical condition  Airway Management Planned: Mask, Nasal Cannula and Natural Airway  Additional Equipment:   Intra-op Plan:   Post-operative Plan:   Informed Consent: I have reviewed the patients History and Physical, chart, labs and discussed the procedure including the risks, benefits and alternatives for the proposed anesthesia with the patient or authorized representative who has indicated his/her understanding and acceptance.     Dental  advisory given  Plan Discussed with: CRNA and Surgeon  Anesthesia Plan Comments:         Anesthesia Quick Evaluation

## 2020-01-19 NOTE — Anesthesia Postprocedure Evaluation (Signed)
Anesthesia Post Note  Patient: Holly [name]  Procedure(s) Performed: TRANSESOPHAGEAL ECHOCARDIOGRAM (TEE) (N/A ) CARDIOVERSION (N/A )     Patient location during evaluation: PACU Anesthesia Type: General Level of consciousness: awake and alert and oriented Pain management: pain level controlled Vital Signs Assessment: post-procedure vital signs reviewed and stable Respiratory status: spontaneous breathing, nonlabored ventilation and respiratory function stable Cardiovascular status: blood pressure returned to baseline and stable Postop Assessment: no apparent nausea or vomiting Anesthetic complications: no    Last Vitals:  Vitals:   01/19/20 1029 01/19/20 1041  BP: 126/79 122/72  Pulse: 62 65  Resp: 18 (!) 21  Temp:    SpO2: 96% 95%    Last Pain:  Vitals:   01/19/20 1041  TempSrc:   PainSc: 4                  Orry Sigl A.

## 2020-01-19 NOTE — CV Procedure (Signed)
TEE  Patient sedated by anesthesia with Propofol intravenously Throat anesthetized with viscous lidocaine and bite guard placed  TEE probe advanced to mid esophagus without difficulty   LA, LAA without massses Spontanous contrast seen in RA TV normal  Mild TR AV normal  Trivial AI MV normal  Mild MR PV normal  Trivial PI No PFO by color doppler  LVEF appear moderately depressed  Small to moderate effusion around heart  Aorta normal  FUll report to follow  [name] MD

## 2020-01-19 NOTE — Anesthesia Procedure Notes (Signed)
Procedure Name: MAC Date/Time: 01/19/2020 9:50 AM Performed by: Harden Mo, CRNA Pre-anesthesia Checklist: Patient identified, Emergency Drugs available, Suction available, Patient being monitored and Timeout performed Patient Re-evaluated:Patient Re-evaluated prior to induction Oxygen Delivery Method: Nasal cannula Preoxygenation: Pre-oxygenation with 100% oxygen Induction Type: IV induction Placement Confirmation: positive ETCO2 and breath sounds checked- equal and bilateral Dental Injury: Teeth and Oropharynx as per pre-operative assessment

## 2020-01-19 NOTE — CV Procedure (Signed)
Cardioversion  Pt already sedated by anesthesia for TEE  WIth pads in apex/base position, patient cardioverated to SR with 200 J synchronized biphasic energy   Procedure was without complication  12 lead EKG pending   [name] MD

## 2020-01-19 NOTE — Progress Notes (Signed)
Patient loaded up on stretcher for transfer to [facility] for procedure. Report given to EMS team, paper work handed off and signed consent sent with packet.

## 2020-01-19 NOTE — Progress Notes (Signed)
   Notified by RN that patient returned to [facility] following successful TEE/DCCV this morning. She has been bradycardic with HR predominately in the 40s-50s, occasionally as low as 30. She is asymptomatic. Will transition from IV to po amiodarone 200mg  BID. Will hold metoprolol at this time. Cardiology to see tomorrow.   Abigail Butts, PA-C 01/19/20; 1:42 PM

## 2020-01-19 NOTE — Interval H&P Note (Signed)
History and Physical Interval Note:  01/19/2020 9:18 AM  Holly [name]  has presented today for surgery, with the diagnosis of afib.  The various methods of treatment have been discussed with the patient and family. After consideration of risks, benefits and other options for treatment, the patient has consented to  Procedure(s): TRANSESOPHAGEAL ECHOCARDIOGRAM (TEE) (N/A) CARDIOVERSION (N/A) as a surgical intervention.  The patient's history has been reviewed, patient examined, no change in status, stable for surgery.  I have reviewed the patient's chart and labs.  Questions were answered to the patient's satisfaction.     [name]

## 2020-01-20 ENCOUNTER — Inpatient Hospital Stay (HOSPITAL_COMMUNITY): Payer: Medicare Other

## 2020-01-20 DIAGNOSIS — I313 Pericardial effusion (noninflammatory): Secondary | ICD-10-CM

## 2020-01-20 LAB — BASIC METABOLIC PANEL
Anion gap: 11 (ref 5–15)
BUN: 16 mg/dL (ref 6–20)
CO2: 21 mmol/L — ABNORMAL LOW (ref 22–32)
Calcium: 9.5 mg/dL (ref 8.9–10.3)
Chloride: 106 mmol/L (ref 98–111)
Creatinine, Ser: 0.88 mg/dL (ref 0.44–1.00)
GFR calc Af Amer: >60 mL/min (ref >60)
GFR calc non Af Amer: >60 mL/min (ref >60)
Glucose, Bld: 171 mg/dL — ABNORMAL HIGH (ref 70–99)
Potassium: 4.3 mmol/L (ref 3.5–5.1)
Sodium: 138 mmol/L (ref 135–145)

## 2020-01-20 LAB — CBC
HCT: 43.8 % (ref 36.0–46.0)
Hemoglobin: 13.7 g/dL (ref 12.0–15.0)
MCH: 26.4 pg (ref 26.0–34.0)
MCHC: 31.3 g/dL (ref 30.0–36.0)
MCV: 84.4 fL (ref 80.0–100.0)
Platelets: 242 10*3/uL (ref 150–400)
RBC: 5.19 MIL/uL — ABNORMAL HIGH (ref 3.87–5.11)
RDW: 16 % — ABNORMAL HIGH (ref 11.5–15.5)
WBC: 9.9 10*3/uL (ref 4.0–10.5)
nRBC: 0 % (ref 0.0–0.2)

## 2020-01-20 LAB — GLUCOSE, CAPILLARY
Glucose-Capillary: 125 mg/dL — ABNORMAL HIGH (ref 70–99)
Glucose-Capillary: 154 mg/dL — ABNORMAL HIGH (ref 70–99)
Glucose-Capillary: 178 mg/dL — ABNORMAL HIGH (ref 70–99)

## 2020-01-20 LAB — ECHOCARDIOGRAM LIMITED
Height: 66 in
Weight: 6179.93 oz

## 2020-01-20 MED ORDER — LOSARTAN POTASSIUM 25 MG PO TABS: 25.0000 mg | ORAL_TABLET | Freq: Every day | ORAL | 3 refills | Status: DC

## 2020-01-20 MED ORDER — LOSARTAN POTASSIUM 50 MG PO TABS
25.0000 mg | ORAL_TABLET | Freq: Every day | ORAL | Status: DC
  Administered 2020-01-20: 25 mg via ORAL
  Filled 2020-01-20: qty 1

## 2020-01-20 MED ORDER — COLCHICINE 0.6 MG PO TABS
0.6000 mg | ORAL_TABLET | Freq: Two times a day (BID) | ORAL | Status: DC
  Administered 2020-01-20: 0.6 mg via ORAL
  Filled 2020-01-20: qty 1

## 2020-01-20 MED ORDER — CARVEDILOL 3.125 MG PO TABS
3.1250 mg | ORAL_TABLET | Freq: Two times a day (BID) | ORAL | Status: DC
  Administered 2020-01-20: 3.125 mg via ORAL
  Filled 2020-01-20: qty 1

## 2020-01-20 MED ORDER — APIXABAN 5 MG PO TABS: 5.0000 mg | ORAL_TABLET | Freq: Two times a day (BID) | ORAL | 0 refills | Status: DC

## 2020-01-20 MED ORDER — CARVEDILOL 3.125 MG PO TABS: 3.1250 mg | ORAL_TABLET | Freq: Two times a day (BID) | ORAL | 3 refills | Status: DC

## 2020-01-20 MED ORDER — AMIODARONE HCL 200 MG PO TABS: 200.0000 mg | ORAL_TABLET | Freq: Two times a day (BID) | ORAL | 1 refills | Status: DC

## 2020-01-20 MED ORDER — COLCHICINE 0.6 MG PO TABS: 0.6000 mg | ORAL_TABLET | Freq: Two times a day (BID) | ORAL | 1 refills | Status: DC

## 2020-01-20 NOTE — Discharge Summary (Signed)
Physician Discharge Summary   Patient ID: Holly [name] MRN: [id number] DOB/AGE: 04/06/1966 54 y.o.  Admit date: 01/16/2020 Discharge date: 01/20/2020  Primary Care Physician:  Shirline Frees, MD   Recommendations for Outpatient Follow-up:  1. Follow up with PCP in 1-2 weeks 2. Please obtain BMP in one week   Home Health: None  Equipment/Devices:   Discharge Condition: stable CODE STATUS:  DNR   Diet recommendation:  carb modified diet   Discharge Diagnoses:    . Atrial fibrillation with RVR ([place]), new diagnosis Left nonobstructing renal calculi Moderate pericardial effusion Diabetes mellitus type 2 IDDM OSA Cough variant asthma Anxiety Hyperlipidemia Endometriosis Chronic venous changes Chronic headache   Consults:  Urology Cardiology    Allergies:   Allergies  Allergen Reactions  . Levaquin [Levofloxacin] Hives and Shortness Of Breath    Pt was on levaquin & vancomycin at same time & doesn't know which one caused the reaction  . Vancomycin Hives and Shortness Of Breath    Pt was levaquin & vancomycin at the same & doesn't know which one caused the reaction.    Donna Bernard [Liraglutide] Nausea Only  . Tape Rash     DISCHARGE MEDICATIONS: Allergies as of 01/20/2020      Reactions   Levaquin [levofloxacin] Hives, Shortness Of Breath   Pt was on levaquin & vancomycin at same time & doesn't know which one caused the reaction   Vancomycin Hives, Shortness Of Breath   Pt was levaquin & vancomycin at the same & doesn't know which one caused the reaction.     Victoza [liraglutide] Nausea Only   Tape Rash      Medication List    STOP taking these medications   benzonatate 200 MG capsule Commonly known as: TESSALON   HYDROcodone-ibuprofen 7.5-200 MG tablet Commonly known as: Vicoprofen     TAKE these medications   acetaminophen 650 MG CR tablet Commonly known as: TYLENOL Take 650 mg by mouth every 8 (eight) hours as needed for pain.   amiodarone  200 MG tablet Commonly known as: PACERONE Take 1 tablet (200 mg total) by mouth 2 (two) times daily.   apixaban 5 MG Tabs tablet Commonly known as: ELIQUIS Take 1 tablet (5 mg total) by mouth 2 (two) times daily.   atorvastatin 10 MG tablet Commonly known as: LIPITOR Take 10 mg by mouth at bedtime.   calcium carbonate 600 MG Tabs tablet Commonly known as: OS-CAL Take 600 mg by mouth 2 (two) times daily with a meal.   carvedilol 3.125 MG tablet Commonly known as: COREG Take 1 tablet (3.125 mg total) by mouth 2 (two) times daily with a meal.   cholecalciferol 1000 units tablet Commonly known as: VITAMIN D Take 1,000 Units by mouth 2 (two) times daily.   citalopram 40 MG tablet Commonly known as: CELEXA Take 40 mg by mouth every evening.   colchicine 0.6 MG tablet Take 1 tablet (0.6 mg total) by mouth 2 (two) times daily.   cyanocobalamin 1000 MCG/ML injection Commonly known as: (VITAMIN B-12) Inject 1,000 mcg into the muscle every 30 (thirty) days.   vitamin B-12 1000 MCG tablet Commonly known as: CYANOCOBALAMIN Take 1,000 mcg by mouth daily.   fluticasone 50 MCG/ACT nasal spray Commonly known as: FLONASE Place 2 sprays into both nostrils daily as needed for allergies or rhinitis.   HYDROcodone-acetaminophen 10-325 MG tablet Commonly known as: NORCO Take 1 tablet by mouth every 6 (six) hours as needed for pain.   insulin  NPH-regular Human (70-30) 100 UNIT/ML injection Inject 50 Units into the skin daily with supper.   Iron Supplement 325 (65 FE) MG tablet Generic drug: ferrous sulfate Take 325 mg by mouth daily with breakfast.   levocetirizine 5 MG tablet Commonly known as: XYZAL Take 5 mg by mouth at bedtime.   losartan 25 MG tablet Commonly known as: COZAAR Take 1 tablet (25 mg total) by mouth daily. Start taking on: January 21, 2020   meloxicam 15 MG tablet Commonly known as: MOBIC Take 15 mg by mouth every other day.   metFORMIN 500 MG 24 hr  tablet Commonly known as: GLUCOPHAGE-XR Take 1,000 mg by mouth 2 (two) times daily after a meal.   montelukast 10 MG tablet Commonly known as: SINGULAIR Take 10 mg by mouth at bedtime.   norethindrone 5 MG tablet Commonly known as: AYGESTIN Take 20 mg by mouth daily.   pantoprazole 40 MG tablet Commonly known as: PROTONIX Take 40 mg by mouth daily.   solifenacin 10 MG tablet Commonly known as: VESICARE Take 10 mg by mouth daily.   topiramate 100 MG tablet Commonly known as: TOPAMAX Take 150 mg by mouth every evening.   vitamin C 1000 MG tablet Take 1,000 mg by mouth in the morning and at bedtime.        Brief H and P: For complete details please refer to admission H and P, but in brief Holly Loftus Bouyeris a 54 y.o.femalewith medical history significant ofanxiety, depression, headaches, GERD, Obesity, OSA.Patient presented for an elective cystoscopy with retrograde pyelogram with left ureteroscopy and stent placement. In PACU, she developed significant tachycardia with rates up to 160 bpm. EKG significant for atrial fibrillation with RVR. Cardiology consulted and started patient on a Cardizem drip.  Hospital Course:   Atrial fibrillation with RVR, new diagnosis -Patient was placed on Cardizem drip, admitted to stepdown unit.   -No significant improvement with the diltiazem, hence was discontinued on 4/6 and started on IV amiodarone with loading dose by cardiology. -Continue Eliquis for stroke risk reduction, started inpatient  -2D echo showed large pericardial effusion, circumferentially, EF 35 to 40%.  -Underwent TEE and cardioversion on 4/9,  Small to moderate effusion around heart, EF moderately depressed.   - Now in NSR, doing well after cardioversion and maintaining NSR.  - Cardiology recommended discharge on amiodarone 200mg  BID, coreg 3.125mg  BID, eliquis 5mg  bid x 4 weeks, lipitor - Patient has follow-up with cardiology scheduled   Left nonobstructing renal  calculi -Initially planned elective cystoscopy with retrograde pyelogram, stent placement however developed A. fib with RVR in PACU -Plan for urological interventions outpatient after Eliquis is completed  Moderate pericardial effusion - 2D echo showed large pericardial effusion, circumferentially.  - Per cardiology, Dr. Radford Pax, pericardial effusion is likely moderate and posteriorly located with large fat pad anteriorly over the RV with no tamponade.  Patient had a URI a month ago, may have resulted in effusion.  -  avoid NSAIDs given that patient is on anticoagulation, recommendations per cardiology - limited echo done prior to discharge showed improved EF 55-60%, large pericardial effusion. Discussed with Dr.Turner and Dr Acie Fredrickson, no tamponade physiology on echo. Dr Radford Pax recommended colchicine and will obtain repeat echo next week outpatient. Cleared to discharge home.   Diabetes mellitus type 2, IDDM -Continue outpatient regimen CBGs currently controlled hemoglobin A1c 7.5  OSA CPAP nightly  Cough variant asthma Continue Flonase, Singulair  Anxiety Continue Celexa  Hyperlipidemia Continue Lipitor  GERD Continue PPI  Endometriosis -Continue norethindrone  Chronic venous changes Secondary to weight/edema. Associated scaly/dry skin -Eucerin BID -Wound consult for patient education for better management of legs  Chronic headaches -Continue Topamax   Morbid obesity Estimated body mass index is 62.34 kg/m as calculated from the following:   Height as of this encounter: 5\' 6"  (1.676 m).   Weight as of this encounter: 175.2 kg.   Day of Discharge S: doing well, maintaining NSR. Hoping to go home.   BP (!) 157/80   Pulse 77   Temp 97.7 F (36.5 C) (Oral)   Resp (!) 24   Ht 5\' 6"  (1.676 m)   Wt (!) 175.2 kg   SpO2 93%   BMI 62.34 kg/m   Physical Exam: General: Alert and awake oriented x3 not in any acute distress. HEENT: anicteric sclera,  pupils reactive to light and accommodation CVS: S1-S2 clear no murmur rubs or gallops Chest: clear to auscultation bilaterally, no wheezing rales or rhonchi Abdomen: soft nontender, nondistended, normal bowel sounds Extremities: no cyanosis, clubbing or edema noted bilaterally Neuro: Cranial nerves II-XII intact, no focal neurological deficits    Get Medicines reviewed and adjusted: Please take all your medications with you for your next visit with your Primary MD  Please request your Primary MD to go over all hospital tests and procedure/radiological results at the follow up. Please ask your Primary MD to get all Hospital records sent to his/her office.  If you experience worsening of your admission symptoms, develop shortness of breath, life threatening emergency, suicidal or homicidal thoughts you must seek medical attention immediately by calling 911 or calling your MD immediately  if symptoms less severe.  You must read complete instructions/literature along with all the possible adverse reactions/side effects for all the Medicines you take and that have been prescribed to you. Take any new Medicines after you have completely understood and accept all the possible adverse reactions/side effects.   Do not drive when taking pain medications.   Do not take more than prescribed Pain, Sleep and Anxiety Medications  Special Instructions: If you have smoked or chewed Tobacco  in the last 2 yrs please stop smoking, stop any regular Alcohol  and or any Recreational drug use.  Wear Seat belts while driving.  Please note  You were cared for by a hospitalist during your hospital stay. Once you are discharged, your primary care physician will handle any further medical issues. Please note that NO REFILLS for any discharge medications will be authorized once you are discharged, as it is imperative that you return to your primary care physician (or establish a relationship with a primary care  physician if you do not have one) for your aftercare needs so that they can reassess your need for medications and monitor your lab values.   The results of significant diagnostics from this hospitalization (including imaging, microbiology, ancillary and laboratory) are listed below for reference.      Procedures/Studies:  CT ABDOMEN PELVIS WO CONTRAST  Result Date: 01/18/2020 CLINICAL DATA:  Left flank pain. Gross hematuria. EXAM: CT ABDOMEN AND PELVIS WITHOUT CONTRAST TECHNIQUE: Multidetector CT imaging of the abdomen and pelvis was performed following the standard protocol without IV contrast. COMPARISON:  None. FINDINGS: Lower chest: Small pericardial effusion. Visualized lung bases are unremarkable. Hepatobiliary: Cholelithiasis noted. No biliary dilatation is noted. No abnormality seen in the liver on these unenhanced images. Pancreas: Unremarkable. No pancreatic ductal dilatation or surrounding inflammatory changes. Spleen: Normal in size without focal abnormality. Adrenals/Urinary Tract:  Adrenal glands appear normal. Bilateral nephrolithiasis is noted. Largest calculus measures 12 mm in the left renal pelvis. No hydronephrosis is noted. There appears to be a 9 mm calculus at the right ureterovesical junction which does not appear to result in significant ureteral dilatation. Urinary bladder is otherwise unremarkable. Stomach/Bowel: The stomach appears normal. The appendix is unremarkable. There is no evidence of bowel obstruction or inflammation. Stool is noted throughout the colon. Vascular/Lymphatic: No significant vascular findings are present. No enlarged abdominal or pelvic lymph nodes. Reproductive: Uterus and bilateral adnexa are unremarkable. Other: Large fat containing periumbilical hernia is noted. No ascites is noted. Musculoskeletal: No acute or significant osseous findings. IMPRESSION: 1. Bilateral nephrolithiasis. Largest calculus measures 12 mm in the left renal pelvis. No  hydronephrosis is noted. There appears to be a 9 mm calculus at the right ureterovesical junction which does not appear to result in significant ureteral dilatation. 2. Small pericardial effusion. 3. Cholelithiasis. 4. Large fat containing periumbilical hernia. Electronically Signed   By: Marijo Conception M.D.   On: 01/18/2020 16:23   ECHO TEE  Result Date: 01/19/2020    TRANSESOPHOGEAL ECHO REPORT   Patient Name:   Holly [name] Date of Exam: 01/19/2020 Medical Rec #:  [id number]        Height:       66.0 in Accession #:    [id number]       Weight:       386.2 lb Date of Birth:  [birth year]        BSA:          2.645 m Patient Age:    71 years         BP:           103/57 mmHg Patient Gender: F                HR:           66 bpm. Exam Location:  Inpatient Procedure: Transesophageal Echo Indications:    atrial fibrillation  History:        Patient has prior history of Echocardiogram examinations, most                 recent 01/17/2020. Risk Factors:Diabetes.  Sonographer:    Jannett Celestine RDCS (AE) Referring Phys: [id number] CADENCE H FURTH PROCEDURE: The transesophogeal probe was passed without difficulty through the esophogus of the patient. Sedation performed by different physician. The patient developed no complications during the procedure. A successful direct current cardioversion was  performed. IMPRESSIONS  1. Left ventricular ejection fraction, by estimation, is 35 to 40%. The left ventricle has moderately decreased function. The left ventricle demonstrates global hypokinesis.  2. Right ventricular systolic function is normal. The right ventricular size is normal.  3. No left atrial/left atrial appendage thrombus was detected.  4. The mitral valve is normal in structure. Moderate mitral valve regurgitation.  5. The aortic valve is normal in structure. Aortic valve regurgitation is mild. FINDINGS  Left Ventricle: Left ventricular ejection fraction, by estimation, is 35 to 40%. The left ventricle has moderately  decreased function. The left ventricle demonstrates global hypokinesis. The left ventricular internal cavity size was normal in size. There is no left ventricular hypertrophy. Right Ventricle: The right ventricular size is normal. Right vetricular wall thickness was not assessed. Right ventricular systolic function is normal. Left Atrium: Left atrial size was normal in size. No left atrial/left atrial appendage thrombus was detected. Right Atrium: Right atrial size was  normal in size. Pericardium: A small pericardial effusion is present. Mitral Valve: Multiple jets of MR. The mitral valve is normal in structure. Moderate mitral valve regurgitation. Tricuspid Valve: The tricuspid valve is normal in structure. Tricuspid valve regurgitation is trivial. Aortic Valve: The aortic valve is normal in structure. Aortic valve regurgitation is mild. Pulmonic Valve: The pulmonic valve was normal in structure. Pulmonic valve regurgitation is trivial. Aorta: The aortic root and ascending aorta are structurally normal, with no evidence of dilitation. IAS/Shunts: No atrial level shunt detected by color flow Doppler. Agitated saline contrast was given intravenously to evaluate for intracardiac shunting. [name] MD Electronically signed by [name] MD Signature Date/Time: 01/19/2020/5:07:26 PM    Final    ECHOCARDIOGRAM LIMITED  Result Date: 01/20/2020    ECHOCARDIOGRAM LIMITED REPORT   Patient Name:   Holly [name] Date of Exam: 01/20/2020 Medical Rec #:  [id number]        Height:       66.0 in Accession #:    [id number]       Weight:       386.2 lb Date of Birth:  [birth year]        BSA:          2.645 m Patient Age:    54 years         BP:           153/82 mmHg Patient Gender: F                HR:           86 bpm. Exam Location:  Inpatient Procedure: 2D Echo Indications:    pericardial effusion 423.9  History:        Patient has prior history of Echocardiogram examinations, most                 recent 01/19/2020. Risk  Factors:Sleep Apnea.  Sonographer:    Johny Chess Referring Phys: [id number] Jerett Odonohue K Fleurette Woolbright  Sonographer Comments: Patient is morbidly obese. IMPRESSIONS  1. Technically difficult echo due to body habitus and poor image quality. The LV function appears better than on previous echo. The large pericardial effusion persists. There is no evidence of pericardial tamponade.  2. Left ventricular ejection fraction, by estimation, is 55 to 60%. The left ventricle has normal function.  3. Right ventricular systolic function was not well visualized. The right ventricular size is not well visualized.  4. Large pericardial effusion. The pericardial effusion is circumferential.  5. The IVCs is dilated but there is collapse with inspiration . The inferior vena cava is dilated in size with >50% respiratory variability, suggesting right atrial pressure of 8 mmHg. FINDINGS  Left Ventricle: Left ventricular ejection fraction, by estimation, is 55 to 60%. The left ventricle has normal function. Right Ventricle: There is no diastilic collapse of the RV or RA. The right ventricular size is not well visualized. Right vetricular wall thickness was not assessed. Right ventricular systolic function was not well visualized. Pericardium: A large pericardial effusion is present. The pericardial effusion is circumferential. Venous: The IVCs is dilated but there is collapse with inspiration. The inferior vena cava is dilated in size with greater than 50% respiratory variability, suggesting right atrial pressure of 8 mmHg. Additional Comments: Technically difficult echo due to body habitus and poor image quality. The LV function appears better than on previous echo. The large pericardial effusion persists. There is no evidence of pericardial tamponade.  LEFT VENTRICLE PLAX 2D LVIDd:  5.10 cm LVIDs:         3.70 cm LV PW:         1.00 cm LV IVS:        1.10 cm LVOT diam:     2.20 cm LV SV:         74 LV SV Index:   28 LVOT Area:     3.80 cm   LEFT ATRIUM         Index LA diam:    3.70 cm 1.40 cm/m  AORTIC VALVE LVOT Vmax:   104.00 cm/s LVOT Vmean:  65.200 cm/s LVOT VTI:    0.194 m MITRAL VALVE MV Area (PHT): 2.73 cm     SHUNTS MV Decel Time: 278 msec     Systemic VTI:  0.19 m MV E velocity: 101.00 cm/s  Systemic Diam: 2.20 cm MV A velocity: 70.70 cm/s MV E/A ratio:  1.43 Mertie Moores MD Electronically signed by Mertie Moores MD Signature Date/Time: 01/20/2020/4:31:34 PM    Final    ECHOCARDIOGRAM LIMITED  Result Date: 01/17/2020    ECHOCARDIOGRAM LIMITED REPORT   Patient Name:   Holly [name] Date of Exam: 01/17/2020 Medical Rec #:  [id number]        Height:       66.0 in Accession #:    [id number]       Weight:       386.2 lb Date of Birth:  1965/12/22        BSA:          2.645 m Patient Age:    63 years         BP:           148/130 mmHg Patient Gender: F                HR:           111 bpm. Exam Location:  Inpatient Procedure: Limited Echo and Cardiac Doppler Indications:    Pericardial effusion 423.9/I31.3  History:        Patient has no prior history of Echocardiogram examinations and                 Patient has prior history of Echocardiogram examinations, most                 recent 01/17/2020. Arrythmias:Atrial Fibrillation; Risk                 Factors:Diabetes and Sleep Apnea.  Sonographer:    Clayton Lefort RDCS (AE) Referring Phys: [id number] CADENCE H FURTH  Sonographer Comments: Technically difficult study due to poor echo windows and patient is morbidly obese. Image acquisition challenging due to patient body habitus. IMPRESSIONS  1. RV cavity appears small, no clear diastolic collapse. Right ventricular systolic function is normal. The right ventricular size is normal.  2. Promient apical fat pad is noted. moderate to large circumferential pericardial effusion (measuring between 1.8-2.4 cm).  3. No clear significant respiratory inflow variation.  4. Borderline tricuspid inflow variation with respiration. Comparison(s): Prior images  reviewed side by side. 01/17/20: LVEF 35-40%, large pericardial effusion, dilated IVC. Conclusion(s)/Recommendation(s): Comparing both sets of images, there is a moderate to large (>2.0 cm) circumferential pericardial effusion, as well as a promient apical fat pad. The IVC is diltated and there is minimal tricuspid, but no significant mitral inflow variation with respiration. The RV appears normal diastolic collapse is not noted. Tamponade physiology is difficult to ascertain in the setting  of cardiomyopathy, but not suspected. Vitals indicate significant hypertension, which does not suggest tamponade. Clinical correlation is always recommended. FINDINGS  Right Ventricle: RV cavity appears small, no clear diastolic collapse. The right ventricular size is normal. No increase in right ventricular wall thickness. Right ventricular systolic function is normal. Pericardium: Promient apical fat pad is noted. Moderate to large circumferential pericardial effusion (measuring between 1.8-2.4 cm). Presence of pericardial fat pad. Mitral Valve: No clear significant respiratory inflow variation. Tricuspid Valve: Borderline tricuspid inflow variation with respiration. Lyman Bishop MD Electronically signed by Lyman Bishop MD Signature Date/Time: 01/17/2020/7:56:35 PM    Final    ECHOCARDIOGRAM LIMITED  Result Date: 01/17/2020    ECHOCARDIOGRAM LIMITED REPORT   Patient Name:   Holly [name] Date of Exam: 01/17/2020 Medical Rec #:  [id number]        Height:       66.0 in Accession #:    [id number]       Weight:       385.2 lb Date of Birth:  17-Jan-1966        BSA:          2.642 m Patient Age:    89 years         BP:           151/117 mmHg Patient Gender: F                HR:           125 bpm. Exam Location:  Inpatient Procedure: Limited Echo, Intracardiac Opacification Agent, Cardiac Doppler and            Color Doppler Indications:    Atrial fibrillation  History:        Patient has no prior history of Echocardiogram  examinations.                 Morbid obesity, Arrythmias:Atrial Fibrillation; Risk                 Factors:Sleep Apnea and Diabetes. Preop for kidney stones.  Sonographer:    Dustin Flock Referring Phys: [phone] RALPH A NETTEY  Sonographer Comments: Technically difficult study due to poor echo windows and patient is morbidly obese. Image acquisition challenging due to patient body habitus. IMPRESSIONS  1. Very limited echo with poor windows, Definity contrast given - there appears to be moderate systolic dysfunction with large pericardial effusion. No prior study for comparison.  2. Left ventricular ejection fraction, by estimation, is 35 to 40%. The left ventricle has moderately decreased function. The left ventricle demonstrates global hypokinesis. There is moderate left ventricular hypertrophy. Left ventricular diastolic function could not be evaluated.  3. Right ventricular systolic function is mildly reduced. The right ventricular size is normal. Moderately increased right ventricular wall thickness.  4. Large circumferential pericardial effusion measuring about 2.5 cm. There may also be pericaridal fat. Images are limited.  5. Study was not sufficient to exclude possible tamponade physiology - difficult images due to body habitus. Consider repeat study, TEE or right heart cath to further evaluate.. Large pericardial effusion. The pericardial effusion is circumferential.  6. The inferior vena cava is dilated in size with <50% respiratory variability, suggesting right atrial pressure of 15 mmHg.  7. Left atrial size was moderately dilated. FINDINGS  Left Ventricle: Left ventricular ejection fraction, by estimation, is 35 to 40%. The left ventricle has moderately decreased function. The left ventricle demonstrates global hypokinesis. Definity contrast agent was given IV to delineate the  left ventricular endocardial borders. There is moderate left ventricular hypertrophy. Left ventricular diastolic function  could not be evaluated due to atrial fibrillation. Right Ventricle: The right ventricular size is normal. Moderately increased right ventricular wall thickness. Right ventricular systolic function is mildly reduced. Left Atrium: Left atrial size was moderately dilated. Pericardium: Study was not sufficient to exclude possible tamponade physiology - difficult images due to body habitus. Consider repeat study, TEE or right heart cath to further evaluate. A large pericardial effusion is present. The pericardial effusion is circumferential. The pericardial effusion appears to contain mixed echogenic material. Venous: The inferior vena cava is dilated in size with less than 50% respiratory variability, suggesting right atrial pressure of 15 mmHg.  LEFT VENTRICLE PLAX 2D LVIDd:         4.00 cm Diastology LVIDs:         3.00 cm LV e' lateral:   5.98 cm/s LV PW:         1.40 cm LV E/e' lateral: 16.5 LV IVS:        1.30 cm LV e' medial:    5.98 cm/s                        LV E/e' medial:  16.5  MITRAL VALVE MV Area (PHT): 12.04 cm MV Decel Time: 63 msec MV E velocity: 98.50 cm/s MV A velocity: 46.50 cm/s MV E/A ratio:  2.12 Lyman Bishop MD Electronically signed by Lyman Bishop MD Signature Date/Time: 01/17/2020/10:42:30 AM    Final       LAB RESULTS: Basic Metabolic Panel: Recent Labs  Lab 01/18/20 0921 01/18/20 0921 01/19/20 0308 01/20/20 0247  NA 138   < > 140 138  K 4.7   < > 4.4 4.3  CL 111   < > 106 106  CO2 21*   < > 23 21*  GLUCOSE 139*   < > 175* 171*  BUN 14   < > 17 16  CREATININE 1.15*   < > 1.07* 0.88  CALCIUM 9.2   < > 9.1 9.5  MG 1.7  --   --   --    < > = values in this interval not displayed.   Liver Function Tests: No results for input(s): AST, ALT, ALKPHOS, BILITOT, PROT, ALBUMIN in the last 168 hours. No results for input(s): LIPASE, AMYLASE in the last 168 hours. No results for input(s): AMMONIA in the last 168 hours. CBC: Recent Labs  Lab 01/19/20 0308 01/19/20 0308  01/20/20 0247  WBC 9.6  --  9.9  HGB 14.0  --  13.7  HCT 45.2  --  43.8  MCV 85.4   < > 84.4  PLT 222  --  242   < > = values in this interval not displayed.   Cardiac Enzymes: No results for input(s): CKTOTAL, CKMB, CKMBINDEX, TROPONINI in the last 168 hours. BNP: Invalid input(s): POCBNP CBG: Recent Labs  Lab 01/20/20 1157 01/20/20 1701  GLUCAP 154* 178*       Disposition and Follow-up: Discharge Instructions    Diet - low sodium heart healthy   Complete by: As directed    Diet Carb Modified   Complete by: As directed    Increase activity slowly   Complete by: As directed    Increase activity slowly   Complete by: As directed        DISPOSITION: [place]    Melina Copa  N, PA-C Follow up on 02/08/2020.   Specialties: Cardiology, Radiology Why: Cardiology Follow-up on 02/08/2020 at 8:15 AM with Melina Copa, PA-C (works with Dr. Radford Pax). [facility] should also contact you within the next 1-2 business days to arrange follow-up as well.  Contact information: [address] [place] [address] [phone]        Sueanne Margarita, MD. Schedule an appointment as soon as possible for a visit in 3 week(s).   Specialty: Cardiology Contact information: [id number] [address] [phone]        Jacalyn Lefevre D, MD. Schedule an appointment as soon as possible for a visit in 3 week(s).   Specialty: Urology Contact information: [address] [place] [address] [phone]            Time coordinating discharge:  17mins   Signed:   Estill Cotta M.D. [facility] 01/20/2020, 5:19 PM

## 2020-01-20 NOTE — Progress Notes (Signed)
Discharge instructions given to and discussed with patient and patient's sister. All questions answered. IV removed. NT assisted patient with dressing and pt was sent home at 1850.

## 2020-01-20 NOTE — Progress Notes (Addendum)
Progress Note  Patient Name: Holly [name] Date of Encounter: 01/20/2020  Primary Cardiologist: Fransico Him, MD   Subjective   Had successful TEE/DCCV to sinus brady yesterday.  Maintaining NSR on exam.  Anxious to go home  Inpatient Medications    Scheduled Meds: . amiodarone  200 mg Oral BID  . apixaban  5 mg Oral BID  . atorvastatin  10 mg Oral QHS  . calcium carbonate  1 tablet Oral BID WC  . Chlorhexidine Gluconate Cloth  6 each Topical Daily  . cholecalciferol  1,000 Units Oral BID  . citalopram  40 mg Oral QHS  . darifenacin  15 mg Oral Daily  . ferrous sulfate  325 mg Oral Q breakfast  . hydrocerin   Topical BID  . insulin aspart  0-15 Units Subcutaneous TID WC  . insulin aspart  0-5 Units Subcutaneous QHS  . insulin glargine  30 Units Subcutaneous QHS  . loratadine  10 mg Oral QHS  . mouth rinse  15 mL Mouth Rinse BID  . montelukast  10 mg Oral QHS  . norethindrone  20 mg Oral Daily  . pantoprazole  40 mg Oral Daily  . topiramate  150 mg Oral QPM  . vitamin B-12  1,000 mcg Oral Daily   Continuous Infusions:  PRN Meds: acetaminophen **OR** acetaminophen, camphor-menthol, diphenhydrAMINE, fluticasone, HYDROcodone-acetaminophen, ondansetron (ZOFRAN) IV   Vital Signs    Vitals:   01/20/20 0438 01/20/20 0500 01/20/20 0600 01/20/20 0800  BP:   (!) 145/87 (!) 153/82  Pulse:  73 65 78  Resp:  (!) 22 19 (!) 26  Temp: 97.8 F (36.6 C)     TempSrc: Oral     SpO2:  96% 90% 96%  Weight:      Height:        Intake/Output Summary (Last 24 hours) at 01/20/2020 1027 Last data filed at 01/20/2020 0600 Gross per 24 hour  Intake 1773.21 ml  Output 1525 ml  Net 248.21 ml   Last 3 Weights 01/17/2020 01/11/2020 01/10/2020  Weight (lbs) 386 lb 3.9 oz 385 lb 4 oz 385 lb  Weight (kg) 175.2 kg 174.748 kg 174.635 kg      Telemetry    NSR - Personally Reviewed  ECG    NSR with anterior infarct and no ST changes - Personally Reviewed  Physical Exam   GEN: Well  nourished, well developed in no acute distress HEENT: Normal NECK: No JVD; No carotid bruits LYMPHATICS: No lymphadenopathy CARDIAC:RRR, no murmurs, rubs, gallops RESPIRATORY:  Clear to auscultation without rales, wheezing or rhonchi  ABDOMEN: Soft, non-tender, non-distended MUSCULOSKELETAL:  No edema; No deformity  SKIN: Warm and dry NEUROLOGIC:  Alert and oriented x 3 PSYCHIATRIC:  Normal affect    Labs    High Sensitivity Troponin:  No results for input(s): TROPONINIHS in the last 720 hours.    Chemistry Recent Labs  Lab 01/18/20 0921 01/19/20 0308 01/20/20 0247  NA 138 140 138  K 4.7 4.4 4.3  CL 111 106 106  CO2 21* 23 21*  GLUCOSE 139* 175* 171*  BUN 14 17 16   CREATININE 1.15* 1.07* 0.88  CALCIUM 9.2 9.1 9.5  GFRNONAA 55* >60 >60  GFRAA >60 >60 >60  ANIONGAP 6 11 11      Hematology Recent Labs  Lab 01/18/20 0921 01/19/20 0308 01/20/20 0247  WBC 10.2 9.6 9.9  RBC 5.78* 5.29* 5.19*  HGB 15.2* 14.0 13.7  HCT 48.1* 45.2 43.8  MCV 83.2 85.4 84.4  MCH 26.3 26.5 26.4  MCHC 31.6 31.0 31.3  RDW 16.4* 16.3* 16.0*  PLT 248 222 242    BNPNo results for input(s): BNP, PROBNP in the last 168 hours.   DDimer No results for input(s): DDIMER in the last 168 hours.   Radiology    CT ABDOMEN PELVIS WO CONTRAST  Result Date: 01/18/2020 CLINICAL DATA:  Left flank pain. Gross hematuria. EXAM: CT ABDOMEN AND PELVIS WITHOUT CONTRAST TECHNIQUE: Multidetector CT imaging of the abdomen and pelvis was performed following the standard protocol without IV contrast. COMPARISON:  None. FINDINGS: Lower chest: Small pericardial effusion. Visualized lung bases are unremarkable. Hepatobiliary: Cholelithiasis noted. No biliary dilatation is noted. No abnormality seen in the liver on these unenhanced images. Pancreas: Unremarkable. No pancreatic ductal dilatation or surrounding inflammatory changes. Spleen: Normal in size without focal abnormality. Adrenals/Urinary Tract: Adrenal glands  appear normal. Bilateral nephrolithiasis is noted. Largest calculus measures 12 mm in the left renal pelvis. No hydronephrosis is noted. There appears to be a 9 mm calculus at the right ureterovesical junction which does not appear to result in significant ureteral dilatation. Urinary bladder is otherwise unremarkable. Stomach/Bowel: The stomach appears normal. The appendix is unremarkable. There is no evidence of bowel obstruction or inflammation. Stool is noted throughout the colon. Vascular/Lymphatic: No significant vascular findings are present. No enlarged abdominal or pelvic lymph nodes. Reproductive: Uterus and bilateral adnexa are unremarkable. Other: Large fat containing periumbilical hernia is noted. No ascites is noted. Musculoskeletal: No acute or significant osseous findings. IMPRESSION: 1. Bilateral nephrolithiasis. Largest calculus measures 12 mm in the left renal pelvis. No hydronephrosis is noted. There appears to be a 9 mm calculus at the right ureterovesical junction which does not appear to result in significant ureteral dilatation. 2. Small pericardial effusion. 3. Cholelithiasis. 4. Large fat containing periumbilical hernia. Electronically Signed   By: Marijo Conception M.D.   On: 01/18/2020 16:23   ECHO TEE  Result Date: 01/19/2020    TRANSESOPHOGEAL ECHO REPORT   Patient Name:   Holly [name] Date of Exam: 01/19/2020 Medical Rec #:  [id number]        Height:       66.0 in Accession #:    [id number]       Weight:       386.2 lb Date of Birth:  1966/05/06        BSA:          2.645 m Patient Age:    54 years         BP:           103/57 mmHg Patient Gender: F                HR:           66 bpm. Exam Location:  Inpatient Procedure: Transesophageal Echo Indications:    atrial fibrillation  History:        Patient has prior history of Echocardiogram examinations, most                 recent 01/17/2020. Risk Factors:Diabetes.  Sonographer:    Jannett Celestine RDCS (AE) Referring Phys: [id number] CADENCE  H FURTH PROCEDURE: The transesophogeal probe was passed without difficulty through the esophogus of the patient. Sedation performed by different physician. The patient developed no complications during the procedure. A successful direct current cardioversion was  performed. IMPRESSIONS  1. Left ventricular ejection fraction, by estimation, is 35 to 40%. The left ventricle has moderately decreased  function. The left ventricle demonstrates global hypokinesis.  2. Right ventricular systolic function is normal. The right ventricular size is normal.  3. No left atrial/left atrial appendage thrombus was detected.  4. The mitral valve is normal in structure. Moderate mitral valve regurgitation.  5. The aortic valve is normal in structure. Aortic valve regurgitation is mild. FINDINGS  Left Ventricle: Left ventricular ejection fraction, by estimation, is 35 to 40%. The left ventricle has moderately decreased function. The left ventricle demonstrates global hypokinesis. The left ventricular internal cavity size was normal in size. There is no left ventricular hypertrophy. Right Ventricle: The right ventricular size is normal. Right vetricular wall thickness was not assessed. Right ventricular systolic function is normal. Left Atrium: Left atrial size was normal in size. No left atrial/left atrial appendage thrombus was detected. Right Atrium: Right atrial size was normal in size. Pericardium: A small pericardial effusion is present. Mitral Valve: Multiple jets of MR. The mitral valve is normal in structure. Moderate mitral valve regurgitation. Tricuspid Valve: The tricuspid valve is normal in structure. Tricuspid valve regurgitation is trivial. Aortic Valve: The aortic valve is normal in structure. Aortic valve regurgitation is mild. Pulmonic Valve: The pulmonic valve was normal in structure. Pulmonic valve regurgitation is trivial. Aorta: The aortic root and ascending aorta are structurally normal, with no evidence of  dilitation. IAS/Shunts: No atrial level shunt detected by color flow Doppler. Agitated saline contrast was given intravenously to evaluate for intracardiac shunting. [name] MD Electronically signed by [name] MD Signature Date/Time: 01/19/2020/5:07:26 PM    Final     Cardiac Studies   Echo  1. Very limited echo with poor windows, Definity contrast given - there  appears to be moderate systolic dysfunction with large pericardial  effusion. No prior study for comparison.  2. Left ventricular ejection fraction, by estimation, is 35 to 40%. The  left ventricle has moderately decreased function. The left ventricle  demonstrates global hypokinesis. There is moderate left ventricular  hypertrophy. Left ventricular diastolic  function could not be evaluated.  3. Right ventricular systolic function is mildly reduced. The right  ventricular size is normal. Moderately increased right ventricular wall  thickness.  4. Large circumferential pericardial effusion measuring about 2.5 cm.  There may also be pericaridal fat. Images are limited.  5. Study was not sufficient to exclude possible tamponade physiology -  difficult images due to body habitus. Consider repeat study, TEE or right  heart cath to further evaluate.. Large pericardial effusion. The  pericardial effusion is circumferential.  6. The inferior vena cava is dilated in size with <50% respiratory  variability, suggesting right atrial pressure of 15 mmHg.  7. Left atrial size was moderately dilated.   Limited Echo  1. RV cavity appears small, no clear diastolic collapse. Right  ventricular systolic function is normal. The right ventricular size is  normal.  2. Promient apical fat pad is noted. moderate to large circumferential  pericardial effusion (measuring between 1.8-2.4 cm).  3. No clear significant respiratory inflow variation.  4. Borderline tricuspid inflow variation with respiration.   Comparison(s): Prior images  reviewed side by side. 01/17/20: LVEF 35-40%,  large pericardial effusion, dilated IVC.   Conclusion(s)/Recommendation(s): Comparing both sets of images, there is a  moderate to large (>2.0 cm) circumferential pericardial effusion, as well  as a promient apical fat pad. The IVC is diltated and there is minimal  tricuspid, but no significant  mitral inflow variation with respiration. The RV appears normal diastolic  collapse  is not noted. Tamponade physiology is difficult to ascertain in  the setting of cardiomyopathy, but not suspected. Vitals indicate  significant hypertension, which does not  suggest tamponade. Clinical correlation is always recommended.    TEE 01/19/2020 IMPRESSIONS   1. Left ventricular ejection fraction, by estimation, is 35 to 40%. The  left ventricle has moderately decreased function. The left ventricle  demonstrates global hypokinesis.  2. Right ventricular systolic function is normal. The right ventricular  size is normal.  3. No left atrial/left atrial appendage thrombus was detected.  4. The mitral valve is normal in structure. Moderate mitral valve  regurgitation.  5. The aortic valve is normal in structure. Aortic valve regurgitation is  mild.    Patient Profile     54 y.o. female with a hx of severeosteoarthritis B/Lknees,obesity,diabetes,chronic back pain, HLD, andDepression/anxietywho is being seen today for the evaluation of Afib RVR. Echo showed EF 35-40% with pericardial effusion.   Assessment & Plan    New onset Afib RVR Patient arrived to her scheduled procedure forleft calculus/severe back painand converted to afib RVR in the OR. - No prior history of afib and patient is asymptomatic - CHADSVASC = 2 (female, DM)  - started on Eliquis 5mg  BID.  - Echo showed EF 35-40%, global hypokinesis, mildly reduced RV function and moderate posterior pericardial effusion - potassium 4.3 today - Keep Mag>1.7 - TSH 2.11 - S/P TEE/DCCV to  NSR and maintaining NSR on tele - continue Eliquis 5mg  BID  - continue amio 200mg  BID for now and consider stopping after 4 weeks given her young age - will get her followup in afib clinic  Pericardial effusion - repeat limited echo showed moderate posterior pericardial effusion with no tamponade - no mention of effusion on TEE - Sed rate 28 - CRP 3.7 - will repeat limited echo today now that she is in NSR to see if we can get better images of pericardium  Cardiomyopathy - EF estimated at 35-40% - TEE showed moderately reduced LVF with EF 35-40% with global HK - likely related to tachy mediated process and hopefully will resolve with restoring NSR - repeat 2D echo in 2 months - BP elevated so will start Carvedilol 3.125mg  BID and Losartan 25mg  daily - check BMET in am or in 1 week if discharged today  Left renal calculus - planned for elective procedureureteroscopy with laser lithotripsy and retrograde pyelogram and possible stent placement>>held for the above - plan to proceed once patient is stable   HLD - at baseline is on atorvastatin 10 mg daily - LDL 65  DM2 on insulin - A1C 7.5% 01/11/20  If 2D echo limited today is stable with no worsening of pericardial effusion and no worsening of LVF then ok to discharge home from cardiac standpoint today.   I have spent a total of 35 minutes with patient reviewing TEE and cardioversion note, hospital notes , telemetry, EKGs, labs and examining patient as well as establishing an assessment and plan that was discussed with the patient.  > 50% of time was spent in direct patient care.    [facility] will sign off.   Medication Recommendations:  Amio 200mg  BID, Carvedilol 3.125mg  BID, Losartan 25mg  daily, Eliquis 5mg  BID, Lipitor 10mg  daily Other recommendations (labs, testing, etc):  BMET in 1 week Follow up as an outpatient:  afib clinic in 1 week and me in 3 weeks  For questions or updates, please contact [place] Please  consult [email or web address] for contact info  under        Signed, Fransico Him, MD  01/20/2020, 10:27 AM

## 2020-01-20 NOTE — Progress Notes (Signed)
  Echocardiogram 2D Echocardiogram has been performed.  Holly [name] 01/20/2020, 2:15 PM

## 2020-01-25 ENCOUNTER — Telehealth: Payer: Self-pay | Admitting: Cardiology

## 2020-01-25 DIAGNOSIS — I3139 Other pericardial effusion (noninflammatory): Secondary | ICD-10-CM

## 2020-01-25 DIAGNOSIS — I313 Pericardial effusion (noninflammatory): Secondary | ICD-10-CM

## 2020-01-25 NOTE — Telephone Encounter (Signed)
Yes the issues is with potential increase in colchicine concentration.  Below is the recommendations based on indication. Patient has normal renal function  Patient Management In patients with normal renal and hepatic function using the [employer] branded product, dose reductions are required when combined with, or when used within 14 days of, P-glycoprotein (P-gp) inhibitors. For gout flare treatment: reduce initial dose to 0.6 mg x 1 dose, with next dose no sooner than 3 days later. For gout flare prophylaxis: if target dose would otherwise be 0.6 mg daily, change to 0.3 mg every other day, and if target dose would otherwise be 0.6 mg twice daily, change to 0.3 mg daily. For treatment of Familial Mediterranean Fever: decrease dose to no more than 0.6 mg per day (0.3 mg twice daily). Additionally, patients receiving colchicine for gout prophylaxis who are being treated with or recently completed treatment with a drug that is a CYP3A4 or P-gp inhibitor, should not be treated with colchicine for gout flares.  Case reports are more with verapamil, cyclosporin and clarithromycin however.  However, since this is an issue more with changing the colchicine dose- Dr. Nira Conn should be contacted about decreasing colchicine dose.

## 2020-01-25 NOTE — Telephone Encounter (Signed)
Pt c/o medication issue:  1. Name of Medication:  amiodarone (PACERONE) 200 MG tablet colchicine 0.6 MG tablet  2. How are you currently taking this medication (dosage and times per day)? Not currently taking  3. Are you having a reaction (difficulty breathing--STAT)? no  4. What is your medication issue? Patient states her pharmacy will not fill the prescriptions, because they have a bad interaction.

## 2020-01-25 NOTE — Telephone Encounter (Signed)
Patient states that her pharmacy would not fill her amiodarone and colchicine due to contraindications. The pharmacist told her that he would be getting in touch with her doctor but she is unsure who he was going to speak with. This was last Saturday and she has not heard anything. She was not given either of the medications. Advised patient that I would send to Dr. Radford Pax for review.

## 2020-01-25 NOTE — Telephone Encounter (Signed)
Holly [name]  Is there an issue with Colchicine and AMio?  Preslee Regas

## 2020-01-25 NOTE — Telephone Encounter (Signed)
Actually I put her on it at discharge because she had a moderate to large pericardial effusion.  What dose would you recommend and do I need to follow any drug levels of anything?

## 2020-01-26 MED ORDER — COLCHICINE 0.6 MG PO TABS: 0.3000 mg | ORAL_TABLET | Freq: Every day | ORAL | 3 refills | Status: DC

## 2020-01-26 NOTE — Telephone Encounter (Signed)
Spoke with the patient and advised her that we were going to reduce the dose of colchicine to 0.3mg  daily. I have also scheduled her for an echocardiogram.   Spoke with the pharmacy and advised them that I have sent in a new prescription for a reduced dose of colchicine and that we are aware of the interactions and the patient is okay to take both colchicine and amiodarone. Pharmacist advised that she would make a note and fill the prescriptions.

## 2020-01-26 NOTE — Telephone Encounter (Signed)
I would recommend decreasing dose to 0.3mg  daily.

## 2020-01-26 NOTE — Telephone Encounter (Signed)
Please decrease Colchicine to 0.3mg  daily and repeat 2D echo in 1 week for pericardial effusion

## 2020-01-30 ENCOUNTER — Ambulatory Visit (HOSPITAL_COMMUNITY)
Admission: RE | Admit: 2020-01-30 | Discharge: 2020-01-30 | Disposition: A | Discharge status: Home / Self Care | Payer: Medicare Other | Source: Ambulatory Visit | Attending: Nurse Practitioner | Admitting: Nurse Practitioner

## 2020-01-30 ENCOUNTER — Other Ambulatory Visit: Payer: Self-pay

## 2020-01-30 VITALS — BP 126/80 | HR 65 | Ht 66.0 in | Wt 374.0 lb

## 2020-01-30 DIAGNOSIS — R109 Unspecified abdominal pain: Secondary | ICD-10-CM | POA: Diagnosis not present

## 2020-01-30 DIAGNOSIS — I313 Pericardial effusion (noninflammatory): Secondary | ICD-10-CM | POA: Insufficient documentation

## 2020-01-30 DIAGNOSIS — E119 Type 2 diabetes mellitus without complications: Secondary | ICD-10-CM | POA: Diagnosis not present

## 2020-01-30 DIAGNOSIS — Z7902 Long term (current) use of antithrombotics/antiplatelets: Secondary | ICD-10-CM | POA: Insufficient documentation

## 2020-01-30 DIAGNOSIS — I4891 Unspecified atrial fibrillation: Secondary | ICD-10-CM | POA: Diagnosis not present

## 2020-01-30 DIAGNOSIS — K219 Gastro-esophageal reflux disease without esophagitis: Secondary | ICD-10-CM | POA: Diagnosis not present

## 2020-01-30 DIAGNOSIS — Z7901 Long term (current) use of anticoagulants: Secondary | ICD-10-CM | POA: Diagnosis not present

## 2020-01-30 DIAGNOSIS — Z794 Long term (current) use of insulin: Secondary | ICD-10-CM | POA: Insufficient documentation

## 2020-01-30 DIAGNOSIS — G4730 Sleep apnea, unspecified: Secondary | ICD-10-CM | POA: Insufficient documentation

## 2020-01-30 DIAGNOSIS — F329 Major depressive disorder, single episode, unspecified: Secondary | ICD-10-CM | POA: Diagnosis not present

## 2020-01-30 DIAGNOSIS — D6869 Other thrombophilia: Secondary | ICD-10-CM

## 2020-01-30 DIAGNOSIS — Z79899 Other long term (current) drug therapy: Secondary | ICD-10-CM | POA: Diagnosis not present

## 2020-01-30 DIAGNOSIS — M170 Bilateral primary osteoarthritis of knee: Secondary | ICD-10-CM | POA: Insufficient documentation

## 2020-01-31 ENCOUNTER — Encounter (HOSPITAL_COMMUNITY): Payer: Self-pay | Admitting: Nurse Practitioner

## 2020-01-31 NOTE — Progress Notes (Signed)
Primary Care Physician: Shirline Frees, MD Referring Physician: [facility] f/u Cardiologist: Dr. Suzette Battiest is a 54 y.o. female with a h/o morbid obesity,anxiety, depression,  DM, Sleep apnea treated with cpap, and recent kidney stones that presented for an elective cystoscopy 4/6 with retrograde pyelogram with left ureteroscopy and stent placement. In PACU, she developed significant tachycardia with rates up to 160 bpm. EKG significant for atrial fibrillation with RVR. Cardiology consulted and started patient on a Cardizem drip and admitted to stepdown unit. No improvement with cardizem drip and was started ohn IV amiodarone and ultimately underwent TEE/DCCV which was successful to restore SR. Urological procedures to be done later on outpt basis. 2 D Echo showed large pericardial effusion and was placed on colchicine on d/c.  She was d/c 4/10.   Pt is now in the afib clinic for f/u. SHe states there was discussion at the pharmcay for interaction between colchicine and amiodarone interaction and did not start taking either med until this past Monday, the 19th. Colchicine dose was reduced form the prior ordered dose. She  has not noted any further afib. She is in SR today.   She  denies alcohol use, no significant caffeine use. Uses CPAP. Has lost 50 lbs, unintentionally since the last of the year. She states that she has intermittent rt sided abdominal pain with lack of appetite. She has reported this to her PCP.    Today, she denies symptoms of palpitations, chest pain, shortness of breath, orthopnea, PND, lower extremity edema, dizziness, presyncope, syncope, or neurologic sequela. The patient is tolerating medications without difficulties and is otherwise without complaint today.   Past Medical History:  Diagnosis Date  . Anxiety   . Depression   . Diabetes mellitus without complication ([place])    type 2  . GERD (gastroesophageal reflux disease)   . Headache    cluster headaches   . History of hiatal hernia   . Osteoarthritis of both knees   . Pneumonia   . Shortness of breath dyspnea    on exertion  . Sleep apnea    CPAP   Past Surgical History:  Procedure Laterality Date  . ANKLE ARTHROSCOPY     bone spurs  . CARDIOVERSION N/A 01/19/2020   Procedure: CARDIOVERSION;  Surgeon: Fay Records, MD;  Location: [facility];  Service: Cardiovascular;  Laterality: N/A;  . FOOT NEUROMA SURGERY    . HYSTEROSCOPY WITH D & C N/A 06/25/2015   Procedure: DILATATION AND CURETTAGE /HYSTEROSCOPY;  Surgeon: Brien Few, MD;  Location: [facility];  Service: Gynecology;  Laterality: N/A;  . KNEE ARTHROSCOPY     x 4   . TEAR DUCT PROBING Right   . TEE WITHOUT CARDIOVERSION N/A 01/19/2020   Procedure: TRANSESOPHAGEAL ECHOCARDIOGRAM (TEE);  Surgeon: Fay Records, MD;  Location: [facility];  Service: Cardiovascular;  Laterality: N/A;    Current Outpatient Medications  Medication Sig Dispense Refill  . acetaminophen (TYLENOL) 650 MG CR tablet Take 650 mg by mouth as needed for pain.     Marland Kitchen amiodarone (PACERONE) 200 MG tablet Take 1 tablet (200 mg total) by mouth 2 (two) times daily. 60 tablet 1  . apixaban (ELIQUIS) 5 MG TABS tablet Take 1 tablet (5 mg total) by mouth 2 (two) times daily. 60 tablet 0  . Ascorbic Acid (VITAMIN C) 1000 MG tablet Take 1,000 mg by mouth in the morning and at bedtime.     Marland Kitchen atorvastatin (LIPITOR) 10 MG tablet  Take 10 mg by mouth at bedtime.    . calcium carbonate (OS-CAL) 600 MG TABS tablet Take 600 mg by mouth 2 (two) times daily with a meal.    . carvedilol (COREG) 3.125 MG tablet Take 1 tablet (3.125 mg total) by mouth 2 (two) times daily with a meal. 60 tablet 3  . cholecalciferol (VITAMIN D) 1000 UNITS tablet Take 1,000 Units by mouth 2 (two) times daily.    . citalopram (CELEXA) 40 MG tablet Take 40 mg by mouth every evening.     . colchicine 0.6 MG tablet Take 0.5 tablets (0.3 mg total) by mouth daily. 90 tablet 3  . cyanocobalamin (,VITAMIN  B-12,) 1000 MCG/ML injection Inject 1,000 mcg into the muscle every 30 (thirty) days.    . ferrous sulfate (IRON SUPPLEMENT) 325 (65 FE) MG tablet Take 325 mg by mouth daily with breakfast.    . fluticasone (FLONASE) 50 MCG/ACT nasal spray Place 2 sprays into both nostrils as needed for allergies or rhinitis.     Marland Kitchen HYDROcodone-acetaminophen (NORCO) 10-325 MG tablet Take 1 tablet by mouth every 6 (six) hours as needed for pain.    Marland Kitchen insulin NPH-regular Human (NOVOLIN 70/30) (70-30) 100 UNIT/ML injection Inject 50 Units into the skin daily with supper.     . levocetirizine (XYZAL) 5 MG tablet Take 5 mg by mouth at bedtime.    Marland Kitchen losartan (COZAAR) 25 MG tablet Take 1 tablet (25 mg total) by mouth daily. 30 tablet 3  . metFORMIN (GLUCOPHAGE-XR) 500 MG 24 hr tablet Take 1,000 mg by mouth 2 (two) times daily after a meal.    . montelukast (SINGULAIR) 10 MG tablet Take 10 mg by mouth at bedtime.    . norethindrone (AYGESTIN) 5 MG tablet Take 20 mg by mouth daily.     . pantoprazole (PROTONIX) 40 MG tablet Take 40 mg by mouth daily.    . solifenacin (VESICARE) 10 MG tablet Take 10 mg by mouth daily.    Marland Kitchen topiramate (TOPAMAX) 100 MG tablet Take 150 mg by mouth every evening.     . vitamin B-12 (CYANOCOBALAMIN) 1000 MCG tablet Take 1,000 mcg by mouth daily.    . meloxicam (MOBIC) 15 MG tablet Take 15 mg by mouth every other day.      No current facility-administered medications for this encounter.    Allergies  Allergen Reactions  . Levaquin [Levofloxacin] Hives and Shortness Of Breath    Pt was on levaquin & vancomycin at same time & doesn't know which one caused the reaction  . Vancomycin Hives and Shortness Of Breath    Pt was levaquin & vancomycin at the same & doesn't know which one caused the reaction.    Geovanna Simko Bernard [Liraglutide] Nausea Only  . Tape Rash    Social History   Socioeconomic History  . Marital status: Single    Spouse name: Not on file  . Number of children: Not on file  .  Years of education: Not on file  . Highest education level: Not on file  Occupational History  . Not on file  Tobacco Use  . Smoking status: Never Smoker  . Smokeless tobacco: Never Used  Substance and Sexual Activity  . Alcohol use: No  . Drug use: No  . Sexual activity: Not on file  Other Topics Concern  . Not on file  Social History Narrative  . Not on file   Social Determinants of Health   Financial Resource Strain:   .  Difficulty of Paying Living Expenses:   Food Insecurity:   . Worried About Charity fundraiser in the Last Year:   . Arboriculturist in the Last Year:   Transportation Needs:   . Film/video editor (Medical):   Marland Kitchen Lack of Transportation (Non-Medical):   Physical Activity:   . Days of Exercise per Week:   . Minutes of Exercise per Session:   Stress:   . Feeling of Stress :   Social Connections:   . Frequency of Communication with Friends and Family:   . Frequency of Social Gatherings with Friends and Family:   . Attends Religious Services:   . Active Member of Clubs or Organizations:   . Attends Archivist Meetings:   Marland Kitchen Marital Status:   Intimate Partner Violence:   . Fear of Current or Ex-Partner:   . Emotionally Abused:   Marland Kitchen Physically Abused:   . Sexually Abused:     Family History  Problem Relation Age of Onset  . Pneumonia Mother   . Atrial fibrillation Mother   . Emphysema Father        smoked  . Lung cancer Father   . Rheum arthritis Sister     ROS- All systems are reviewed and negative except as per the HPI above  Physical Exam: Vitals:   01/30/20 1451  BP: 126/80  Pulse: 65  Weight: (!) 169.6 kg  Height: 5\' 6"  (1.676 m)   Wt Readings from Last 3 Encounters:  01/30/20 (!) 169.6 kg  01/17/20 (!) 175.2 kg  01/11/20 (!) 174.7 kg    Labs: Lab Results  Component Value Date   NA 138 01/20/2020   K 4.3 01/20/2020   CL 106 01/20/2020   CO2 21 (L) 01/20/2020   GLUCOSE 171 (H) 01/20/2020   BUN 16 01/20/2020    CREATININE 0.88 01/20/2020   CALCIUM 9.5 01/20/2020   MG 1.7 01/18/2020   No results found for: INR Lab Results  Component Value Date   CHOL 107 01/18/2020   HDL 20 (L) 01/18/2020   LDLCALC 65 01/18/2020   TRIG 112 01/18/2020     GEN- The patient is well appearing, alert and oriented x 3 today.   Head- normocephalic, atraumatic Eyes-  Sclera clear, conjunctiva pink Ears- hearing intact Oropharynx- clear Neck- supple, no JVP Lymph- no cervical lymphadenopathy Lungs- Clear to ausculation bilaterally, normal work of breathing Heart- Regular rate and rhythm, no murmurs, rubs or gallops, PMI not laterally displaced GI- soft, NT, ND, + BS Extremities- no clubbing, cyanosis, or edema MS- no significant deformity or atrophy Skin- no rash or lesion Psych- euthymic mood, full affect Neuro- strength and sensation are intact  EKG-NSR at 65 bpm, pr int 185 bpm, qrs int 78 ms qtc 413 ms   Echo-FINDINGS  Left Ventricle: Left ventricular ejection fraction, by estimation, is 55  to 60%. The left ventricle has normal function.   Right Ventricle: There is no diastilic collapse of the RV or RA. The right  ventricular size is not well visualized. Right vetricular wall thickness  was not assessed. Right ventricular systolic function was not well  visualized.   Pericardium: A large pericardial effusion is present. The pericardial  effusion is circumferential.   Venous: The IVCs is dilated but there is collapse with inspiration. The  inferior vena cava is dilated in size with greater than 50% respiratory  variability, suggesting right atrial pressure of 8 mmHg.   Additional Comments: Technically difficult echo due  to body habitus and  poor image quality. The LV function appears better than on previous echo.  The large pericardial effusion persists. There is no evidence of  pericardial tamponade.     Assessment and Plan: 1. New onset afib in the setting of a urological procedure  Pt  just started amiodarone 200 mg bid on Monday 4/19 and probably needs to let this load for one month Hopefully at her relatively young age, the use of amiodarone can be for a bridge to  other options for control of afib  in the future  General education re afib and triggers discussed She will have to wait a month after cardioversion before anticoagulation can be interrupted for her to proceed with her urological procedures    2. CHA2DS2VASc score of 3 Continue eliquis 5 mg bid  Bleeding precautions discussed   3. Pericardial effusion  Colchicine as prescribed Per Dr. Radford Pax   4. intermittent abdominal pain/anorexia States 50 lb weight loss unintentional since first of year Per PCP   F/u with [facility] 5/5 afib clinic as needed  Geroge Baseman. Holly Pring, [facility] [address] [place], [place] [address] [phone]

## 2020-02-08 ENCOUNTER — Ambulatory Visit: Payer: Medicare Other | Admitting: Physician Assistant

## 2020-02-12 ENCOUNTER — Other Ambulatory Visit (HOSPITAL_COMMUNITY): Payer: Medicare Other

## 2020-02-12 NOTE — Progress Notes (Signed)
Cardiology Office Note    Date:  02/14/2020   ID:  Holly [name], DOB April 15, 1966, MRN [id number]  PCP:  Shirline Frees, MD  Cardiologist: Fransico Him, MD EPS: None  No chief complaint on file.   History of Present Illness:  Holly [name] is a 54 y.o. female with history of morbid obesity, OSA on CPAP, anxiety depression, DM.  On 01/16/2020 during left ureteroscopy and stent placement she went into atrial fib with RVR.  This did not improve with IV diltiazem so she was started on IV amiodarone and underwent TEE/DCCV and converted to normal sinus rhythm.  2D echo showed large pericardial effusion and was placed on colchicine.  Patient was seen in the A. fib clinic 01/30/2020 who recommended waiting 1 month after cardioversion before she undergoes any further urological procedures.  Plan was for amiodarone to be a bridge to other options to control her A. fib in the future.  CHA2DS2-VASc equals 5 on Eliquis.  Colchicine dose lowered because of interaction with amiodarone.  Patient comes in for f/u. Walks with a cane. Denies chest pain, shortness of breath. Having a lot of pain since off of meloxicam while on colchicine. Having to take hyrdocodone/voltaren/tylenol. In a wheel chair most of the time. Echo cancelled on Monday because of scheduling problems. Complains of being extremely weak.     Past Medical History:  Diagnosis Date  . Anxiety   . Depression   . Diabetes mellitus without complication ([place])    type 2  . GERD (gastroesophageal reflux disease)   . Headache    cluster headaches  . History of hiatal hernia   . Osteoarthritis of both knees   . Pneumonia   . Shortness of breath dyspnea    on exertion  . Sleep apnea    CPAP    Past Surgical History:  Procedure Laterality Date  . ANKLE ARTHROSCOPY     bone spurs  . CARDIOVERSION N/A 01/19/2020   Procedure: CARDIOVERSION;  Surgeon: Fay Records, MD;  Location: [facility];  Service: Cardiovascular;  Laterality:  N/A;  . FOOT NEUROMA SURGERY    . HYSTEROSCOPY WITH D & C N/A 06/25/2015   Procedure: DILATATION AND CURETTAGE /HYSTEROSCOPY;  Surgeon: Brien Few, MD;  Location: [facility];  Service: Gynecology;  Laterality: N/A;  . KNEE ARTHROSCOPY     x 4   . TEAR DUCT PROBING Right   . TEE WITHOUT CARDIOVERSION N/A 01/19/2020   Procedure: TRANSESOPHAGEAL ECHOCARDIOGRAM (TEE);  Surgeon: Fay Records, MD;  Location: [facility] ENDOSCOPY;  Service: Cardiovascular;  Laterality: N/A;    Current Medications: Current Meds  Medication Sig  . acetaminophen (TYLENOL) 650 MG CR tablet Take 650 mg by mouth as needed for pain.   Marland Kitchen amiodarone (PACERONE) 200 MG tablet Starting on 5/19, take 1 tablet (200 mg) by mouth once a day  . apixaban (ELIQUIS) 5 MG TABS tablet Take 1 tablet (5 mg total) by mouth 2 (two) times daily.  . Ascorbic Acid (VITAMIN C) 1000 MG tablet Take 1,000 mg by mouth in the morning and at bedtime.   Marland Kitchen atorvastatin (LIPITOR) 10 MG tablet Take 10 mg by mouth at bedtime.  . calcium carbonate (OS-CAL) 600 MG TABS tablet Take 600 mg by mouth 2 (two) times daily with a meal.  . carvedilol (COREG) 3.125 MG tablet Take 1 tablet (3.125 mg total) by mouth 2 (two) times daily with a meal.  . cholecalciferol (VITAMIN D) 1000 UNITS tablet Take 1,000 Units  by mouth 2 (two) times daily.  . citalopram (CELEXA) 40 MG tablet Take 40 mg by mouth every evening.   . colchicine 0.6 MG tablet Take 0.5 tablets (0.3 mg total) by mouth daily.  . cyanocobalamin (,VITAMIN B-12,) 1000 MCG/ML injection Inject 1,000 mcg into the muscle every 30 (thirty) days.  . ferrous sulfate (IRON SUPPLEMENT) 325 (65 FE) MG tablet Take 325 mg by mouth daily with breakfast.  . fluticasone (FLONASE) 50 MCG/ACT nasal spray Place 2 sprays into both nostrils as needed for allergies or rhinitis.   Marland Kitchen HYDROcodone-acetaminophen (NORCO) 10-325 MG tablet Take 1 tablet by mouth every 6 (six) hours as needed for pain.  Marland Kitchen insulin NPH-regular Human (NOVOLIN  70/30) (70-30) 100 UNIT/ML injection Inject 50 Units into the skin daily with supper.   . levocetirizine (XYZAL) 5 MG tablet Take 5 mg by mouth at bedtime.  Marland Kitchen losartan (COZAAR) 25 MG tablet Take 1 tablet (25 mg total) by mouth daily.  . meloxicam (MOBIC) 15 MG tablet Take 15 mg by mouth every other day.   . metFORMIN (GLUCOPHAGE-XR) 500 MG 24 hr tablet Take 1,000 mg by mouth 2 (two) times daily after a meal.  . montelukast (SINGULAIR) 10 MG tablet Take 10 mg by mouth at bedtime.  . norethindrone (AYGESTIN) 5 MG tablet Take 20 mg by mouth daily.   . pantoprazole (PROTONIX) 40 MG tablet Take 40 mg by mouth daily.  . solifenacin (VESICARE) 10 MG tablet Take 10 mg by mouth daily.  Marland Kitchen topiramate (TOPAMAX) 100 MG tablet Take 150 mg by mouth every evening.   . vitamin B-12 (CYANOCOBALAMIN) 1000 MCG tablet Take 1,000 mcg by mouth daily.  . [DISCONTINUED] amiodarone (PACERONE) 200 MG tablet Take 1 tablet (200 mg total) by mouth 2 (two) times daily.     Allergies:   Levaquin [levofloxacin], Vancomycin, Victoza [liraglutide], and Tape   Social History   Socioeconomic History  . Marital status: Single    Spouse name: Not on file  . Number of children: Not on file  . Years of education: Not on file  . Highest education level: Not on file  Occupational History  . Not on file  Tobacco Use  . Smoking status: Never Smoker  . Smokeless tobacco: Never Used  Substance and Sexual Activity  . Alcohol use: No  . Drug use: No  . Sexual activity: Not on file  Other Topics Concern  . Not on file  Social History Narrative  . Not on file   Social Determinants of Health   Financial Resource Strain:   . Difficulty of Paying Living Expenses:   Food Insecurity:   . Worried About Charity fundraiser in the Last Year:   . Arboriculturist in the Last Year:   Transportation Needs:   . Film/video editor (Medical):   Marland Kitchen Lack of Transportation (Non-Medical):   Physical Activity:   . Days of Exercise per  Week:   . Minutes of Exercise per Session:   Stress:   . Feeling of Stress :   Social Connections:   . Frequency of Communication with Friends and Family:   . Frequency of Social Gatherings with Friends and Family:   . Attends Religious Services:   . Active Member of Clubs or Organizations:   . Attends Archivist Meetings:   Marland Kitchen Marital Status:      Family History:  The patient's family history includes Atrial fibrillation in her mother; Emphysema in her father; Lung  cancer in her father; Pneumonia in her mother; Rheum arthritis in her sister.   ROS:   Please see the history of present illness.    ROS All other systems reviewed and are negative.   PHYSICAL EXAM:   VS:  BP 136/79   Pulse 83   Ht 5\' 6"  (1.676 m)   Wt (!) 366 lb (166 kg)   SpO2 96%   BMI 59.07 kg/m   Physical Exam  GEN: Obese, in no acute distress  Neck: no JVD, carotid bruits, or masses Cardiac:RRR; no murmurs, rubs, or gallops  Respiratory:  clear to auscultation bilaterally, normal work of breathing GI: soft, nontender, nondistended, + BS [id number] edema without cyanosis, clubbing,Good distal pulses bilaterally Neuro:  Alert and Oriented x 3,  Psych: euthymic mood, full affect  Wt Readings from Last 3 Encounters:  02/14/20 (!) 366 lb (166 kg)  01/30/20 (!) 374 lb (169.6 kg)  01/17/20 (!) 386 lb 3.9 oz (175.2 kg)      Studies/Labs Reviewed:   EKG:  EKG is not ordered today.    Recent Labs: 01/16/2020: TSH 2.116 01/18/2020: Magnesium 1.7 01/20/2020: BUN 16; Creatinine, Ser 0.88; Hemoglobin 13.7; Platelets 242; Potassium 4.3; Sodium 138   Lipid Panel    Component Value Date/Time   CHOL 107 01/18/2020 0250   TRIG 112 01/18/2020 0250   HDL 20 (L) 01/18/2020 0250   CHOLHDL 5.4 01/18/2020 0250   VLDL 22 01/18/2020 0250   LDLCALC 65 01/18/2020 0250    Additional studies/ records that were reviewed today include:  Limited echo 01/20/2020 IMPRESSIONS     1. Technically difficult echo due  to body habitus and poor image quality.  The LV function appears better than on previous echo. The large  pericardial effusion persists. There is no evidence of pericardial  tamponade.   2. Left ventricular ejection fraction, by estimation, is 55 to 60%. The  left ventricle has normal function.   3. Right ventricular systolic function was not well visualized. The right  ventricular size is not well visualized.   4. Large pericardial effusion. The pericardial effusion is  circumferential.   5. The IVCs is dilated but there is collapse with inspiration . The  inferior vena cava is dilated in size with >50% respiratory variability,  suggesting right atrial pressure of 8 mmHg.   TEE 01/19/2020 IMPRESSIONS     1. Left ventricular ejection fraction, by estimation, is 35 to 40%. The  left ventricle has moderately decreased function. The left ventricle  demonstrates global hypokinesis.   2. Right ventricular systolic function is normal. The right ventricular  size is normal.   3. No left atrial/left atrial appendage thrombus was detected.   4. The mitral valve is normal in structure. Moderate mitral valve  regurgitation.   5. The aortic valve is normal in structure. Aortic valve regurgitation is  mild.    Limited TEE 01/17/2020 IMPRESSIONS     1. RV cavity appears small, no clear diastolic collapse. Right  ventricular systolic function is normal. The right ventricular size is  normal.   2. Promient apical fat pad is noted. moderate to large circumferential  pericardial effusion (measuring between 1.8-2.4 cm).   3. No clear significant respiratory inflow variation.   4. Borderline tricuspid inflow variation with respiration.   Comparison(s): Prior images reviewed side by side. 01/17/20: LVEF 35-40%,  large pericardial effusion, dilated IVC.   Conclusion(s)/Recommendation(s): Comparing both sets of images, there is a  moderate to large (>2.0 cm) circumferential  pericardial effusion, as  well  as a promient apical fat pad. The IVC is diltated and there is minimal  tricuspid, but no significant  mitral inflow variation with respiration. The RV appears normal diastolic  collapse is not noted. Tamponade physiology is difficult to ascertain in  the setting of cardiomyopathy, but not suspected. Vitals indicate  significant hypertension, which does not  suggest tamponade. Clinical correlation is always recommended.     ASSESSMENT:    1. Paroxysmal atrial fibrillation (HCC)   2. Pericardial effusion   3. NICM (nonischemic cardiomyopathy) ([place])   4. Morbid obesity ([place])   5. OSA on CPAP   6. Osteoarthritis, unspecified osteoarthritis type, unspecified site      PLAN:  In order of problems listed above:  Paroxysmal atrial fibrillation with RVR during urological procedure converted to normal sinus rhythm with TEE DCCV on amiodarone and Eliquis.  Plan is to use amiodarone as a bridge to other options to control A. fib in the future. Will decrease amiodarone to 200 mg once daily 02/28/20, continue eliquis. Dr. Radford Pax to decide after echo if she can stop amiodarone.  Pericardial effusion on colchicine lower dose because of interaction with amiodarone-patient had not started it initially after discharge but did start it.  Was to have a follow-up echo 1 month after discharge but has not been done.-scheduled for 02/29/20. F/u with Dr. Radford Pax after.  Cardiomyopathy EF 35 to 40% on initial echo and TEE like early tachycardia mediated.  Follow-up echo 01/20/2020 normal LVEF 50 to 60% but still large pericardial effusion.  Plan to repeat after 2 months-started on carvedilol and losartan  Morbid obesity recent unintentional 50 pound weight loss and abdominal pain being followed up by PCP  OSA on CPAP  DM  Severe osteoarthritis-having a lot of trouble off meloxicam while on colchicine.       Medication Adjustments/Labs and Tests Ordered: Current medicines are reviewed at length  with the patient today.  Concerns regarding medicines are outlined above.  Medication changes, Labs and Tests ordered today are listed in the Patient Instructions below. Patient Instructions  Medication Instructions:  Your physician has recommended you make the following change in your medication:   On 02/28/20, DECREASE: amiodarone to 1 tablet (200 mg) once a day  *If you need a refill on your cardiac medications before your next appointment, please call your pharmacy*   Lab Work: None ordered  If you have labs (blood work) drawn today and your tests are completely normal, you will receive your results only by: Marland Kitchen MyChart Message (if you have MyChart) OR . A paper copy in the mail If you have any lab test that is abnormal or we need to change your treatment, we will call you to review the results.   Testing/Procedures: Keep echocardiogram appointment on 02/29/20   Follow-Up: Follow up with Dr. Radford Pax on 03/05/20 at 10:20 AM  Other Instructions      Signed, Ermalinda Barrios, PA-C  02/14/2020 11:32 AM    [address] [place], [place], [place]  [address] Phone: [phone]; Fax: [phone]

## 2020-02-14 ENCOUNTER — Other Ambulatory Visit: Payer: Self-pay

## 2020-02-14 ENCOUNTER — Encounter: Payer: Self-pay | Admitting: Physician Assistant

## 2020-02-14 ENCOUNTER — Ambulatory Visit: Payer: Medicare Other | Admitting: Physician Assistant

## 2020-02-14 VITALS — BP 136/79 | HR 83 | Ht 66.0 in | Wt 366.0 lb

## 2020-02-14 DIAGNOSIS — I428 Other cardiomyopathies: Secondary | ICD-10-CM

## 2020-02-14 DIAGNOSIS — I480 Paroxysmal atrial fibrillation: Secondary | ICD-10-CM | POA: Diagnosis not present

## 2020-02-14 DIAGNOSIS — I3139 Other pericardial effusion (noninflammatory): Secondary | ICD-10-CM

## 2020-02-14 DIAGNOSIS — I313 Pericardial effusion (noninflammatory): Secondary | ICD-10-CM

## 2020-02-14 DIAGNOSIS — M1990 Unspecified osteoarthritis, unspecified site: Secondary | ICD-10-CM

## 2020-02-14 DIAGNOSIS — Z9989 Dependence on other enabling machines and devices: Secondary | ICD-10-CM

## 2020-02-14 DIAGNOSIS — E785 Hyperlipidemia, unspecified: Secondary | ICD-10-CM

## 2020-02-14 DIAGNOSIS — G4733 Obstructive sleep apnea (adult) (pediatric): Secondary | ICD-10-CM

## 2020-02-14 MED ORDER — AMIODARONE HCL 200 MG PO TABS: ORAL_TABLET | ORAL | 1 refills | Status: DC

## 2020-02-14 MED ORDER — APIXABAN 5 MG PO TABS: 5.0000 mg | ORAL_TABLET | Freq: Two times a day (BID) | ORAL | 3 refills | Status: DC

## 2020-02-14 NOTE — Patient Instructions (Signed)
Medication Instructions:  Your physician has recommended you make the following change in your medication:   On 02/28/20, DECREASE: amiodarone to 1 tablet (200 mg) once a day  *If you need a refill on your cardiac medications before your next appointment, please call your pharmacy*   Lab Work: None ordered  If you have labs (blood work) drawn today and your tests are completely normal, you will receive your results only by: Marland Kitchen MyChart Message (if you have MyChart) OR . A paper copy in the mail If you have any lab test that is abnormal or we need to change your treatment, we will call you to review the results.   Testing/Procedures: Keep echocardiogram appointment on 02/29/20   Follow-Up: Follow up with Dr. Radford Pax on 03/05/20 at 10:20 AM  Other Instructions

## 2020-02-14 NOTE — Addendum Note (Signed)
Addended by: Drue Novel I on: 02/14/2020 11:46 AM   Modules accepted: Orders

## 2020-02-29 ENCOUNTER — Ambulatory Visit (HOSPITAL_COMMUNITY): Payer: Medicare Other | Attending: Cardiovascular Disease

## 2020-02-29 ENCOUNTER — Other Ambulatory Visit: Payer: Self-pay

## 2020-02-29 DIAGNOSIS — I3139 Other pericardial effusion (noninflammatory): Secondary | ICD-10-CM

## 2020-02-29 DIAGNOSIS — I313 Pericardial effusion (noninflammatory): Secondary | ICD-10-CM | POA: Diagnosis present

## 2020-02-29 MED ORDER — PERFLUTREN LIPID MICROSPHERE
1.0000 mL | INTRAVENOUS | Status: AC | PRN
  Administered 2020-02-29: 1 mL via INTRAVENOUS

## 2020-03-04 ENCOUNTER — Telehealth: Payer: Self-pay

## 2020-03-04 DIAGNOSIS — I3139 Other pericardial effusion (noninflammatory): Secondary | ICD-10-CM

## 2020-03-04 NOTE — Telephone Encounter (Signed)
Spoke with the patient and cancelled her appointment with Dr. Radford Pax tomorrow. She will follow back up in July. The patient will follow up with the A Fib clinic this week.

## 2020-03-04 NOTE — Telephone Encounter (Signed)
-----   Message from Sueanne Margarita, MD sent at 03/04/2020  4:31 PM EDT ----- Cancel appt with me tomorrow and put it out 6 weeks.  I would like her seen back in afib clinic this week to come up with a plan on furthe antiarrhythmic therapy  Traci ----- Message ----- From: Antonieta Iba, RN Sent: 03/04/2020   1:46 PM EDT To: Sueanne Margarita, MD  Patient states that her last mammogram was about 2 years ago.  Does patient need to keep FU appt with you tomorrow?

## 2020-03-04 NOTE — Telephone Encounter (Signed)
-----   Message from Sueanne Margarita, MD sent at 03/03/2020  7:38 PM EDT ----- Please let patient know that heart function was normal with mildly thickened heart muscle, Trivial leakiness of MV and persistent fluid in pericardial sac surrounding the heart of unclear etiology.  Please get a chest CT to rule out malignancy.  Please find out when patient's last mammogram was.  Continue Colchicine and repeat echo in 4 weeks

## 2020-03-04 NOTE — Telephone Encounter (Signed)
The patient has been notified of the result and verbalized understanding.  All questions (if any) were answered. Antonieta Iba, RN 03/04/2020 1:51 PM  FU echocardiogram has been scheduled. Chest CT scan has been ordered.

## 2020-03-04 NOTE — Progress Notes (Unsigned)
Spoke with the patient and cancelled her appointment with Dr. Radford Pax tomorrow. She will follow back up in July. The patient will follow up with the A Fib clinic this week.

## 2020-03-04 NOTE — Addendum Note (Signed)
Addended by: Antonieta Iba on: 03/04/2020 05:03 PM   Modules accepted: Orders

## 2020-03-05 ENCOUNTER — Ambulatory Visit: Payer: Medicare Other | Admitting: Cardiology

## 2020-03-06 ENCOUNTER — Ambulatory Visit (HOSPITAL_COMMUNITY)
Admission: RE | Admit: 2020-03-06 | Discharge: 2020-03-06 | Disposition: A | Discharge status: Home / Self Care | Payer: Medicare Other | Source: Ambulatory Visit | Attending: Nurse Practitioner | Admitting: Nurse Practitioner

## 2020-03-06 ENCOUNTER — Other Ambulatory Visit: Payer: Self-pay

## 2020-03-06 ENCOUNTER — Encounter (HOSPITAL_COMMUNITY): Payer: Self-pay | Admitting: Nurse Practitioner

## 2020-03-06 VITALS — BP 118/78 | HR 75 | Ht 66.0 in | Wt 369.0 lb

## 2020-03-06 DIAGNOSIS — N200 Calculus of kidney: Secondary | ICD-10-CM | POA: Insufficient documentation

## 2020-03-06 DIAGNOSIS — Z794 Long term (current) use of insulin: Secondary | ICD-10-CM | POA: Insufficient documentation

## 2020-03-06 DIAGNOSIS — I480 Paroxysmal atrial fibrillation: Secondary | ICD-10-CM

## 2020-03-06 DIAGNOSIS — M170 Bilateral primary osteoarthritis of knee: Secondary | ICD-10-CM | POA: Diagnosis not present

## 2020-03-06 DIAGNOSIS — E119 Type 2 diabetes mellitus without complications: Secondary | ICD-10-CM | POA: Diagnosis not present

## 2020-03-06 DIAGNOSIS — D6869 Other thrombophilia: Secondary | ICD-10-CM

## 2020-03-06 DIAGNOSIS — Z7901 Long term (current) use of anticoagulants: Secondary | ICD-10-CM | POA: Insufficient documentation

## 2020-03-06 DIAGNOSIS — F329 Major depressive disorder, single episode, unspecified: Secondary | ICD-10-CM | POA: Diagnosis not present

## 2020-03-06 DIAGNOSIS — G4730 Sleep apnea, unspecified: Secondary | ICD-10-CM | POA: Diagnosis not present

## 2020-03-06 DIAGNOSIS — F5089 Other specified eating disorder: Secondary | ICD-10-CM | POA: Diagnosis not present

## 2020-03-06 DIAGNOSIS — I4891 Unspecified atrial fibrillation: Secondary | ICD-10-CM | POA: Insufficient documentation

## 2020-03-06 DIAGNOSIS — F419 Anxiety disorder, unspecified: Secondary | ICD-10-CM | POA: Insufficient documentation

## 2020-03-06 DIAGNOSIS — Z79899 Other long term (current) drug therapy: Secondary | ICD-10-CM | POA: Diagnosis not present

## 2020-03-06 DIAGNOSIS — Z791 Long term (current) use of non-steroidal anti-inflammatories (NSAID): Secondary | ICD-10-CM | POA: Diagnosis not present

## 2020-03-06 DIAGNOSIS — K219 Gastro-esophageal reflux disease without esophagitis: Secondary | ICD-10-CM | POA: Insufficient documentation

## 2020-03-06 DIAGNOSIS — I313 Pericardial effusion (noninflammatory): Secondary | ICD-10-CM | POA: Diagnosis not present

## 2020-03-06 DIAGNOSIS — R109 Unspecified abdominal pain: Secondary | ICD-10-CM | POA: Insufficient documentation

## 2020-03-07 NOTE — Progress Notes (Addendum)
Primary Care Physician: Shirline Frees, MD Referring Physician: [facility] f/u Cardiologist: Dr. Suzette Battiest is a 54 y.o. female with a h/o morbid obesity,anxiety, depression,  DM, Sleep apnea treated with cpap, and recent kidney stones that presented for an elective cystoscopy 4/6 with retrograde pyelogram with left ureteroscopy and stent placement. In PACU, she developed significant tachycardia with rates up to 160 bpm. EKG significant for atrial fibrillation with RVR. Cardiology consulted and started patient on a Cardizem drip and admitted to stepdown unit. No improvement with cardizem drip and was started ohn IV amiodarone and ultimately underwent TEE/DCCV which was successful to restore SR. Urological procedures to be done later on outpt basis. 2 D Echo showed large pericardial effusion and was placed on colchicine on d/c.  She was d/c 4/10.    I saw her about a week after hospitalization. She was loading on amio at 200 mg bid.  She is now 200 mg daily. She  is staying in [place]. She is scheduled for a cardiac CT for further w/u  large pericardial effusion by Dr. Radford Pax. She  is yet to have her urological issues with kidney stones addressed that prompted onset of afib. She sees intermittent hematuria.  She  denies alcohol use, no significant caffeine use. Uses CPAP. Has lost 50 lbs, unintentionally since the last of the year. This has stabilized. She stated that she has intermittent rt sided abdominal pain with lack of appetite. She has reported this to her PCP. She is very sedentary.   She is being seen today as Dr. Radford Pax wanted me to discuss alternatives for amiodarone.    Today, she denies symptoms of palpitations, chest pain, shortness of breath, orthopnea, PND, lower extremity edema, dizziness, presyncope, syncope, or neurologic sequela. The patient is tolerating medications without difficulties and is otherwise without complaint today.   Past Medical History:  Diagnosis Date  .  Anxiety   . Depression   . Diabetes mellitus without complication ([facility])    type 2  . GERD (gastroesophageal reflux disease)   . Headache    cluster headaches  . History of hiatal hernia   . Osteoarthritis of both knees   . Pneumonia   . Shortness of breath dyspnea    on exertion  . Sleep apnea    CPAP   Past Surgical History:  Procedure Laterality Date  . ANKLE ARTHROSCOPY     bone spurs  . CARDIOVERSION N/A 01/19/2020   Procedure: CARDIOVERSION;  Surgeon: Fay Records, MD;  Location: [facility];  Service: Cardiovascular;  Laterality: N/A;  . FOOT NEUROMA SURGERY    . HYSTEROSCOPY WITH D & C N/A 06/25/2015   Procedure: DILATATION AND CURETTAGE /HYSTEROSCOPY;  Surgeon: Brien Few, MD;  Location: [facility];  Service: Gynecology;  Laterality: N/A;  . KNEE ARTHROSCOPY     x 4   . TEAR DUCT PROBING Right   . TEE WITHOUT CARDIOVERSION N/A 01/19/2020   Procedure: TRANSESOPHAGEAL ECHOCARDIOGRAM (TEE);  Surgeon: Fay Records, MD;  Location: [facility] ENDOSCOPY;  Service: Cardiovascular;  Laterality: N/A;    Current Outpatient Medications  Medication Sig Dispense Refill  . acetaminophen (TYLENOL) 650 MG CR tablet Take 650 mg by mouth as needed for pain.     Marland Kitchen amiodarone (PACERONE) 200 MG tablet Starting on 5/19, take 1 tablet (200 mg) by mouth once a day 90 tablet 1  . apixaban (ELIQUIS) 5 MG TABS tablet Take 1 tablet (5 mg total) by mouth 2 (two)  times daily. 60 tablet 3  . Ascorbic Acid (VITAMIN C) 1000 MG tablet Take 1,000 mg by mouth in the morning and at bedtime.     Marland Kitchen atorvastatin (LIPITOR) 10 MG tablet Take 10 mg by mouth at bedtime.    . BD VEO INSULIN SYRINGE U/F 31G X 15/64" 1 ML MISC USE SYRINGE TO INJECT INSULIN 3 TIMES A DAY SUBCUTANEOUS FOR 30 DAYS    . calcium carbonate (OS-CAL) 600 MG TABS tablet Take 600 mg by mouth 2 (two) times daily with a meal.    . carvedilol (COREG) 3.125 MG tablet Take 1 tablet (3.125 mg total) by mouth 2 (two) times daily with a meal. 60 tablet 3  .  cholecalciferol (VITAMIN D) 1000 UNITS tablet Take 1,000 Units by mouth 2 (two) times daily.    . citalopram (CELEXA) 40 MG tablet Take 40 mg by mouth every evening.     . colchicine 0.6 MG tablet Take 0.5 tablets (0.3 mg total) by mouth daily. 90 tablet 3  . cyanocobalamin (,VITAMIN B-12,) 1000 MCG/ML injection Inject 1,000 mcg into the muscle every 30 (thirty) days.    . ferrous sulfate (IRON SUPPLEMENT) 325 (65 FE) MG tablet Take 325 mg by mouth daily with breakfast.    . fluticasone (FLONASE) 50 MCG/ACT nasal spray Place 2 sprays into both nostrils as needed for allergies or rhinitis.     Marland Kitchen HYDROcodone-acetaminophen (NORCO) 10-325 MG tablet Take 1 tablet by mouth every 6 (six) hours as needed for pain.    Marland Kitchen insulin NPH-regular Human (NOVOLIN 70/30) (70-30) 100 UNIT/ML injection Inject 50 Units into the skin daily with supper.     . Lancets (ONETOUCH DELICA PLUS 123XX123) MISC     . levocetirizine (XYZAL) 5 MG tablet Take 5 mg by mouth at bedtime.    Marland Kitchen losartan (COZAAR) 25 MG tablet Take 1 tablet (25 mg total) by mouth daily. 30 tablet 3  . metFORMIN (GLUCOPHAGE-XR) 500 MG 24 hr tablet Take 1,000 mg by mouth 2 (two) times daily after a meal.    . montelukast (SINGULAIR) 10 MG tablet Take 10 mg by mouth at bedtime.    . norethindrone (AYGESTIN) 5 MG tablet Take 20 mg by mouth daily.     Glory Rosebush VERIO test strip     . pantoprazole (PROTONIX) 40 MG tablet Take 40 mg by mouth daily.    . solifenacin (VESICARE) 10 MG tablet Take 10 mg by mouth daily.    Marland Kitchen topiramate (TOPAMAX) 100 MG tablet Take 150 mg by mouth every evening.     . vitamin B-12 (CYANOCOBALAMIN) 1000 MCG tablet Take 1,000 mcg by mouth daily.    . meloxicam (MOBIC) 15 MG tablet Take 15 mg by mouth every other day.      No current facility-administered medications for this encounter.    Allergies  Allergen Reactions  . Levaquin [Levofloxacin] Hives and Shortness Of Breath    Pt was on levaquin & vancomycin at same time &  doesn't know which one caused the reaction  . Vancomycin Hives and Shortness Of Breath    Pt was levaquin & vancomycin at the same & doesn't know which one caused the reaction.    [place]ack[place]ie Pilson Bernard [Liraglutide] Nausea Only  . Tape Rash    Social History   Socioeconomic History  . Marital status: Single    Spouse name: Not on file  . Number of children: Not on file  . Years of education: Not on file  .  Highest education level: Not on file  Occupational History  . Not on file  Tobacco Use  . Smoking status: Never Smoker  . Smokeless tobacco: Never Used  Substance and Sexual Activity  . Alcohol use: No  . Drug use: No  . Sexual activity: Not on file  Other Topics Concern  . Not on file  Social History Narrative  . Not on file   Social Determinants of Health   Financial Resource Strain:   . Difficulty of Paying Living Expenses:   Food Insecurity:   . Worried About Charity fundraiser in the Last Year:   . Arboriculturist in the Last Year:   Transportation Needs:   . Film/video editor (Medical):   Marland Kitchen Lack of Transportation (Non-Medical):   Physical Activity:   . Days of Exercise per Week:   . Minutes of Exercise per Session:   Stress:   . Feeling of Stress :   Social Connections:   . Frequency of Communication with Friends and Family:   . Frequency of Social Gatherings with Friends and Family:   . Attends Religious Services:   . Active Member of Clubs or Organizations:   . Attends Archivist Meetings:   Marland Kitchen Marital Status:   Intimate Partner Violence:   . Fear of Current or Ex-Partner:   . Emotionally Abused:   Marland Kitchen Physically Abused:   . Sexually Abused:     Family History  Problem Relation Age of Onset  . Pneumonia Mother   . Atrial fibrillation Mother   . Emphysema Father        smoked  . Lung cancer Father   . Rheum arthritis Sister     ROS- All systems are reviewed and negative except as per the HPI above  Physical Exam: Vitals:   03/06/20  1444  BP: 118/78  Pulse: 75  Weight: (!) 167.4 kg  Height: 5\' 6"  (1.676 m)   Wt Readings from Last 3 Encounters:  03/06/20 (!) 167.4 kg  02/14/20 (!) 166 kg  01/30/20 (!) 169.6 kg    Labs: Lab Results  Component Value Date   NA 138 01/20/2020   K 4.3 01/20/2020   CL 106 01/20/2020   CO2 21 (L) 01/20/2020   GLUCOSE 171 (H) 01/20/2020   BUN 16 01/20/2020   CREATININE 0.88 01/20/2020   CALCIUM 9.5 01/20/2020   MG 1.7 01/18/2020   No results found for: INR Lab Results  Component Value Date   CHOL 107 01/18/2020   HDL 20 (L) 01/18/2020   LDLCALC 65 01/18/2020   TRIG 112 01/18/2020     GEN- The patient is well appearing, alert and oriented x 3 today.   Head- normocephalic, atraumatic Eyes-  Sclera clear, conjunctiva pink Ears- hearing intact Oropharynx- clear Neck- supple, no JVP Lymph- no cervical lymphadenopathy Lungs- Clear to ausculation bilaterally, normal work of breathing Heart- Regular rate and rhythm, no murmurs, rubs or gallops, PMI not laterally displaced GI- soft, NT, ND, + BS Extremities- no clubbing, cyanosis, or edema MS- no significant deformity or atrophy Skin- no rash or lesion Psych- euthymic mood, full affect Neuro- strength and sensation are intact  EKG-NSR at 65 bpm, pr int 185 bpm, qrs int 78 ms qtc 413 ms   Echo-FINDINGS  Left Ventricle: Left ventricular ejection fraction, by estimation, is 55  to 60%. The left ventricle has normal function.   Right Ventricle: There is no diastilic collapse of the RV or RA. The right  ventricular size is not well visualized. Right vetricular wall thickness  was not assessed. Right ventricular systolic function was not well  visualized.   Pericardium: A large pericardial effusion is present. The pericardial  effusion is circumferential.   Venous: The IVCs is dilated but there is collapse with inspiration. The  inferior vena cava is dilated in size with greater than 50% respiratory  variability,  suggesting right atrial pressure of 8 mmHg.   Additional Comments: Technically difficult echo due to body habitus and  poor image quality. The LV function appears better than on previous echo.  The large pericardial effusion persists. There is no evidence of  pericardial tamponade.     Assessment and Plan: 1. New onset afib in the setting of a urological procedure 01/2020 Pt just started amiodarone 200 mg bid on Monday 4/19 and is now on 200 mg daily  She is staying in [place] She has yet to have her urological issues with kidney stones addressed, thought to have been trigger for afib  Pt is wanting to have this done in the near future It is premature for pt to be pulled off amiodarone as she just got loaded She is not an ablation candidate, due to her morbid obesity And stopping amio now may trigger afib with her pending procedure/surgery  TSH/cmet ordered  2. CHA2DS2VASc score of 3 Continue eliquis 5 mg bid  Bleeding precautions discussed   3. Pericardial effusion  Colchicine as prescribed  cardiac CT pending Per Dr. Radford Pax   4. intermittent abdominal pain/anorexia States 50 lb weight loss unintentional since first of year Per PCP  I will see back in 3 months and hopefully, she will have finished her urological procedures and can talk then about a 3 month washout from amio to consider use of [place] C. Greg Cratty, [facility] [address] [place], [place] [id number] [phone]

## 2020-03-08 ENCOUNTER — Other Ambulatory Visit: Payer: Self-pay

## 2020-03-08 ENCOUNTER — Other Ambulatory Visit: Payer: Medicare Other | Admitting: *Deleted

## 2020-03-08 DIAGNOSIS — I3139 Other pericardial effusion (noninflammatory): Secondary | ICD-10-CM

## 2020-03-08 DIAGNOSIS — I4891 Unspecified atrial fibrillation: Secondary | ICD-10-CM

## 2020-03-09 LAB — BASIC METABOLIC PANEL
BUN/Creatinine Ratio: 10 (ref 9–23)
BUN: 13 mg/dL (ref 6–24)
CO2: 20 mmol/L (ref 20–29)
Calcium: 9.5 mg/dL (ref 8.7–10.2)
Chloride: 105 mmol/L (ref 96–106)
Creatinine, Ser: 1.24 mg/dL — ABNORMAL HIGH (ref 0.57–1.00)
GFR calc Af Amer: 58 mL/min/{1.73_m2} — ABNORMAL LOW (ref >59)
GFR calc non Af Amer: 50 mL/min/{1.73_m2} — ABNORMAL LOW (ref >59)
Glucose: 96 mg/dL (ref 65–99)
Potassium: 4.2 mmol/L (ref 3.5–5.2)
Sodium: 141 mmol/L (ref 134–144)

## 2020-03-12 ENCOUNTER — Other Ambulatory Visit: Payer: Self-pay

## 2020-03-12 ENCOUNTER — Telehealth: Payer: Self-pay | Admitting: Cardiology

## 2020-03-12 ENCOUNTER — Ambulatory Visit (INDEPENDENT_AMBULATORY_CARE_PROVIDER_SITE_OTHER)
Admission: RE | Admit: 2020-03-12 | Discharge: 2020-03-12 | Disposition: A | Discharge status: Home / Self Care | Payer: Medicare Other | Source: Ambulatory Visit | Attending: Cardiology | Admitting: Cardiology

## 2020-03-12 DIAGNOSIS — I3139 Other pericardial effusion (noninflammatory): Secondary | ICD-10-CM

## 2020-03-12 DIAGNOSIS — I313 Pericardial effusion (noninflammatory): Secondary | ICD-10-CM

## 2020-03-12 MED ORDER — IOHEXOL 300 MG/ML  SOLN
80.0000 mL | Freq: Once | INTRAMUSCULAR | Status: AC | PRN
  Administered 2020-03-12: 80 mL via INTRAVENOUS

## 2020-03-12 NOTE — Telephone Encounter (Signed)
The patient has been notified of the result and verbalized understanding.  All questions (if any) were answered. Holly Iba, RN 03/12/2020 2:54 PM  Patient states that she is not taking Mobic right now.

## 2020-03-12 NOTE — Telephone Encounter (Signed)
   Pt is returning call from [facility] regarding lab results

## 2020-03-14 ENCOUNTER — Telehealth: Payer: Self-pay | Admitting: Cardiology

## 2020-03-14 NOTE — Telephone Encounter (Signed)
New message      [facility] Pre-operative Risk Assessment    HEARTCARE STAFF: - Please ensure there is not already an duplicate clearance open for this procedure. - Under Visit Info/Reason for Call, type in Other and utilize the format Clearance MM/DD/YY or Clearance TBD. Do not use dashes or single digits. - If request is for dental extraction, please clarify the # of teeth to be extracted.  Request for surgical clearance:  1. What type of surgery is being performed? Bilateral Ureteroscopy laser lithotripsy  2. When is this surgery scheduled? TBD   3. What type of clearance is required (medical clearance vs. Pharmacy clearance to hold med vs. Both)? medical  Are there any medications that need to be held prior to surgery and how long?n/a  4. Practice name and name of physician performing surgery? [facility], Dr. Jacalyn Lefevre   5. What is the office phone number? [phone]   7.   What is the office fax number? [phone]  8.   Anesthesia type (None, local, MAC, general) ? general   Maryjane Hurter 03/14/2020, 11:47 AM  _________________________________________________________________   (provider comments below)

## 2020-03-14 NOTE — Telephone Encounter (Signed)
Callback pool to verify with the surgeon's office to see if they want to hold Eliquis for the procedure. If so, please forward this to [facility] PREOP [facility] for review.

## 2020-03-15 NOTE — Telephone Encounter (Signed)
   Primary Cardiologist: Fransico Him, MD  Chart reviewed as part of pre-operative protocol coverage. Patient was contacted 03/15/2020 in reference to pre-operative risk assessment for pending surgery as outlined below.  Holly [name] was last seen on 03/06/2020 by Roderic Palau NP.  Since that day, Holly [name] has done well without chest pain or worsening dyspnea. She is disabled and cannot ambulate due to severe osteoarthritis, therefore unable to assess functional ability. She has no prior diagnosis of CAD. Recent EKG shows sinus rhythm.   Therefore, based on ACC/AHA guidelines, the patient would be at acceptable risk for the planned procedure without further cardiovascular testing.   I will route this recommendation to the requesting party via Epic fax function and remove from pre-op pool. Please call with questions.  We checked with the surgeon's office and was told she did not have to hold Eliquis for this procedure, if this changes, please let us know.   [place], [place] 03/15/2020, 3:13 PM

## 2020-03-15 NOTE — Telephone Encounter (Signed)
I left a message for requesting surgeon's office to return my call about cardiac clearance

## 2020-03-15 NOTE — Telephone Encounter (Signed)
Left voicemail for the patient to call back

## 2020-03-15 NOTE — Telephone Encounter (Signed)
Selita with [facility] is returning Natasha's call, to follow up in regards to clearance. She states no medications need to be held. She states if our pre-op team has additional questions a call may be returned to [phone] (ext: [phone]).

## 2020-03-18 ENCOUNTER — Other Ambulatory Visit: Payer: Self-pay | Admitting: Urology

## 2020-03-19 ENCOUNTER — Encounter (HOSPITAL_COMMUNITY): Payer: Self-pay

## 2020-03-19 NOTE — Patient Instructions (Addendum)
DUE TO COVID-19 ONLY ONE VISITOR ARE ALLOWED TO COME WITH YOU AND STAY IN THE WAITING ROOM ONLY DURING PRE OP AND PROCEDURE. THEN TWO VISITORS MAY VISIT WITH YOU IN YOUR PRIVATE ROOM DURING VISITING HOURS ONLY!!   COVID SWAB TESTING MUST BE COMPLETED ON: Friday, March 22, 2020 at  2:50PM [address], [facility] enter pre surgical testing line (Must self quarantine after testing. Follow instructions on handout.)             Your procedure is scheduled on: Tuesday, March 26, 2020   Report to [facility] Main  Entrance    Report to admitting at 8:15 AM   Call this number if you have problems the morning of surgery [phone]   Do not eat food or drink liquids :After Midnight.   Oral Hygiene is also important to reduce your risk of infection.                                    Remember - BRUSH YOUR TEETH THE MORNING OF SURGERY WITH YOUR REGULAR TOOTHPASTE   Do NOT smoke after Midnight   Take these medicines the morning of surgery with A SIP OF WATER: Amiodarone, Carvedilol, Montelukast Pantoprazole   The night before surgery only take 35 units of Novolin 70/30 if blood glucose is greater than 130  DO NOT TAKE ANY DIABETIC MEDICATIONS DAY OF YOUR SURGERY                               You may not have any metal on your body including hair pins, jewelry, and body piercings             Do not wear make-up, lotions, powders, perfumes/cologne, or deodorant             Do not wear nail polish.  Do not shave  48 hours prior to surgery.                Do not bring valuables to the hospital. [place].   Contacts, dentures or bridgework may not be worn into surgery.    Patients discharged the day of surgery will not be allowed to drive home.   Special Instructions: Bring a copy of your healthcare power of attorney and living will documents         the day of surgery if you haven't scanned them in  before.              Please read over the following fact sheets you were given: IF YOU HAVE QUESTIONS ABOUT YOUR PRE OP [place] [phone]  How to Manage Your Diabetes Before and After Surgery  Why is it important to control my blood sugar before and after surgery? . Improving blood sugar levels before and after surgery helps healing and can limit problems. . A way of improving blood sugar control is eating a healthy diet by: o  Eating less sugar and carbohydrates o  Increasing activity/exercise o  Talking with your doctor about reaching your blood sugar goals . High blood sugars (greater than 180 mg/dL) can raise your risk of infections and slow your recovery, so you will need to focus on controlling your  diabetes during the weeks before surgery. . Make sure that the doctor who takes care of your diabetes knows about your planned surgery including the date and location.  How do I manage my blood sugar before surgery? . Check your blood sugar at least 4 times a day, starting 2 days before surgery, to make sure that the level is not too high or low. o Check your blood sugar the morning of your surgery when you wake up and every 2 hours until you get to the Short Stay unit. . If your blood sugar is less than 70 mg/dL, you will need to treat for low blood sugar: o Do not take insulin. o Treat a low blood sugar (less than 70 mg/dL) with  cup of clear juice (cranberry or apple), 4 glucose tablets, OR glucose gel. o Recheck blood sugar in 15 minutes after treatment (to make sure it is greater than 70 mg/dL). If your blood sugar is not greater than 70 mg/dL on recheck, call [phone] for further instructions. . Report your blood sugar to the short stay nurse when you get to Short Stay.  . If you are admitted to the hospital after surgery: o Your blood sugar will be checked by the staff and you will probably be given insulin after surgery (instead of oral diabetes medicines)  to make sure you have good blood sugar levels. o The goal for blood sugar control after surgery is 80-180 mg/dL.   WHAT DO I DO ABOUT MY DIABETES MEDICATION?  Marland Kitchen Do not take diabetes medicines (pills) the morning of surgery.  Reviewed and Endorsed by [facility] Patient Education Committee, August 2015   [facility] - Preparing for Surgery Before surgery, you can play an important role.  Because skin is not sterile, your skin needs to be as free of germs as possible.  You can reduce the number of germs on your skin by washing with CHG (chlorahexidine gluconate) soap before surgery.  CHG is an antiseptic cleaner which kills germs and bonds with the skin to continue killing germs even after washing. Please DO NOT use if you have an allergy to CHG or antibacterial soaps.  If your skin becomes reddened/irritated stop using the CHG and inform your nurse when you arrive at Short Stay. Do not shave (including legs and underarms) for at least 48 hours prior to the first CHG shower.  You may shave your face/neck.  Please follow these instructions carefully:  1.  Shower with CHG Soap the night before surgery and the  morning of surgery.  2.  If you choose to wash your hair, wash your hair first as usual with your normal  shampoo.  3.  After you shampoo, rinse your hair and body thoroughly to remove the shampoo.                             4.  Use CHG as you would any other liquid soap.  You can apply chg directly to the skin and wash.  Gently with a scrungie or clean washcloth.  5.  Apply the CHG Soap to your body ONLY FROM THE NECK DOWN.   Do   not use on face/ open                           Wound or open sores. Avoid contact with eyes, ears mouth and   genitals (private parts).  Wash face,  Genitals (private parts) with your normal soap.             6.  Wash thoroughly, paying special attention to the area where your    surgery  will be performed.  7.  Thoroughly rinse your body  with warm water from the neck down.  8.  DO NOT shower/wash with your normal soap after using and rinsing off the CHG Soap.                9.  Pat yourself dry with a clean towel.            10.  Wear clean pajamas.            11.  Place clean sheets on your bed the night of your first shower and do not  sleep with pets. Day of Surgery : Do not apply any lotions/deodorants the morning of surgery.  Please wear clean clothes to the hospital/surgery center.  FAILURE TO FOLLOW THESE INSTRUCTIONS MAY RESULT IN THE CANCELLATION OF YOUR SURGERY  PATIENT SIGNATURE_________________________________  NURSE SIGNATURE__________________________________  ________________________________________________________________________

## 2020-03-20 ENCOUNTER — Encounter (HOSPITAL_COMMUNITY)
Admission: RE | Admit: 2020-03-20 | Discharge: 2020-03-20 | Disposition: A | Discharge status: Home / Self Care | Payer: Medicare Other | Source: Ambulatory Visit | Attending: Urology | Admitting: Urology

## 2020-03-20 ENCOUNTER — Other Ambulatory Visit: Payer: Self-pay

## 2020-03-20 ENCOUNTER — Encounter (HOSPITAL_COMMUNITY): Payer: Self-pay

## 2020-03-20 HISTORY — DX: Impingement syndrome of unspecified shoulder: M75.40

## 2020-03-20 HISTORY — DX: Polyp of corpus uteri: N84.0

## 2020-03-20 HISTORY — DX: Cluster headache syndrome, unspecified, not intractable: G44.009

## 2020-03-20 HISTORY — DX: Edema, unspecified: R60.9

## 2020-03-20 HISTORY — DX: Other specified joint disorders, unspecified shoulder: M25.819

## 2020-03-20 HISTORY — DX: Unspecified asthma, uncomplicated: J45.909

## 2020-03-20 HISTORY — DX: Carpal tunnel syndrome, bilateral upper limbs: G56.03

## 2020-03-20 HISTORY — DX: Localized edema: R60.0

## 2020-03-20 HISTORY — DX: Unspecified osteoarthritis, unspecified site: M19.90

## 2020-03-20 HISTORY — DX: Essential (primary) hypertension: I10

## 2020-03-20 HISTORY — DX: Personal history of urinary calculi: Z87.442

## 2020-03-20 HISTORY — DX: Other cervical disc displacement, unspecified cervical region: M50.20

## 2020-03-20 HISTORY — DX: Morbid (severe) obesity due to excess calories: E66.01

## 2020-03-20 NOTE — Progress Notes (Signed)
COVID Vaccine Completed: Yes Date COVID Vaccine completed: 01/26/2020 COVID vaccine manufacturer:  Moderna      PCP - Dr. Earney Mallet Cardiologist - Dr. Radford Pax D. Kayleen Memos NP last office visit 03/06/20 in epic cardic clearance from [facility] encounter 03/14/20 in epic  Chest x-ray - CT chest 03/12/20 in epic EKG - 03/06/20 in epic Stress Test - N/A ECHO - 02/29/20 in epic Cardiac Cath - N/A  Sleep Study - Yes CPAP - Yes  Fasting Blood Sugar - 100-120 Checks Blood Sugar __2___ times a day, as needed  Blood Thinner Instructions: Eliquis is continuing Aspirin Instructions: N/A Last Dose:N/A  Anesthesia review: New onser Afib 01/2020 BMI 57.95  Patient denies shortness of breath, fever, cough and chest pain at PAT appointment   Patient verbalized understanding of instructions that were given to them at the PAT appointment. Patient was also instructed that they will need to review over the PAT instructions again at home before surgery.

## 2020-03-22 ENCOUNTER — Other Ambulatory Visit (HOSPITAL_COMMUNITY)
Admission: RE | Admit: 2020-03-22 | Discharge: 2020-03-22 | Disposition: A | Discharge status: Home / Self Care | Payer: Medicare Other | Source: Ambulatory Visit | Attending: Plastic Surgery | Admitting: Plastic Surgery

## 2020-03-22 ENCOUNTER — Other Ambulatory Visit: Payer: Self-pay

## 2020-03-22 ENCOUNTER — Encounter (HOSPITAL_COMMUNITY)
Admission: RE | Admit: 2020-03-22 | Discharge: 2020-03-22 | Disposition: A | Discharge status: Home / Self Care | Payer: Medicare Other | Source: Ambulatory Visit | Attending: Urology | Admitting: Urology

## 2020-03-22 DIAGNOSIS — G4730 Sleep apnea, unspecified: Secondary | ICD-10-CM | POA: Insufficient documentation

## 2020-03-22 DIAGNOSIS — Z7901 Long term (current) use of anticoagulants: Secondary | ICD-10-CM | POA: Insufficient documentation

## 2020-03-22 DIAGNOSIS — Z01812 Encounter for preprocedural laboratory examination: Secondary | ICD-10-CM | POA: Insufficient documentation

## 2020-03-22 DIAGNOSIS — F329 Major depressive disorder, single episode, unspecified: Secondary | ICD-10-CM | POA: Insufficient documentation

## 2020-03-22 DIAGNOSIS — Z794 Long term (current) use of insulin: Secondary | ICD-10-CM | POA: Diagnosis not present

## 2020-03-22 DIAGNOSIS — K219 Gastro-esophageal reflux disease without esophagitis: Secondary | ICD-10-CM | POA: Insufficient documentation

## 2020-03-22 DIAGNOSIS — I10 Essential (primary) hypertension: Secondary | ICD-10-CM | POA: Diagnosis not present

## 2020-03-22 DIAGNOSIS — Z79899 Other long term (current) drug therapy: Secondary | ICD-10-CM | POA: Insufficient documentation

## 2020-03-22 DIAGNOSIS — Z20822 Contact with and (suspected) exposure to covid-19: Secondary | ICD-10-CM | POA: Insufficient documentation

## 2020-03-22 DIAGNOSIS — I480 Paroxysmal atrial fibrillation: Secondary | ICD-10-CM | POA: Insufficient documentation

## 2020-03-22 DIAGNOSIS — N209 Urinary calculus, unspecified: Secondary | ICD-10-CM | POA: Diagnosis not present

## 2020-03-22 DIAGNOSIS — M170 Bilateral primary osteoarthritis of knee: Secondary | ICD-10-CM | POA: Insufficient documentation

## 2020-03-22 DIAGNOSIS — F419 Anxiety disorder, unspecified: Secondary | ICD-10-CM | POA: Insufficient documentation

## 2020-03-22 DIAGNOSIS — E119 Type 2 diabetes mellitus without complications: Secondary | ICD-10-CM | POA: Diagnosis not present

## 2020-03-22 LAB — URINALYSIS, ROUTINE W REFLEX MICROSCOPIC
Bilirubin Urine: NEGATIVE
Glucose, UA: NEGATIVE mg/dL
Ketones, ur: NEGATIVE mg/dL
Nitrite: NEGATIVE
Protein, ur: 100 mg/dL — AB
RBC / HPF: >50 RBC/hpf — ABNORMAL HIGH (ref 0–5)
Specific Gravity, Urine: 1.013 (ref 1.005–1.030)
WBC, UA: >50 WBC/hpf — ABNORMAL HIGH (ref 0–5)
pH: 6 (ref 5.0–8.0)

## 2020-03-22 LAB — CBC
HCT: 45.8 % (ref 36.0–46.0)
Hemoglobin: 14.2 g/dL (ref 12.0–15.0)
MCH: 27.2 pg (ref 26.0–34.0)
MCHC: 31 g/dL (ref 30.0–36.0)
MCV: 87.6 fL (ref 80.0–100.0)
Platelets: 307 10*3/uL (ref 150–400)
RBC: 5.23 MIL/uL — ABNORMAL HIGH (ref 3.87–5.11)
RDW: 15.4 % (ref 11.5–15.5)
WBC: 10.1 10*3/uL (ref 4.0–10.5)
nRBC: 0 % (ref 0.0–0.2)

## 2020-03-22 LAB — BASIC METABOLIC PANEL
Anion gap: 11 (ref 5–15)
BUN: 14 mg/dL (ref 6–20)
CO2: 22 mmol/L (ref 22–32)
Calcium: 9.3 mg/dL (ref 8.9–10.3)
Chloride: 106 mmol/L (ref 98–111)
Creatinine, Ser: 1.15 mg/dL — ABNORMAL HIGH (ref 0.44–1.00)
GFR calc Af Amer: >60 mL/min (ref >60)
GFR calc non Af Amer: 55 mL/min — ABNORMAL LOW (ref >60)
Glucose, Bld: 184 mg/dL — ABNORMAL HIGH (ref 70–99)
Potassium: 4.2 mmol/L (ref 3.5–5.1)
Sodium: 139 mmol/L (ref 135–145)

## 2020-03-22 LAB — HEMOGLOBIN A1C
Hgb A1c MFr Bld: 7.4 % — ABNORMAL HIGH (ref 4.8–5.6)
Mean Plasma Glucose: 165.68 mg/dL

## 2020-03-22 LAB — SARS CORONAVIRUS 2 (TAT 6-24 HRS): SARS Coronavirus 2: NEGATIVE

## 2020-03-24 LAB — URINE CULTURE: Culture: >=100000 — AB

## 2020-03-25 NOTE — H&P (View-Only) (Signed)
CC/HPI: cc: urolithiasis   03/19/20: 54 year old woman with a 9 mm left nonobstructing renal calculus on CT he was scheduled for the operating room however developed AFib with RVR prior to induction of anesthesia. Her case was subsequently canceled and she was admitted to cardiology. A repeat CT scan also showed a 9 mm distal non-obstructing ureteral calculus on the right and 12 mm left non-obstructing. She now returns to schedule surgery.   12/22/19: 54 year old woman with several months of back pain found to have a 9 mm left nonobstructing renal calculus on the CT of the L-spine. Patient has significant osteoarthritis in her knees making it difficult for her to stand for more than 1 minutes at a time as well as osteoarthritis of her knees. She has difficulty lying flat and needs to sleep propped up on a pillow. She does not take any blood thinners. She has history of diabetes but no hypertension. She has never had a kidney stone before. Her hemoglobin A1c is 8.7. She rates her pain at a 9/10 however she is sitting comfortably in a wheelchair in the office. She says she can be sitting in her recliner without any pain and then after sitting at the breakfast table starts to have pain in her left back and side. No hematuria.     ALLERGIES: Dust Grass Levaquin Mold Surgical TAPE Vancomycin Victoza SOLN    MEDICATIONS: Metformin Hcl 500 mg tablet  Amiodarone Hcl  Atorvastatin Calcium  Calcium  Citalopram Hbr 40 mg tablet  Cyanocobalamin  Hydrocodone-Acetaminophen  Insulin  Iron  Levocetirizine Dihydrochloride 5 mg tablet  Meloxicam 15 mg tablet  Montelukast Sodium 10 mg tablet  Norethindrone  Pantoprazole Sodium 40 mg tablet, delayed release  Solifenacin Succinate 10 mg tablet  Topiramate  Vitamin B12  Vitamin C  Vitamin D3     GU PSH: None   NON-GU PSH: None   GU PMH: Renal calculus, I discussed with patient findings of the 9 mm left nonobstructing renal calculus. I explained my  hesitation that this is causing her severe back pain for several months as it is nonobstructing. We did talk about the possibility of it intermittently obstructing however her symptoms do sound more musculoskeletal. For management of stone we discussed observation versus surgery. I do not feel is patient will be a good ESWL candidate due to her inability to lie flat as well as large skin to stone distance. We discussed ureteroscopy with laser lithotripsy and retrograde pyelogram as well as stent placement and patient has elected to proceed. I did counsel the patient at this may not improve her back pain and she understands. We talked about the risks and benefits of the procedure including bleeding, infection, pain, stent discomfort, need for staged procedure, inability remove the stone, damage to surrounding structures and she has elected to proceed. - 12/22/2019    NON-GU PMH: Anxiety Diabetes Type 2    FAMILY HISTORY: None   SOCIAL HISTORY: Marital Status: Single Preferred Language: English; Ethnicity: Not Hispanic Or Latino; Race: White Current Smoking Status: Patient has never smoked.   Tobacco Use Assessment Completed: Used Tobacco in last 30 days? Has never drank.  Drinks 1 caffeinated drink per day.    REVIEW OF SYSTEMS:    GU Review Female:   Patient denies frequent urination, hard to postpone urination, burning /pain with urination, get up at night to urinate, leakage of urine, stream starts and stops, trouble starting your stream, have to strain to urinate, and being pregnant.  Gastrointestinal (  Upper):   Patient denies nausea, vomiting, and indigestion/ heartburn.  Gastrointestinal (Lower):   Patient denies diarrhea and constipation.  Constitutional:   Patient denies weight loss, fatigue, night sweats, and fever.  Skin:   Patient denies skin rash/ lesion and itching.  Eyes:   Patient denies blurred vision and double vision.  Ears/ Nose/ Throat:   Patient denies sore throat and  sinus problems.  Hematologic/Lymphatic:   Patient denies swollen glands and easy bruising.  Cardiovascular:   Patient denies leg swelling and chest pains.  Respiratory:   Patient denies cough and shortness of breath.  Endocrine:   Patient denies excessive thirst.  Musculoskeletal:   Patient denies back pain and joint pain.  Neurological:   Patient denies headaches and dizziness.  Psychologic:   Patient denies depression and anxiety.   VITAL SIGNS:      03/19/2020 01:16 PM  Weight 372 lb / 168.74 kg  Height 67 in / 170.18 cm  BP 146/71 mmHg  Pulse 72 /min  Temperature 97.7 F / 36.5 C  BMI 58.3 kg/m   MULTI-SYSTEM PHYSICAL EXAMINATION:    Constitutional: Well-nourished. No physical deformities. Normally developed. Good grooming.  Neck: Neck symmetrical, not swollen. Normal tracheal position.  Respiratory: No labored breathing, no use of accessory muscles.   Cardiovascular: Normal temperature  Skin: No paleness, no jaundice, no cyanosis. No lesion, no ulcer, no rash.  Neurologic / Psychiatric: Oriented to time, oriented to place, oriented to person. No depression, no anxiety, no agitation.  Gastrointestinal: No rigidity  Eyes: Normal conjunctivae. Normal eyelids.  Ears, Nose, Mouth, and Throat: Left ear no scars, no lesions, no masses. Right ear no scars, no lesions, no masses. Nose no scars, no lesions, no masses. Normal hearing. Normal lips.  Musculoskeletal: Normal gait and station of head and neck.     Complexity of Data:  Records Review:   POC Tool  Urine Test Review:   Urinalysis  X-Ray Review: C.T. Abdomen/Pelvis: Reviewed Films. Reviewed Report. Discussed With Patient.    Notes:                     03/08/2020: BUN 13, creatinine 1.24   PROCEDURES:          Urinalysis w/Scope Dipstick Dipstick Cont'd Micro  Color: Brown Bilirubin: Invalid mg/dL WBC/hpf: 6 - 10/hpf  Appearance: Turbid Ketones: Invalid mg/dL RBC/hpf: >60/hpf  Specific Gravity: Invalid Blood: Invalid  ery/uL Bacteria: Rare (0-9/hpf)  pH: Invalid Protein: Invalid mg/dL Cystals: NS (Not Seen)  Glucose: Invalid mg/dL Urobilinogen: Invalid mg/dL Casts: NS (Not Seen)    Nitrites: Invalid Trichomonas: Not Present    Leukocyte Esterase: Invalid leu/uL Mucous: Not Present      Epithelial Cells: 0 - 5/hpf      Yeast: NS (Not Seen)      Sperm: Not Present    Notes: Invalid results due to color interference. Microscopic not concentrated.    ASSESSMENT:      ICD-10 Details  1 GU:   Renal calculus - N20.0 Chronic, Stable - Patient's surgery is been rescheduled to next week. We will proceed with cystoscopy with bilateral ureteroscopy with laser lithotripsy and stent placement. Patient understands that this may need to be a staged procedure on the left. We discussed the risks and benefits of the procedure and she has elected to proceed. Urinalysis today does not appear consistent with infection. All questions were answered.  2   Ureteral calculus - N20.1 Chronic, Stable

## 2020-03-25 NOTE — H&P (Signed)
CC/HPI: cc: urolithiasis   03/19/20: 54 year old woman with a 9 mm left nonobstructing renal calculus on CT he was scheduled for the operating room however developed AFib with RVR prior to induction of anesthesia. Her case was subsequently canceled and she was admitted to cardiology. A repeat CT scan also showed a 9 mm distal non-obstructing ureteral calculus on the right and 12 mm left non-obstructing. She now returns to schedule surgery.   12/22/19: 54 year old woman with several months of back pain found to have a 9 mm left nonobstructing renal calculus on the CT of the L-spine. Patient has significant osteoarthritis in her knees making it difficult for her to stand for more than 1 minutes at a time as well as osteoarthritis of her knees. She has difficulty lying flat and needs to sleep propped up on a pillow. She does not take any blood thinners. She has history of diabetes but no hypertension. She has never had a kidney stone before. Her hemoglobin A1c is 8.7. She rates her pain at a 9/10 however she is sitting comfortably in a wheelchair in the office. She says she can be sitting in her recliner without any pain and then after sitting at the breakfast table starts to have pain in her left back and side. No hematuria.     ALLERGIES: Dust Grass Levaquin Mold Surgical TAPE Vancomycin Victoza SOLN    MEDICATIONS: Metformin Hcl 500 mg tablet  Amiodarone Hcl  Atorvastatin Calcium  Calcium  Citalopram Hbr 40 mg tablet  Cyanocobalamin  Hydrocodone-Acetaminophen  Insulin  Iron  Levocetirizine Dihydrochloride 5 mg tablet  Meloxicam 15 mg tablet  Montelukast Sodium 10 mg tablet  Norethindrone  Pantoprazole Sodium 40 mg tablet, delayed release  Solifenacin Succinate 10 mg tablet  Topiramate  Vitamin B12  Vitamin C  Vitamin D3     GU PSH: None   NON-GU PSH: None   GU PMH: Renal calculus, I discussed with patient findings of the 9 mm left nonobstructing renal calculus. I explained my  hesitation that this is causing her severe back pain for several months as it is nonobstructing. We did talk about the possibility of it intermittently obstructing however her symptoms do sound more musculoskeletal. For management of stone we discussed observation versus surgery. I do not feel is patient will be a good ESWL candidate due to her inability to lie flat as well as large skin to stone distance. We discussed ureteroscopy with laser lithotripsy and retrograde pyelogram as well as stent placement and patient has elected to proceed. I did counsel the patient at this may not improve her back pain and she understands. We talked about the risks and benefits of the procedure including bleeding, infection, pain, stent discomfort, need for staged procedure, inability remove the stone, damage to surrounding structures and she has elected to proceed. - 12/22/2019    NON-GU PMH: Anxiety Diabetes Type 2    FAMILY HISTORY: None   SOCIAL HISTORY: Marital Status: Single Preferred Language: English; Ethnicity: Not Hispanic Or Latino; Race: White Current Smoking Status: Patient has never smoked.   Tobacco Use Assessment Completed: Used Tobacco in last 30 days? Has never drank.  Drinks 1 caffeinated drink per day.    REVIEW OF SYSTEMS:    GU Review Female:   Patient denies frequent urination, hard to postpone urination, burning /pain with urination, get up at night to urinate, leakage of urine, stream starts and stops, trouble starting your stream, have to strain to urinate, and being pregnant.  Gastrointestinal (  Upper):   Patient denies nausea, vomiting, and indigestion/ heartburn.  Gastrointestinal (Lower):   Patient denies diarrhea and constipation.  Constitutional:   Patient denies weight loss, fatigue, night sweats, and fever.  Skin:   Patient denies skin rash/ lesion and itching.  Eyes:   Patient denies blurred vision and double vision.  Ears/ Nose/ Throat:   Patient denies sore throat and  sinus problems.  Hematologic/Lymphatic:   Patient denies swollen glands and easy bruising.  Cardiovascular:   Patient denies leg swelling and chest pains.  Respiratory:   Patient denies cough and shortness of breath.  Endocrine:   Patient denies excessive thirst.  Musculoskeletal:   Patient denies back pain and joint pain.  Neurological:   Patient denies headaches and dizziness.  Psychologic:   Patient denies depression and anxiety.   VITAL SIGNS:      03/19/2020 01:16 PM  Weight 372 lb / 168.74 kg  Height 67 in / 170.18 cm  BP 146/71 mmHg  Pulse 72 /min  Temperature 97.7 F / 36.5 C  BMI 58.3 kg/m   MULTI-SYSTEM PHYSICAL EXAMINATION:    Constitutional: Well-nourished. No physical deformities. Normally developed. Good grooming.  Neck: Neck symmetrical, not swollen. Normal tracheal position.  Respiratory: No labored breathing, no use of accessory muscles.   Cardiovascular: Normal temperature  Skin: No paleness, no jaundice, no cyanosis. No lesion, no ulcer, no rash.  Neurologic / Psychiatric: Oriented to time, oriented to place, oriented to person. No depression, no anxiety, no agitation.  Gastrointestinal: No rigidity  Eyes: Normal conjunctivae. Normal eyelids.  Ears, Nose, Mouth, and Throat: Left ear no scars, no lesions, no masses. Right ear no scars, no lesions, no masses. Nose no scars, no lesions, no masses. Normal hearing. Normal lips.  Musculoskeletal: Normal gait and station of head and neck.     Complexity of Data:  Records Review:   POC Tool  Urine Test Review:   Urinalysis  X-Ray Review: C.T. Abdomen/Pelvis: Reviewed Films. Reviewed Report. Discussed With Patient.    Notes:                     03/08/2020: BUN 13, creatinine 1.24   PROCEDURES:          Urinalysis w/Scope Dipstick Dipstick Cont'd Micro  Color: Brown Bilirubin: Invalid mg/dL WBC/hpf: 6 - 10/hpf  Appearance: Turbid Ketones: Invalid mg/dL RBC/hpf: >60/hpf  Specific Gravity: Invalid Blood: Invalid  ery/uL Bacteria: Rare (0-9/hpf)  pH: Invalid Protein: Invalid mg/dL Cystals: NS (Not Seen)  Glucose: Invalid mg/dL Urobilinogen: Invalid mg/dL Casts: NS (Not Seen)    Nitrites: Invalid Trichomonas: Not Present    Leukocyte Esterase: Invalid leu/uL Mucous: Not Present      Epithelial Cells: 0 - 5/hpf      Yeast: NS (Not Seen)      Sperm: Not Present    Notes: Invalid results due to color interference. Microscopic not concentrated.    ASSESSMENT:      ICD-10 Details  1 GU:   Renal calculus - N20.0 Chronic, Stable - Patient's surgery is been rescheduled to next week. We will proceed with cystoscopy with bilateral ureteroscopy with laser lithotripsy and stent placement. Patient understands that this may need to be a staged procedure on the left. We discussed the risks and benefits of the procedure and she has elected to proceed. Urinalysis today does not appear consistent with infection. All questions were answered.  2   Ureteral calculus - N20.1 Chronic, Stable

## 2020-03-25 NOTE — Progress Notes (Signed)
Anesthesia Chart Review   Case: [id number] Date/Time: 03/26/20 1000   Procedures:      CYSTOSCOPY WITH RETROGRADE PYELOGRAM, URETEROSCOPY AND STENT PLACEMENT (Bilateral ) - 8 MINS     HOLMIUM LASER APPLICATION (Bilateral )   Anesthesia type: General   Pre-op diagnosis: UROLITHIASIS   Location: [facility]OM 06 / [facility]   Surgeons: Robley Fries, MD      DISCUSSION:54 y.o. never smoker with h/o GERD, DM II, sleep apnea, atrial fibrillation (on Eliquis), HTN, asthma, cluster headaches, urolithiasis scheduled for above procedure 03/26/2020 with Dr. Jacalyn Lefevre.   Per cardiology pre-operative risk assessment, "Chart reviewed as part of pre-operative protocol coverage. Patient was contacted 03/15/2020 in reference to pre-operative risk assessment for pending surgery as outlined below.  Holly [name] was last seen on 03/06/2020 by Roderic Palau NP.  Since that day, Holly [name] has done well without chest pain or worsening dyspnea. She is disabled and cannot ambulate due to severe osteoarthritis, therefore unable to assess functional ability. She has no prior diagnosis of CAD. Recent EKG shows sinus rhythm.  Therefore, based on ACC/AHA guidelines, the patient would be at acceptable risk for the planned procedure without further cardiovascular testing.  I will route this recommendation to the requesting party via Epic fax function and remove from pre-op pool. Please call with questions. We checked with the surgeon's office and was told she did not have to hold Eliquis for this procedure, if this changes, please let us know."  Anticipate pt can proceed with planned procedure barring acute status change.   VS: BP (!) 147/66    Pulse 73    Temp 36.9 C (Oral)    Ht 5\' 7"  (1.702 m)    Wt (!) 171 kg    SpO2 97%    BMI 59.05 kg/m   PROVIDERS: Shirline Frees, MD is PCP   Fransico Him, MD is Cardiologist  LABS: forwarded to surgeon (all labs ordered are listed, but only abnormal results are  displayed)  Labs Reviewed  URINE CULTURE - Abnormal; Notable for the following components:      Result Value   Culture >=100,000 COLONIES/mL ENTEROCOCCUS FAECALIS (*)    Organism ID, Bacteria ENTEROCOCCUS FAECALIS (*)    All other components within normal limits  HEMOGLOBIN A1C - Abnormal; Notable for the following components:   Hgb A1c MFr Bld 7.4 (*)    All other components within normal limits  URINALYSIS, ROUTINE W REFLEX MICROSCOPIC - Abnormal; Notable for the following components:   APPearance CLOUDY (*)    Hgb urine dipstick LARGE (*)    Protein, ur 100 (*)    Leukocytes,Ua LARGE (*)    RBC / HPF >50 (*)    WBC, UA >50 (*)    Bacteria, UA FEW (*)    All other components within normal limits  BASIC METABOLIC PANEL - Abnormal; Notable for the following components:   Glucose, Bld 184 (*)    Creatinine, Ser 1.15 (*)    GFR calc non Af Amer 55 (*)    All other components within normal limits  CBC - Abnormal; Notable for the following components:   RBC 5.23 (*)    All other components within normal limits     IMAGES:   EKG: 03/06/2020 Rate 75 bpm Normal sinus rhythm Low voltage QRS Septal infarct , age undetermined Abnormal ECG No significant change since last tracing  CV: Echo 02/29/2020 IMPRESSIONS    1. Left ventricular ejection fraction, by  estimation, is 60 to 65%. The  left ventricle has normal function. The left ventricle has no regional  wall motion abnormalities. There is mild concentric left ventricular  hypertrophy. Left ventricular diastolic  parameters are consistent with Grade I diastolic dysfunction (impaired  relaxation).  2. Right ventricular systolic function is normal. The right ventricular  size is normal.  3. Moderate pericardial effusion. The pericardial effusion is posterior  to the left ventricle. There is no evidence of cardiac tamponade.  4. The mitral valve is normal in structure. Trivial mitral valve  regurgitation. No evidence  of mitral stenosis.  5. The aortic valve is tricuspid. Aortic valve regurgitation is not  visualized. No aortic stenosis is present.  6. The inferior vena cava is normal in size with <50% respiratory  variability, suggesting right atrial pressure of 8 mmHg.  Past Medical History:  Diagnosis Date   Anxiety    Arthritis    lower back   Asthma    was told she cough variant asthma   Atrial fibrillation ([place]) 01/2020   Cancer ([place]) 2016   Carpal tunnel syndrome, bilateral    Cluster headache    Depression    Diabetes mellitus without complication (HCC)    type 2   Endometrial polyp    GERD (gastroesophageal reflux disease)    Herniated disc, cervical    History of hiatal hernia    History of kidney stones    Hypertension    Morbid obesity (HCC)    Osteoarthritis of both knees    Peripheral edema    Pneumonia    Shortness of breath dyspnea    on exertion   Shoulder impingement    Bilateral   Sleep apnea    CPAP    Past Surgical History:  Procedure Laterality Date   ANKLE ARTHROSCOPY Right    bone spurs   CARDIOVERSION N/A 01/19/2020   Procedure: CARDIOVERSION;  Surgeon: Fay Records, MD;  Location: [facility];  Service: Cardiovascular;  Laterality: N/A;   FOOT NEUROMA SURGERY Left    HYSTEROSCOPY WITH D & C N/A 06/25/2015   Procedure: DILATATION AND CURETTAGE /HYSTEROSCOPY;  Surgeon: Brien Few, MD;  Location: [facility];  Service: Gynecology;  Laterality: N/A;   IRRIGATION AND DEBRIDEMENT KNEE Left    for staph infection x2   KNEE ARTHROSCOPY Left    TEAR DUCT PROBING Right    TEE WITHOUT CARDIOVERSION N/A 01/19/2020   Procedure: TRANSESOPHAGEAL ECHOCARDIOGRAM (TEE);  Surgeon: Fay Records, MD;  Location[facility] END[facility]COPY;  Service: Cardiovascular;  Laterality: N/A;    MEDICATIONS:  acetaminophen (TYLENOL) 650 MG CR tablet   amiodarone (PACERONE) 200 MG tablet   apixaban (ELIQUIS) 5 MG TABS tablet   Ascorbic Acid (VITAMIN C) 1000 MG  tablet   atorvastatin (LIPITOR) 10 MG tablet   BD VEO INSULIN SYRINGE U/F 31G X 15/64" 1 ML MISC   calcium carbonate (OS-CAL) 600 MG TABS tablet   carvedilol (COREG) 3.125 MG tablet   Cholecalciferol (VITAMIN D) 50 MCG (2000 UT) tablet   citalopram (CELEXA) 40 MG tablet   colchicine 0.6 MG tablet   cyanocobalamin (,VITAMIN B-12,) 1000 MCG/ML injection   ferrous sulfate (IRON SUPPLEMENT) 325 (65 FE) MG tablet   fluticasone (FLONASE) 50 MCG/ACT nasal spray   HYDROcodone-acetaminophen (NORCO) 10-325 MG tablet   hydrocortisone cream 1 %   insulin NPH-regular Human (NOVOLIN 70/30) (70-30) 100 UNIT/ML injection   Lancets (ONETOUCH DELICA PLUS UXNATF57D) MISC   levocetirizine (XYZAL) 5 MG tablet  losartan (COZAAR) 25 MG tablet   metFORMIN (GLUCOPHAGE-XR) 500 MG 24 hr tablet   montelukast (SINGULAIR) 10 MG tablet   norethindrone (AYGESTIN) 5 MG tablet   ONETOUCH VERIO test strip   pantoprazole (PROTONIX) 40 MG tablet   Propylene Glycol-Glycerin (SOOTHE) 0.6-0.6 % SOLN   solifenacin (VESICARE) 10 MG tablet   topiramate (TOPAMAX) 100 MG tablet   vitamin B-12 (CYANOCOBALAMIN) 1000 MCG tablet   No current facility-administered medications for this encounter.    Maia Plan [facility] [phone] 03/25/20  10:49 AM

## 2020-03-25 NOTE — Progress Notes (Signed)
UA and culture faxed to Dr. Claudia Desanctis via epic.

## 2020-03-25 NOTE — Anesthesia Preprocedure Evaluation (Addendum)
Anesthesia Evaluation  Patient identified by MRN, date of birth, ID band Patient awake    Reviewed: Allergy & Precautions, NPO status , Patient's Chart, lab work & pertinent test results, reviewed documented beta blocker date and time   History of Anesthesia Complications Negative for: history of anesthetic complications  Airway Mallampati: III  TM Distance: >3 FB Neck ROM: Full    Dental no notable dental hx.    Pulmonary asthma , sleep apnea and Continuous Positive Airway Pressure Ventilation ,    Pulmonary exam normal        Cardiovascular hypertension, Pt. on medications and Pt. on home beta blockers Normal cardiovascular exam+ dysrhythmias (on Eliquis and amiodarone) Atrial Fibrillation   Echo 02/29/2020: EF 60-65%, mild LVH, grade 1 DD, moderate pericardial effusion posterior to the left ventricle without evidence of cardiac tamponade     Neuro/Psych Anxiety Depression negative neurological ROS     GI/Hepatic Neg liver ROS, hiatal hernia, GERD  Medicated and Controlled,  Endo/Other  diabetes, Type 2, Oral Hypoglycemic Agents, Insulin DependentMorbid obesity  Renal/GU negative Renal ROS  negative genitourinary   Musculoskeletal  (+) Arthritis ,   Abdominal   Peds  Hematology negative hematology ROS (+)   Anesthesia Other Findings Day of surgery medications reviewed with patient.  Reproductive/Obstetrics negative OB ROS                            Anesthesia Physical Anesthesia Plan  ASA: III  Anesthesia Plan: General   Post-op Pain Management:    Induction: Intravenous  PONV Risk Score and Plan: 3 and Midazolam, Ondansetron, Dexamethasone and Treatment may vary due to age or medical condition  Airway Management Planned: Oral ETT  Additional Equipment: None  Intra-op Plan:   Post-operative Plan: Extubation in OR  Informed Consent: I have reviewed the patients History and  Physical, chart, labs and discussed the procedure including the risks, benefits and alternatives for the proposed anesthesia with the patient or authorized representative who has indicated his/her understanding and acceptance.     Dental advisory given  Plan Discussed with: CRNA  Anesthesia Plan Comments:        Anesthesia Quick Evaluation

## 2020-03-26 ENCOUNTER — Encounter (HOSPITAL_COMMUNITY)
Admission: RE | Disposition: A | Discharge status: Home / Self Care | Payer: Self-pay | Source: Ambulatory Visit | Attending: Urology

## 2020-03-26 ENCOUNTER — Ambulatory Visit (HOSPITAL_COMMUNITY): Payer: Medicare Other | Admitting: Physician Assistant

## 2020-03-26 ENCOUNTER — Ambulatory Visit (HOSPITAL_COMMUNITY): Payer: Medicare Other | Admitting: Anesthesiology

## 2020-03-26 ENCOUNTER — Ambulatory Visit (HOSPITAL_COMMUNITY): Payer: Medicare Other

## 2020-03-26 ENCOUNTER — Other Ambulatory Visit: Payer: Self-pay

## 2020-03-26 ENCOUNTER — Encounter (HOSPITAL_COMMUNITY): Payer: Self-pay | Admitting: Urology

## 2020-03-26 ENCOUNTER — Ambulatory Visit (HOSPITAL_COMMUNITY)
Admission: RE | Admit: 2020-03-26 | Discharge: 2020-03-26 | Disposition: A | Discharge status: Home / Self Care | Payer: Medicare Other | Source: Ambulatory Visit | Attending: Urology | Admitting: Urology

## 2020-03-26 DIAGNOSIS — Z791 Long term (current) use of non-steroidal anti-inflammatories (NSAID): Secondary | ICD-10-CM | POA: Insufficient documentation

## 2020-03-26 DIAGNOSIS — Z794 Long term (current) use of insulin: Secondary | ICD-10-CM | POA: Diagnosis not present

## 2020-03-26 DIAGNOSIS — F419 Anxiety disorder, unspecified: Secondary | ICD-10-CM | POA: Insufficient documentation

## 2020-03-26 DIAGNOSIS — G4730 Sleep apnea, unspecified: Secondary | ICD-10-CM | POA: Insufficient documentation

## 2020-03-26 DIAGNOSIS — Z6841 Body Mass Index (BMI) 40.0 and over, adult: Secondary | ICD-10-CM | POA: Insufficient documentation

## 2020-03-26 DIAGNOSIS — Z79899 Other long term (current) drug therapy: Secondary | ICD-10-CM | POA: Insufficient documentation

## 2020-03-26 DIAGNOSIS — N202 Calculus of kidney with calculus of ureter: Secondary | ICD-10-CM | POA: Diagnosis present

## 2020-03-26 DIAGNOSIS — I10 Essential (primary) hypertension: Secondary | ICD-10-CM | POA: Diagnosis not present

## 2020-03-26 DIAGNOSIS — J45909 Unspecified asthma, uncomplicated: Secondary | ICD-10-CM | POA: Diagnosis not present

## 2020-03-26 DIAGNOSIS — M170 Bilateral primary osteoarthritis of knee: Secondary | ICD-10-CM | POA: Insufficient documentation

## 2020-03-26 DIAGNOSIS — E119 Type 2 diabetes mellitus without complications: Secondary | ICD-10-CM | POA: Diagnosis not present

## 2020-03-26 HISTORY — PX: CYSTOSCOPY WITH RETROGRADE PYELOGRAM, URETEROSCOPY AND STENT PLACEMENT: SHX5789

## 2020-03-26 HISTORY — PX: HOLMIUM LASER APPLICATION: SHX5852

## 2020-03-26 LAB — GLUCOSE, CAPILLARY
Glucose-Capillary: 142 mg/dL — ABNORMAL HIGH (ref 70–99)
Glucose-Capillary: 169 mg/dL — ABNORMAL HIGH (ref 70–99)

## 2020-03-26 LAB — PREGNANCY, URINE: Preg Test, Ur: NEGATIVE

## 2020-03-26 SURGERY — CYSTOURETEROSCOPY, WITH RETROGRADE PYELOGRAM AND STENT INSERTION
Anesthesia: General | Laterality: Bilateral

## 2020-03-26 MED ORDER — OXYCODONE HCL 5 MG PO TABS
ORAL_TABLET | ORAL | Status: AC
  Filled 2020-03-26: qty 1

## 2020-03-26 MED ORDER — EPHEDRINE 5 MG/ML INJ
INTRAVENOUS | Status: AC
  Filled 2020-03-26: qty 10

## 2020-03-26 MED ORDER — FENTANYL CITRATE (PF) 100 MCG/2ML IJ SOLN
INTRAMUSCULAR | Status: AC
  Filled 2020-03-26: qty 2

## 2020-03-26 MED ORDER — FENTANYL CITRATE (PF) 100 MCG/2ML IJ SOLN
INTRAMUSCULAR | Status: DC | PRN
  Administered 2020-03-26 (×2): 50 ug via INTRAVENOUS

## 2020-03-26 MED ORDER — PROMETHAZINE HCL 25 MG/ML IJ SOLN: 6.2500 mg | INTRAMUSCULAR | Status: DC | PRN

## 2020-03-26 MED ORDER — MIDAZOLAM HCL 5 MG/5ML IJ SOLN
INTRAMUSCULAR | Status: DC | PRN
  Administered 2020-03-26: 2 mg via INTRAVENOUS

## 2020-03-26 MED ORDER — DEXAMETHASONE SODIUM PHOSPHATE 10 MG/ML IJ SOLN
INTRAMUSCULAR | Status: AC
  Filled 2020-03-26: qty 1

## 2020-03-26 MED ORDER — LIDOCAINE 2% (20 MG/ML) 5 ML SYRINGE
INTRAMUSCULAR | Status: DC | PRN
  Administered 2020-03-26: 100 mg via INTRAVENOUS

## 2020-03-26 MED ORDER — IOHEXOL 300 MG/ML  SOLN
INTRAMUSCULAR | Status: DC | PRN
  Administered 2020-03-26: 27 mL

## 2020-03-26 MED ORDER — MIDAZOLAM HCL 2 MG/2ML IJ SOLN
INTRAMUSCULAR | Status: AC
  Filled 2020-03-26: qty 2

## 2020-03-26 MED ORDER — PHENYLEPHRINE 40 MCG/ML (10ML) SYRINGE FOR IV PUSH (FOR BLOOD PRESSURE SUPPORT)
PREFILLED_SYRINGE | INTRAVENOUS | Status: AC
  Filled 2020-03-26: qty 10

## 2020-03-26 MED ORDER — OXYBUTYNIN CHLORIDE 5 MG PO TABS: 5.0000 mg | ORAL_TABLET | Freq: Three times a day (TID) | ORAL | 0 refills | Status: DC | PRN

## 2020-03-26 MED ORDER — PHENAZOPYRIDINE HCL 100 MG PO TABS
100.0000 mg | ORAL_TABLET | Freq: Three times a day (TID) | ORAL | 0 refills | Status: DC | PRN
Start: 2020-03-26 — End: 2020-04-16

## 2020-03-26 MED ORDER — SUCCINYLCHOLINE CHLORIDE 200 MG/10ML IV SOSY
PREFILLED_SYRINGE | INTRAVENOUS | Status: AC
  Filled 2020-03-26: qty 10

## 2020-03-26 MED ORDER — OXYCODONE HCL 5 MG PO TABS
5.0000 mg | ORAL_TABLET | Freq: Once | ORAL | Status: AC | PRN
  Administered 2020-03-26: 5 mg via ORAL

## 2020-03-26 MED ORDER — ROCURONIUM BROMIDE 10 MG/ML (PF) SYRINGE
PREFILLED_SYRINGE | INTRAVENOUS | Status: DC | PRN
  Administered 2020-03-26: 60 mg via INTRAVENOUS
  Administered 2020-03-26: 10 mg via INTRAVENOUS

## 2020-03-26 MED ORDER — ORAL CARE MOUTH RINSE: 15.0000 mL | Freq: Once | OROMUCOSAL | Status: AC

## 2020-03-26 MED ORDER — LIDOCAINE 2% (20 MG/ML) 5 ML SYRINGE
INTRAMUSCULAR | Status: AC
  Filled 2020-03-26: qty 5

## 2020-03-26 MED ORDER — ACETAMINOPHEN 500 MG PO TABS
1000.0000 mg | ORAL_TABLET | Freq: Once | ORAL | Status: AC
  Administered 2020-03-26: 1000 mg via ORAL

## 2020-03-26 MED ORDER — ROCURONIUM BROMIDE 10 MG/ML (PF) SYRINGE
PREFILLED_SYRINGE | INTRAVENOUS | Status: AC
  Filled 2020-03-26: qty 10

## 2020-03-26 MED ORDER — FENTANYL CITRATE (PF) 100 MCG/2ML IJ SOLN: 25.0000 ug | INTRAMUSCULAR | Status: DC | PRN

## 2020-03-26 MED ORDER — SUGAMMADEX SODIUM 500 MG/5ML IV SOLN
INTRAVENOUS | Status: AC
  Filled 2020-03-26: qty 5

## 2020-03-26 MED ORDER — PROPOFOL 10 MG/ML IV BOLUS
INTRAVENOUS | Status: DC | PRN
  Administered 2020-03-26: 200 mg via INTRAVENOUS

## 2020-03-26 MED ORDER — SODIUM CHLORIDE 0.9 % IR SOLN
Status: DC | PRN
  Administered 2020-03-26 (×2): 3000 mL

## 2020-03-26 MED ORDER — CHLORHEXIDINE GLUCONATE 0.12 % MT SOLN
15.0000 mL | Freq: Once | OROMUCOSAL | Status: AC
  Administered 2020-03-26: 15 mL via OROMUCOSAL

## 2020-03-26 MED ORDER — SODIUM CHLORIDE 0.9 % IV SOLN
2.0000 g | INTRAVENOUS | Status: AC
  Administered 2020-03-26: 2 g via INTRAVENOUS
  Filled 2020-03-26: qty 20

## 2020-03-26 MED ORDER — ONDANSETRON HCL 4 MG/2ML IJ SOLN
INTRAMUSCULAR | Status: DC | PRN
  Administered 2020-03-26: 4 mg via INTRAVENOUS

## 2020-03-26 MED ORDER — SUGAMMADEX SODIUM 200 MG/2ML IV SOLN
INTRAVENOUS | Status: DC | PRN
  Administered 2020-03-26: 400 mg via INTRAVENOUS

## 2020-03-26 MED ORDER — LACTATED RINGERS IV SOLN: INTRAVENOUS | Status: DC

## 2020-03-26 MED ORDER — OXYCODONE HCL 5 MG/5ML PO SOLN: 5.0000 mg | Freq: Once | ORAL | Status: AC | PRN

## 2020-03-26 MED ORDER — ONDANSETRON HCL 4 MG/2ML IJ SOLN
INTRAMUSCULAR | Status: AC
  Filled 2020-03-26: qty 2

## 2020-03-26 MED ORDER — DEXAMETHASONE SODIUM PHOSPHATE 10 MG/ML IJ SOLN
INTRAMUSCULAR | Status: DC | PRN
  Administered 2020-03-26: 10 mg via INTRAVENOUS

## 2020-03-26 SURGICAL SUPPLY — 22 items
BAG URO CATCHER STRL LF (MISCELLANEOUS) ×3 IMPLANT
BASKET ZERO TIP NITINOL 2.4FR (BASKET) ×2 IMPLANT
BSKT STON RTRVL ZERO TP 2.4FR (BASKET) ×1
CATH URET 5FR 28IN OPEN ENDED (CATHETERS) ×3 IMPLANT
CLOTH BEACON ORANGE TIMEOUT ST (SAFETY) ×3 IMPLANT
DRSG TEGADERM 2-3/8X2-3/4 SM (GAUZE/BANDAGES/DRESSINGS) ×3 IMPLANT
EXTRACTOR STONE 1.7FRX115CM (UROLOGICAL SUPPLIES) IMPLANT
FIBER LASER FLEXIVA 365 (UROLOGICAL SUPPLIES) ×3 IMPLANT
FIBER LASER TRAC TIP (UROLOGICAL SUPPLIES) ×3 IMPLANT
GLOVE BIO SURGEON STRL SZ 6.5 (GLOVE) ×2 IMPLANT
GLOVE BIO SURGEONS STRL SZ 6.5 (GLOVE) ×1
GOWN STRL REUS W/TWL LRG LVL3 (GOWN DISPOSABLE) ×3 IMPLANT
GUIDEWIRE STR DUAL SENSOR (WIRE) ×5 IMPLANT
KIT TURNOVER KIT A (KITS) IMPLANT
MANIFOLD NEPTUNE II (INSTRUMENTS) ×3 IMPLANT
SHEATH URETERAL 12FRX28CM (UROLOGICAL SUPPLIES) ×2 IMPLANT
SHEATH URETERAL 12FRX35CM (MISCELLANEOUS) IMPLANT
STENT URET 6FRX26 CONTOUR (STENTS) ×4 IMPLANT
TRAY CYSTO PACK (CUSTOM PROCEDURE TRAY) ×3 IMPLANT
TUBING CONNECTING 10 (TUBING) ×2 IMPLANT
TUBING CONNECTING 10' (TUBING) ×1
TUBING UROLOGY SET (TUBING) ×3 IMPLANT

## 2020-03-26 NOTE — Transfer of Care (Signed)
Immediate Anesthesia Transfer of Care Note  Patient: Holly [name]  Procedure(s) Performed: CYSTOSCOPY WITH RETROGRADE PYELOGRAM, URETEROSCOPY AND STENT PLACEMENT (Bilateral ) HOLMIUM LASER APPLICATION (Bilateral )  Patient Location: PACU  Anesthesia Type:General  Level of Consciousness: awake, oriented, drowsy, patient cooperative and responds to stimulation  Airway & Oxygen Therapy: Patient Spontanous Breathing and Patient connected to face mask oxygen  Post-op Assessment: Report given to RN and Post -op Vital signs reviewed and stable  Post vital signs: Reviewed and stable  Last Vitals:  Vitals Value Taken Time  BP 129/53 03/26/20 1205  Temp 36.8 C 03/26/20 1205  Pulse 73 03/26/20 1209  Resp 24 03/26/20 1209  SpO2 100 % 03/26/20 1209  Vitals shown include unvalidated device data.  Last Pain:  Vitals:   03/26/20 0925  TempSrc: Oral  PainSc:       Patients Stated Pain Goal: 4 (94/[id number]/61 [id number])  Complications: No complications documented.

## 2020-03-26 NOTE — Interval H&P Note (Signed)
History and Physical Interval Note: Patient began nitrofurantoin yesterday.  She is asymptomatic and UA from office did not look infected.  Urine culture from preop did show bacteria.  No dysuria.  Okay to proceed.   03/26/2020 9:21 AM  Holly [name]  has presented today for surgery, with the diagnosis of UROLITHIASIS.  The various methods of treatment have been discussed with the patient and family. After consideration of risks, benefits and other options for treatment, the patient has consented to  Procedure(s) with comments: CYSTOSCOPY WITH RETROGRADE PYELOGRAM, URETEROSCOPY AND STENT PLACEMENT (Bilateral) - 81 MINS HOLMIUM LASER APPLICATION (Bilateral) as a surgical intervention.  The patient's history has been reviewed, patient examined, no change in status, stable for surgery.  I have reviewed the patient's chart and labs.  Questions were answered to the patient's satisfaction.     Holly [name] D Lurae Hornbrook

## 2020-03-26 NOTE — Anesthesia Postprocedure Evaluation (Signed)
Anesthesia Post Note  Patient: TZIPPY TESTERMAN  Procedure(s) Performed: CYSTOSCOPY WITH RETROGRADE PYELOGRAM, URETEROSCOPY AND STENT PLACEMENT (Bilateral ) HOLMIUM LASER APPLICATION (Bilateral )     Patient location during evaluation: PACU Anesthesia Type: General Level of consciousness: awake and alert and oriented Pain management: pain level controlled Vital Signs Assessment: post-procedure vital signs reviewed and stable Respiratory status: spontaneous breathing, nonlabored ventilation and respiratory function stable Cardiovascular status: blood pressure returned to baseline Postop Assessment: no apparent nausea or vomiting Anesthetic complications: no   No complications documented.  Last Vitals:  Vitals:   03/26/20 1300 03/26/20 1301  BP: (!) 147/87   Pulse: 79 78  Resp: (!) 23 (!) 22  Temp:    SpO2: 91% 96%    Last Pain:  Vitals:   03/26/20 1230  TempSrc:   PainSc: 0-No pain                 Brennan Bailey

## 2020-03-26 NOTE — Discharge Instructions (Signed)
DISCHARGE INSTRUCTIONS FOR KIDNEY STONE/URETERAL STENT   MEDICATIONS:  1. Resume all your other meds from home - except do not take any extra narcotic pain meds that you may have at home.  2. Pyridium is to help with the burning/stinging when you urinate. 3. Please complete your antibiotics (macrobid that was prescribed earlier this week) 4. Oxybutynin is to help with bladder cramping/pressure  ACTIVITY:  1. No strenuous activity x 1week  2. No driving while on narcotic pain medications  3. Drink plenty of water  4. Continue to walk at home - you can still get blood clots when you are at home, so keep active, but don't over do it.  5. May return to work/school tomorrow or when you feel ready   BATHING:  1. You can shower and we recommend daily showers  2. You have a string coming from your urethra: The stent string is attached to your ureteral stent. Do not pull on this.   SIGNS/SYMPTOMS TO CALL:  Please call us if you have a fever greater than 101.5, uncontrolled nausea/vomiting, uncontrolled pain, dizziness, unable to urinate, bloody urine, chest pain, shortness of breath, leg swelling, leg pain, redness around wound, drainage from wound, or any other concerns or questions.   You can reach [place] at [phone].   FOLLOW-UP:  1. You have a string attached to your stent, you may remove it on Friday, June 18. To do this, pull the strings until the stent is completely removed. You may feel an odd sensation in your back.  2. You will be scheduled for a second surgery to remove the stone on left side.

## 2020-03-26 NOTE — Anesthesia Procedure Notes (Signed)
Procedure Name: Intubation Date/Time: 03/26/2020 10:45 AM Performed by: Silas Sacramento, CRNA Pre-anesthesia Checklist: Patient identified, Emergency Drugs available, Suction available and Patient being monitored Patient Re-evaluated:Patient Re-evaluated prior to induction Oxygen Delivery Method: Circle system utilized Preoxygenation: Pre-oxygenation with 100% oxygen Induction Type: IV induction Ventilation: Mask ventilation without difficulty Laryngoscope Size: Glidescope and 4 Grade View: Grade I Tube type: Oral Tube size: 7.0 mm Number of attempts: 1 Airway Equipment and Method: Video-laryngoscopy and Rigid stylet Placement Confirmation: ETT inserted through vocal cords under direct vision,  positive ETCO2 and breath sounds checked- equal and bilateral Secured at: 22 cm Tube secured with: Tape Dental Injury: Teeth and Oropharynx as per pre-operative assessment and Bloody posterior oropharynx

## 2020-03-26 NOTE — Op Note (Signed)
Preoperative diagnosis: right ureteral calculus, left renal calculus  Postoperative diagnosis: right ureteral calculus, left renal calculus  Procedure:  1. Cystoscopy 2. right ureteroscopy, laser lithotripsy and stone removal 3. Left ureteroscopy 4. bilateral 75F x 26 ureteral stent placement (right side with string, left side no string) 5. bilateral retrograde pyelography with interpretation  Surgeon: Jacalyn Lefevre, MD  Anesthesia: General  Complications: None  Intraoperative findings: bilateral retrograde pyelography demonstrated a filling defect within the bilateral ureter consistent with the patient's known calculus without other abnormalities.  EBL: Minimal  Specimens: 1. right ureteral calculus  Disposition of specimens: [facility] for stone analysis  Indication: Holly [name] is a 54 y.o.   patient with a  right ureteral stone and associated right symptoms as well as left renal calculus. After reviewing the management options for treatment, the patient elected to proceed with the above surgical procedure(s). We have discussed the potential benefits and risks of the procedure, side effects of the proposed treatment, the likelihood of the patient achieving the goals of the procedure, and any potential problems that might occur during the procedure or recuperation. Informed consent has been obtained.   Description of procedure:  The patient was taken to the operating room and general anesthesia was induced.  The patient was placed in the dorsal lithotomy position, prepped and draped in the usual sterile fashion, and preoperative antibiotics were administered. A preoperative time-out was performed.   Cystourethroscopy was performed.  The patient's urethra was examined and was normal. The bladder was then systematically examined in its entirety. There was no evidence for any bladder tumors, stones, or other mucosal pathology.    Attention then turned to the  right ureteral orifice and a ureteral catheter was used to intubate the ureteral orifice.  Omnipaque contrast was injected through the ureteral catheter and a retrograde pyelogram was performed with findings as dictated above.  A 0.38 sensor guidewire was then advanced up the right ureter into the renal pelvis under fluoroscopic guidance. The 4.5Fr semirigid ureteroscope was then advanced into the ureter next to the guidewire and the calculus was identified.  The stone was then fragmented with the 200 micron laser fiber and all stones were then removed from the ureter with an 0 tip basket.  Reinspection of the ureter revealed no remaining visible stones or fragments.   The wire was then backloaded through the cystoscope and a ureteral stent was advance over the wire using Seldinger technique.  The stent was positioned appropriately under fluoroscopic and cystoscopic guidance.  The wire was then removed with an adequate stent curl noted in the renal pelvis as well as in the bladder.  Attention then turned to the left side.  Ureteral catheter was then used to intubate the left ureteral orifice and a retrograde pyelogram was obtained with findings noted above.  A wire was then advanced through the ureteral catheter up to the kidney with fluoroscopic guidance and the catheter was removed.  A second wire was placed alongside the first wire.  A ureteral access sheath was then advanced over the working wire and the inner sheath and wire removed.  Flexible ureteroscopy then took place however the proximal ureter was too narrow to accommodate the scope.  The ureteral access sheath was removed and unison with the ureteroscope.  The decision was made to leave a stent without a string so that this could be a staged procedure.  Stent was placed in standard fashion over a wire with cystoscopic and fluoroscopic guidance.  The bladder was then emptied and the procedure ended.  The patient appeared to tolerate the procedure  well and without complications.  The patient was able to be awakened and transferred to the recovery unit in satisfactory condition.   Disposition: The tether of the stent was left on the right and tucked inside the patient's vagina.  Instructions for removing the stent have been provided to the patient.  The patient will need to be scheduled for a second left ureteroscopy.

## 2020-03-27 ENCOUNTER — Encounter (HOSPITAL_COMMUNITY): Payer: Self-pay | Admitting: Urology

## 2020-03-27 ENCOUNTER — Other Ambulatory Visit: Payer: Self-pay | Admitting: Urology

## 2020-04-01 ENCOUNTER — Other Ambulatory Visit (HOSPITAL_COMMUNITY): Payer: Medicare Other

## 2020-04-05 NOTE — Patient Instructions (Signed)
DUE TO COVID-19 ONLY ONE VISITOR IS ALLOWED TO COME WITH YOU AND STAY IN THE WAITING ROOM ONLY DURING PRE OP AND PROCEDURE DAY OF SURGERY. THE 1 VISITOR MAY VISIT WITH YOU AFTER SURGERY IN YOUR PRIVATE ROOM DURING VISITING HOURS ONLY!  YOU NEED TO HAVE A COVID 19 TEST ON 04-12-20 @_______ , THIS TEST MUST BE DONE BEFORE SURGERY, COME  Holly [name] , [id number].  ([place]) ONCE YOUR COVID TEST IS COMPLETED, PLEASE BEGIN THE QUARANTINE INSTRUCTIONS AS OUTLINED IN YOUR HANDOUT.                Holly [name]  04/05/2020   Your procedure is scheduled on: 04-16-20   Report to [facility] Main  Entrance    Report to Admitting at 6:30 AM     Call this number if you have problems the morning of surgery [phone]    Remember: Do not eat food or drink liquids :After Midnight.     Take these medicines the morning of surgery with A SIP OF WATER: Amiodarone (Pacerone), Carvedilol (Coreg), Colchicine, Montelukast (Singulair), and Pantoprazole (Protonix). You may also use your nasal spray as needed.   BRUSH YOUR TEETH MORNING OF SURGERY AND RINSE YOUR MOUTH OUT, NO CHEWING GUM CANDY OR MINTS.  DO NOT TAKE ANY DIABETIC MEDICATIONS DAY OF YOUR SURGERY                               You may not have any metal on your body including hair pins and              piercings     Do not wear jewelry, make-up, lotions, powders or perfumes, deodorant              Do not wear nail polish on your fingernails.  Do not shave  48 hours prior to surgery.             Do not bring valuables to the hospital. Holly [name].  Contacts, dentures or bridgework may not be worn into surgery.       Patients discharged the day of surgery will not be allowed to drive home. IF YOU ARE HAVING SURGERY AND GOING HOME THE SAME DAY, YOU MUST HAVE AN ADULT TO DRIVE YOU HOME AND BE WITH YOU FOR 24 HOURS. YOU MAY GO HOME BY TAXI OR UBER OR  ORTHERWISE, BUT AN ADULT MUST ACCOMPANY YOU HOME AND STAY WITH YOU FOR 24 HOURS.  Name and phone number of your driver:  Special Instructions: N/A              Please read over the following fact sheets you were given: _____________________________________________________________________             How to Manage Your Diabetes Before and After Surgery  Why is it important to control my blood sugar before and after surgery? . Improving blood sugar levels before and after surgery helps healing and can limit problems. . A way of improving blood sugar control is eating a healthy diet by: o  Eating less sugar and carbohydrates o  Increasing activity/exercise o  Talking with your doctor about reaching your blood sugar goals . High blood sugars (greater than 180 mg/dL) can raise your risk of infections and slow your recovery, so  you will need to focus on controlling your diabetes during the weeks before surgery. . Make sure that the doctor who takes care of your diabetes knows about your planned surgery including the date and location.  How do I manage my blood sugar before surgery? . Check your blood sugar at least 4 times a day, starting 2 days before surgery, to make sure that the level is not too high or low. o Check your blood sugar the morning of your surgery when you wake up and every 2 hours until you get to the Short Stay unit. . If your blood sugar is less than 70 mg/dL, you will need to treat for low blood sugar: o Do not take insulin. o Treat a low blood sugar (less than 70 mg/dL) with  cup of clear juice (cranberry or apple), 4 glucose tablets, OR glucose gel. o Recheck blood sugar in 15 minutes after treatment (to make sure it is greater than 70 mg/dL). If your blood sugar is not greater than 70 mg/dL on recheck, call [phone] for further instructions. . Report your blood sugar to the short stay nurse when you get to Short Stay.  . If you are admitted to the hospital after  surgery: o Your blood sugar will be checked by the staff and you will probably be given insulin after surgery (instead of oral diabetes medicines) to make sure you have good blood sugar levels. o The goal for blood sugar control after surgery is 80-180 mg/dL.   WHAT DO I DO ABOUT MY DIABETES MEDICATION?  Marland Kitchen Do not take oral diabetes medicines (pills) the morning of surgery.  . THE DAY BEFORE SURGERY, take your usual scheduled Metformin and 50 U units of Novolin 70/30 insulin in the morning. However, only 35 U of Novolin 70/30 at lunch and supper time.        Reviewed and Endorsed by [facility] Patient Education Committee, August 2015   [facility] - Preparing for Surgery Before surgery, you can play an important role.  Because skin is not sterile, your skin needs to be as free of germs as possible.  You can reduce the number of germs on your skin by washing with CHG (chlorahexidine gluconate) soap before surgery.  CHG is an antiseptic cleaner which kills germs and bonds with the skin to continue killing germs even after washing. Please DO NOT use if you have an allergy to CHG or antibacterial soaps.  If your skin becomes reddened/irritated stop using the CHG and inform your nurse when you arrive at Short Stay. Do not shave (including legs and underarms) for at least 48 hours prior to the first CHG shower.  You may shave your face/neck. Please follow these instructions carefully:  1.  Shower with CHG Soap the night before surgery and the  morning of Surgery.  2.  If you choose to wash your hair, wash your hair first as usual with your  normal  shampoo.  3.  After you shampoo, rinse your hair and body thoroughly to remove the  shampoo.                           4.  Use CHG as you would any other liquid soap.  You can apply chg directly  to the skin and wash                       Gently with a scrungie  or clean washcloth.  5.  Apply the CHG Soap to your body ONLY FROM THE NECK DOWN.   Do not use  on face/ open                           Wound or open sores. Avoid contact with eyes, ears mouth and genitals (private parts).                       Wash face,  Genitals (private parts) with your normal soap.             6.  Wash thoroughly, paying special attention to the area where your surgery  will be performed.  7.  Thoroughly rinse your body with warm water from the neck down.  8.  DO NOT shower/wash with your normal soap after using and rinsing off  the CHG Soap.                9.  Pat yourself dry with a clean towel.            10.  Wear clean pajamas.            11.  Place clean sheets on your bed the night of your first shower and do not  sleep with pets. Day of Surgery : Do not apply any lotions/deodorants the morning of surgery.  Please wear clean clothes to the hospital/surgery center.  FAILURE TO FOLLOW THESE INSTRUCTIONS MAY RESULT IN THE CANCELLATION OF YOUR SURGERY PATIENT SIGNATURE_________________________________  NURSE SIGNATURE__________________________________  ________________________________________________________________________

## 2020-04-05 NOTE — Progress Notes (Signed)
COVID Vaccine Completed: Date COVID Vaccine completed: COVID vaccine manufacturer: Lind   PCP - Shirline Frees, MD Cardiologist - Fransico Him, MD w/ clearance date 03-14-20 in telephone encounter  Chest x-ray - 03-12-20 CT Chest w/contrast EKG - 03-06-20 Stress Test -  ECHO - 02-29-20 to repeat in 4 weeks Cardiac Cath -   Sleep Study -  CPAP -   HGA1C on 03-22-20 Fasting Blood Sugar -  Checks Blood Sugar _____ times a day  Blood Thinner Instructions: Eliquis Aspirin Instructions: Last Dose:  Anesthesia review:   Patient denies shortness of breath, fever, cough and chest pain at PAT appointment   Patient verbalized understanding of instructions that were given to them at the PAT appointment. Patient was also instructed that they will need to review over the PAT instructions again at home before surgery.

## 2020-04-08 ENCOUNTER — Encounter (HOSPITAL_COMMUNITY)
Admission: RE | Admit: 2020-04-08 | Discharge: 2020-04-08 | Disposition: A | Discharge status: Home / Self Care | Payer: Medicare Other | Source: Ambulatory Visit | Attending: Urology | Admitting: Urology

## 2020-04-10 ENCOUNTER — Other Ambulatory Visit: Payer: Self-pay

## 2020-04-10 ENCOUNTER — Encounter (HOSPITAL_COMMUNITY)
Admission: RE | Admit: 2020-04-10 | Discharge: 2020-04-10 | Disposition: A | Discharge status: Home / Self Care | Payer: Medicare Other | Source: Ambulatory Visit | Attending: Urology | Admitting: Urology

## 2020-04-10 ENCOUNTER — Encounter (HOSPITAL_COMMUNITY): Payer: Self-pay

## 2020-04-10 DIAGNOSIS — Z01812 Encounter for preprocedural laboratory examination: Secondary | ICD-10-CM | POA: Diagnosis not present

## 2020-04-10 LAB — BASIC METABOLIC PANEL
Anion gap: 14 (ref 5–15)
BUN: 27 mg/dL — ABNORMAL HIGH (ref 6–20)
CO2: 21 mmol/L — ABNORMAL LOW (ref 22–32)
Calcium: 9.8 mg/dL (ref 8.9–10.3)
Chloride: 104 mmol/L (ref 98–111)
Creatinine, Ser: 1.64 mg/dL — ABNORMAL HIGH (ref 0.44–1.00)
GFR calc Af Amer: 42 mL/min — ABNORMAL LOW (ref >60)
GFR calc non Af Amer: 36 mL/min — ABNORMAL LOW (ref >60)
Glucose, Bld: 182 mg/dL — ABNORMAL HIGH (ref 70–99)
Potassium: 4.2 mmol/L (ref 3.5–5.1)
Sodium: 139 mmol/L (ref 135–145)

## 2020-04-10 LAB — CBC
HCT: 32.7 % — ABNORMAL LOW (ref 36.0–46.0)
Hemoglobin: 10.4 g/dL — ABNORMAL LOW (ref 12.0–15.0)
MCH: 27.6 pg (ref 26.0–34.0)
MCHC: 31.8 g/dL (ref 30.0–36.0)
MCV: 86.7 fL (ref 80.0–100.0)
Platelets: 300 10*3/uL (ref 150–400)
RBC: 3.77 MIL/uL — ABNORMAL LOW (ref 3.87–5.11)
RDW: 14.9 % (ref 11.5–15.5)
WBC: 15.6 10*3/uL — ABNORMAL HIGH (ref 4.0–10.5)
nRBC: 0 % (ref 0.0–0.2)

## 2020-04-10 LAB — GLUCOSE, CAPILLARY: Glucose-Capillary: 155 mg/dL — ABNORMAL HIGH (ref 70–99)

## 2020-04-10 NOTE — Patient Instructions (Addendum)
DUE TO COVID-19 ONLY ONE VISITOR IS ALLOWED TO COME WITH YOU AND STAY IN THE WAITING ROOM ONLY DURING PRE OP AND PROCEDURE DAY OF SURGERY. THE 1 VISITOR MAY VISIT WITH YOU AFTER SURGERY IN YOUR PRIVATE ROOM DURING VISITING HOURS ONLY!  YOU NEED TO HAVE A COVID 19 TEST ON 04-12-20 @ 1:00 PM, THIS TEST MUST BE DONE BEFORE SURGERY, COME  [address], [place] , [address].  ([place]) ONCE YOUR COVID TEST IS COMPLETED, PLEASE BEGIN THE QUARANTINE INSTRUCTIONS AS OUTLINED IN YOUR HANDOUT.                TAYIA STONESIFER  04/10/2020   Your procedure is scheduled on: 04-16-20   Report to [facility] Main  Entrance    Report to admitting at 6:30 AM     Call this number if you have problems the morning of surgery [phone]    Remember: Do not eat food or drink liquids :After Midnight.      Take these medicines the morning of surgery with A SIP OF WATER: Amiodarone (Pacerone), Carvedilol (Coreg), Colchicine (Flonase), Montelukast (Singulair), Pantoprazole (Protonix)  BRUSH YOUR TEETH MORNING OF SURGERY AND RINSE YOUR MOUTH OUT, NO CHEWING GUM CANDY OR MINTS.  DO NOT TAKE ANY DIABETIC MEDICATIONS DAY OF YOUR SURGERY                               You may not have any metal on your body including hair pins and              piercings    Do not wear jewelry, make-up, lotions, powders or perfumes, deodorant              Do not wear nail polish on your fingernails.  Do not shave  48 hours prior to surgery.              Do not bring valuables to the hospital. [place].  Contacts, dentures or bridgework may not be worn into surgery.       Patients discharged the day of surgery will not be allowed to drive home. IF YOU ARE HAVING SURGERY AND GOING HOME THE SAME DAY, YOU MUST HAVE AN ADULT TO DRIVE YOU HOME AND BE WITH YOU FOR 24 HOURS. YOU MAY GO HOME BY TAXI OR UBER OR ORTHERWISE, BUT AN ADULT MUST ACCOMPANY YOU  HOME AND STAY WITH YOU FOR 24 HOURS.  Name and phone number of your driver: Zannie Kehr [phone]  Special Instructions: N/A              Please read over the following fact sheets you were given: _____________________________________________________________________  How to Manage Your Diabetes Before and After Surgery  Why is it important to control my blood sugar before and after surgery? . Improving blood sugar levels before and after surgery helps healing and can limit problems. . A way of improving blood sugar control is eating a healthy diet by: o  Eating less sugar and carbohydrates o  Increasing activity/exercise o  Talking with your doctor about reaching your blood sugar goals . High blood sugars (greater than 180 mg/dL) can raise your risk of infections and slow your recovery, so you will need to focus on controlling your diabetes during the weeks before surgery. Marland Kitchen  Make sure that the doctor who takes care of your diabetes knows about your planned surgery including the date and location.  How do I manage my blood sugar before surgery? . Check your blood sugar at least 4 times a day, starting 2 days before surgery, to make sure that the level is not too high or low. o Check your blood sugar the morning of your surgery when you wake up and every 2 hours until you get to the Short Stay unit. . If your blood sugar is less than 70 mg/dL, you will need to treat for low blood sugar: o Do not take insulin. o Treat a low blood sugar (less than 70 mg/dL) with  cup of clear juice (cranberry or apple), 4 glucose tablets, OR glucose gel. o Recheck blood sugar in 15 minutes after treatment (to make sure it is greater than 70 mg/dL). If your blood sugar is not greater than 70 mg/dL on recheck, call [phone] for further instructions. . Report your blood sugar to the short stay nurse when you get to Short Stay.  . If you are admitted to the hospital after surgery: o Your blood sugar  will be checked by the staff and you will probably be given insulin after surgery (instead of oral diabetes medicines) to make sure you have good blood sugar levels. o The goal for blood sugar control after surgery is 80-180 mg/dL.   WHAT DO I DO ABOUT MY DIABETES MEDICATION?  Marland Kitchen Do not take oral diabetes medicines (pills) the morning of surgery.  . THE DAY BEFORE SURGERY, take your prescribed AM dose of 50 units of Novolin 70/30  Insulin. However only take 35 U of Novolin 70/30 for your dinner dose. You can also take your prescribed Metformin       Reviewed and Endorsed by [facility] Patient Education Committee, August 2015           [facility] - Preparing for Surgery Before surgery, you can play an important role.  Because skin is not sterile, your skin needs to be as free of germs as possible.  You can reduce the number of germs on your skin by washing with CHG (chlorahexidine gluconate) soap before surgery.  CHG is an antiseptic cleaner which kills germs and bonds with the skin to continue killing germs even after washing. Please DO NOT use if you have an allergy to CHG or antibacterial soaps.  If your skin becomes reddened/irritated stop using the CHG and inform your nurse when you arrive at Short Stay. Do not shave (including legs and underarms) for at least 48 hours prior to the first CHG shower.  You may shave your face/neck. Please follow these instructions carefully:  1.  Shower with CHG Soap the night before surgery and the  morning of Surgery.  2.  If you choose to wash your hair, wash your hair first as usual with your  normal  shampoo.  3.  After you shampoo, rinse your hair and body thoroughly to remove the  shampoo.                           4.  Use CHG as you would any other liquid soap.  You can apply chg directly  to the skin and wash                       Gently with a scrungie or clean washcloth.  5.  Apply the CHG Soap to your body ONLY FROM THE NECK DOWN.   Do not use  on face/ open                           Wound or open sores. Avoid contact with eyes, ears mouth and genitals (private parts).                       Wash face,  Genitals (private parts) with your normal soap.             6.  Wash thoroughly, paying special attention to the area where your surgery  will be performed.  7.  Thoroughly rinse your body with warm water from the neck down.  8.  DO NOT shower/wash with your normal soap after using and rinsing off  the CHG Soap.                9.  Pat yourself dry with a clean towel.            10.  Wear clean pajamas.            11.  Place clean sheets on your bed the night of your first shower and do not  sleep with pets. Day of Surgery : Do not apply any lotions/deodorants the morning of surgery.  Please wear clean clothes to the hospital/surgery center.  FAILURE TO FOLLOW THESE INSTRUCTIONS MAY RESULT IN THE CANCELLATION OF YOUR SURGERY PATIENT SIGNATURE_________________________________  NURSE SIGNATURE__________________________________  ________________________________________________________________________

## 2020-04-11 NOTE — Progress Notes (Signed)
COVID Vaccine Completed: Date COVID Vaccine completed: COVID vaccine manufacturer: [place]   No back stimulator  PCP - Shirline Frees, MD Cardiologist - Fransico Him, MD w/ clearance date 03-14-20 in telephone encounter  Chest x-ray - 03-12-20 CT Chest w/contrast EKG - 03-06-20 Stress Test -  ECHO - 02-29-20 to repeat in 4 weeks Cardiac Cath -   Sleep Study -  CPAP -   HGA1C on 03-22-20 Fasting Blood Sugar - 100- Checks Blood Sugar _2____ times a day  Blood Thinner Instructions: Eliquis (per special needs instructions in OR schedule, pt to continue Eliquis) Aspirin Instructions: Last Dose:  Anesthesia review:   Patient denies shortness of breath, fever, cough and chest pain at PAT appointment   Patient verbalized understanding of instructions that were given to them at the PAT appointment. Patient was also instructed that they will need to review over the PAT instructions again at home before surgery.

## 2020-04-11 NOTE — Progress Notes (Addendum)
BMP result routed to Dr. Claudia Desanctis for review. BMI of 58.73,  Angela, [place] made aware.

## 2020-04-12 ENCOUNTER — Other Ambulatory Visit (HOSPITAL_COMMUNITY)
Admission: RE | Admit: 2020-04-12 | Discharge: 2020-04-12 | Disposition: A | Discharge status: Home / Self Care | Payer: Medicare Other | Source: Ambulatory Visit | Attending: Urology | Admitting: Urology

## 2020-04-12 DIAGNOSIS — Z20822 Contact with and (suspected) exposure to covid-19: Secondary | ICD-10-CM | POA: Diagnosis not present

## 2020-04-12 DIAGNOSIS — Z01812 Encounter for preprocedural laboratory examination: Secondary | ICD-10-CM | POA: Insufficient documentation

## 2020-04-12 LAB — SARS CORONAVIRUS 2 (TAT 6-24 HRS): SARS Coronavirus 2: NEGATIVE

## 2020-04-13 NOTE — Anesthesia Preprocedure Evaluation (Addendum)
Anesthesia Evaluation  Patient identified by MRN, date of birth, ID band Patient awake    Reviewed: Allergy & Precautions, NPO status , Patient's Chart, lab work & pertinent test results  Airway Mallampati: III  TM Distance: >3 FB Neck ROM: Full  Mouth opening: Limited Mouth Opening  Dental no notable dental hx. (+) Teeth Intact, Dental Advisory Given   Pulmonary shortness of breath, asthma , sleep apnea and Continuous Positive Airway Pressure Ventilation , pneumonia,    Pulmonary exam normal breath sounds clear to auscultation       Cardiovascular hypertension, Pt. on medications Normal cardiovascular exam+ dysrhythmias Atrial Fibrillation  Rhythm:Regular Rate:Normal  Echo 02/29/2020: EF 60-65%, mild LVH, grade 1 DD, moderate pericardial effusion posterior to the left ventricle without evidence of cardiac tamponade     Neuro/Psych  Headaches,  Neuromuscular disease    GI/Hepatic Neg liver ROS, hiatal hernia, GERD  Controlled and Medicated,  Endo/Other  diabetesMorbid obesity  Renal/GU negative Renal ROS     Musculoskeletal   Abdominal (+) + obese,   Peds  Hematology   Anesthesia Other Findings   Reproductive/Obstetrics                            Anesthesia Physical Anesthesia Plan  ASA: IV  Anesthesia Plan: General   Post-op Pain Management:    Induction: Intravenous  PONV Risk Score and Plan: 3 and Treatment may vary due to age or medical condition, Ondansetron, Dexamethasone and Midazolam  Airway Management Planned: Video Laryngoscope Planned and Oral ETT  Additional Equipment: None  Intra-op Plan:   Post-operative Plan: Extubation in OR  Informed Consent:     Dental advisory given  Plan Discussed with:   Anesthesia Plan Comments:         Anesthesia Quick Evaluation

## 2020-04-15 MED ORDER — DEXTROSE 5 % IV SOLN
3.0000 g | INTRAVENOUS | Status: AC
  Administered 2020-04-16: 3 g via INTRAVENOUS
  Filled 2020-04-15: qty 3

## 2020-04-16 ENCOUNTER — Ambulatory Visit (HOSPITAL_COMMUNITY): Payer: Medicare Other

## 2020-04-16 ENCOUNTER — Encounter (HOSPITAL_COMMUNITY)
Admission: RE | Disposition: A | Discharge status: Home / Self Care | Payer: Self-pay | Source: Home / Self Care | Attending: Urology

## 2020-04-16 ENCOUNTER — Encounter (HOSPITAL_COMMUNITY): Payer: Self-pay | Admitting: Urology

## 2020-04-16 ENCOUNTER — Ambulatory Visit (HOSPITAL_COMMUNITY)
Admission: RE | Admit: 2020-04-16 | Discharge: 2020-04-16 | Disposition: A | Discharge status: Home / Self Care | Payer: Medicare Other | Attending: Urology | Admitting: Urology

## 2020-04-16 ENCOUNTER — Ambulatory Visit (HOSPITAL_COMMUNITY): Payer: Medicare Other | Admitting: Anesthesiology

## 2020-04-16 DIAGNOSIS — G4730 Sleep apnea, unspecified: Secondary | ICD-10-CM | POA: Insufficient documentation

## 2020-04-16 DIAGNOSIS — Z7982 Long term (current) use of aspirin: Secondary | ICD-10-CM | POA: Insufficient documentation

## 2020-04-16 DIAGNOSIS — Z6841 Body Mass Index (BMI) 40.0 and over, adult: Secondary | ICD-10-CM | POA: Insufficient documentation

## 2020-04-16 DIAGNOSIS — Z794 Long term (current) use of insulin: Secondary | ICD-10-CM | POA: Insufficient documentation

## 2020-04-16 DIAGNOSIS — Z7901 Long term (current) use of anticoagulants: Secondary | ICD-10-CM | POA: Diagnosis not present

## 2020-04-16 DIAGNOSIS — F419 Anxiety disorder, unspecified: Secondary | ICD-10-CM | POA: Diagnosis not present

## 2020-04-16 DIAGNOSIS — N200 Calculus of kidney: Secondary | ICD-10-CM | POA: Diagnosis present

## 2020-04-16 DIAGNOSIS — N201 Calculus of ureter: Secondary | ICD-10-CM | POA: Insufficient documentation

## 2020-04-16 DIAGNOSIS — Z79899 Other long term (current) drug therapy: Secondary | ICD-10-CM | POA: Diagnosis not present

## 2020-04-16 DIAGNOSIS — Z791 Long term (current) use of non-steroidal anti-inflammatories (NSAID): Secondary | ICD-10-CM | POA: Insufficient documentation

## 2020-04-16 DIAGNOSIS — I10 Essential (primary) hypertension: Secondary | ICD-10-CM | POA: Diagnosis not present

## 2020-04-16 DIAGNOSIS — E119 Type 2 diabetes mellitus without complications: Secondary | ICD-10-CM | POA: Diagnosis not present

## 2020-04-16 HISTORY — PX: HOLMIUM LASER APPLICATION: SHX5852

## 2020-04-16 HISTORY — PX: CYSTOSCOPY WITH RETROGRADE PYELOGRAM, URETEROSCOPY AND STENT PLACEMENT: SHX5789

## 2020-04-16 LAB — GLUCOSE, CAPILLARY
Glucose-Capillary: 118 mg/dL — ABNORMAL HIGH (ref 70–99)
Glucose-Capillary: 136 mg/dL — ABNORMAL HIGH (ref 70–99)

## 2020-04-16 LAB — HEMOGLOBIN AND HEMATOCRIT, BLOOD
HCT: 30.8 % — ABNORMAL LOW (ref 36.0–46.0)
Hemoglobin: 9.4 g/dL — ABNORMAL LOW (ref 12.0–15.0)

## 2020-04-16 LAB — HCG, SERUM, QUALITATIVE: Preg, Serum: NEGATIVE

## 2020-04-16 SURGERY — CYSTOURETEROSCOPY, WITH RETROGRADE PYELOGRAM AND STENT INSERTION
Anesthesia: General | Site: Ureter | Laterality: Left

## 2020-04-16 MED ORDER — MIDAZOLAM HCL 2 MG/2ML IJ SOLN
INTRAMUSCULAR | Status: AC
  Filled 2020-04-16: qty 2

## 2020-04-16 MED ORDER — SUGAMMADEX SODIUM 200 MG/2ML IV SOLN
INTRAVENOUS | Status: DC | PRN
  Administered 2020-04-16: 350 mg via INTRAVENOUS

## 2020-04-16 MED ORDER — FENTANYL CITRATE (PF) 100 MCG/2ML IJ SOLN: 25.0000 ug | INTRAMUSCULAR | Status: DC | PRN

## 2020-04-16 MED ORDER — CEPHALEXIN 500 MG PO CAPS: 500.0000 mg | ORAL_CAPSULE | Freq: Once | ORAL | 0 refills | Status: AC

## 2020-04-16 MED ORDER — LIDOCAINE 2% (20 MG/ML) 5 ML SYRINGE
INTRAMUSCULAR | Status: DC | PRN
  Administered 2020-04-16: 100 mg via INTRAVENOUS

## 2020-04-16 MED ORDER — SUCCINYLCHOLINE CHLORIDE 200 MG/10ML IV SOSY
PREFILLED_SYRINGE | INTRAVENOUS | Status: DC | PRN
  Administered 2020-04-16: 140 mg via INTRAVENOUS

## 2020-04-16 MED ORDER — LIDOCAINE 2% (20 MG/ML) 5 ML SYRINGE
INTRAMUSCULAR | Status: AC
  Filled 2020-04-16: qty 5

## 2020-04-16 MED ORDER — ACETAMINOPHEN 500 MG PO TABS: 1000.0000 mg | ORAL_TABLET | Freq: Once | ORAL | Status: DC

## 2020-04-16 MED ORDER — ROCURONIUM BROMIDE 10 MG/ML (PF) SYRINGE
PREFILLED_SYRINGE | INTRAVENOUS | Status: AC
  Filled 2020-04-16: qty 10

## 2020-04-16 MED ORDER — SUCCINYLCHOLINE CHLORIDE 200 MG/10ML IV SOSY
PREFILLED_SYRINGE | INTRAVENOUS | Status: AC
  Filled 2020-04-16: qty 10

## 2020-04-16 MED ORDER — PROPOFOL 500 MG/50ML IV EMUL
INTRAVENOUS | Status: AC
  Filled 2020-04-16: qty 50

## 2020-04-16 MED ORDER — CHLORHEXIDINE GLUCONATE 0.12 % MT SOLN
15.0000 mL | Freq: Once | OROMUCOSAL | Status: AC
  Administered 2020-04-16: 15 mL via OROMUCOSAL

## 2020-04-16 MED ORDER — ORAL CARE MOUTH RINSE: 15.0000 mL | Freq: Once | OROMUCOSAL | Status: AC

## 2020-04-16 MED ORDER — ROCURONIUM BROMIDE 10 MG/ML (PF) SYRINGE
PREFILLED_SYRINGE | INTRAVENOUS | Status: DC | PRN
  Administered 2020-04-16: 25 mg via INTRAVENOUS
  Administered 2020-04-16: 10 mg via INTRAVENOUS
  Administered 2020-04-16: 5 mg via INTRAVENOUS
  Administered 2020-04-16: 10 mg via INTRAVENOUS

## 2020-04-16 MED ORDER — ONDANSETRON HCL 4 MG/2ML IJ SOLN
INTRAMUSCULAR | Status: AC
  Filled 2020-04-16: qty 2

## 2020-04-16 MED ORDER — PROPOFOL 10 MG/ML IV BOLUS
INTRAVENOUS | Status: DC | PRN
  Administered 2020-04-16: 200 mg via INTRAVENOUS

## 2020-04-16 MED ORDER — OXYCODONE HCL 5 MG/5ML PO SOLN: 5.0000 mg | Freq: Once | ORAL | Status: DC | PRN

## 2020-04-16 MED ORDER — DEXAMETHASONE SODIUM PHOSPHATE 10 MG/ML IJ SOLN
INTRAMUSCULAR | Status: DC | PRN
  Administered 2020-04-16: 10 mg via INTRAVENOUS

## 2020-04-16 MED ORDER — DEXAMETHASONE SODIUM PHOSPHATE 10 MG/ML IJ SOLN
INTRAMUSCULAR | Status: AC
  Filled 2020-04-16: qty 1

## 2020-04-16 MED ORDER — IOHEXOL 300 MG/ML  SOLN
INTRAMUSCULAR | Status: DC | PRN
  Administered 2020-04-16: 30 mL

## 2020-04-16 MED ORDER — DROPERIDOL 2.5 MG/ML IJ SOLN: 0.6250 mg | Freq: Once | INTRAMUSCULAR | Status: DC | PRN

## 2020-04-16 MED ORDER — FENTANYL CITRATE (PF) 100 MCG/2ML IJ SOLN
INTRAMUSCULAR | Status: AC
  Filled 2020-04-16: qty 2

## 2020-04-16 MED ORDER — ONDANSETRON HCL 4 MG/2ML IJ SOLN: 4.0000 mg | Freq: Once | INTRAMUSCULAR | Status: DC | PRN

## 2020-04-16 MED ORDER — OXYCODONE-ACETAMINOPHEN 5-325 MG PO TABS: 1.0000 | ORAL_TABLET | ORAL | 0 refills | Status: DC | PRN

## 2020-04-16 MED ORDER — ONDANSETRON HCL 4 MG/2ML IJ SOLN
INTRAMUSCULAR | Status: DC | PRN
  Administered 2020-04-16: 4 mg via INTRAVENOUS

## 2020-04-16 MED ORDER — PROPOFOL 10 MG/ML IV BOLUS
INTRAVENOUS | Status: AC
  Filled 2020-04-16: qty 20

## 2020-04-16 MED ORDER — SODIUM CHLORIDE 0.9 % IR SOLN
Status: DC | PRN
  Administered 2020-04-16: 3000 mL

## 2020-04-16 MED ORDER — ESMOLOL HCL 100 MG/10ML IV SOLN
INTRAVENOUS | Status: AC
  Filled 2020-04-16: qty 10

## 2020-04-16 MED ORDER — LACTATED RINGERS IV SOLN: INTRAVENOUS | Status: DC

## 2020-04-16 MED ORDER — FENTANYL CITRATE (PF) 100 MCG/2ML IJ SOLN
INTRAMUSCULAR | Status: DC | PRN
  Administered 2020-04-16 (×5): 25 ug via INTRAVENOUS
  Administered 2020-04-16: 75 ug via INTRAVENOUS

## 2020-04-16 MED ORDER — ACETAMINOPHEN 10 MG/ML IV SOLN: 1000.0000 mg | Freq: Once | INTRAVENOUS | Status: DC | PRN

## 2020-04-16 MED ORDER — ACETAMINOPHEN 500 MG PO TABS
ORAL_TABLET | ORAL | Status: AC
  Administered 2020-04-16: 1000 mg
  Filled 2020-04-16: qty 2

## 2020-04-16 MED ORDER — MIDAZOLAM HCL 2 MG/2ML IJ SOLN
INTRAMUSCULAR | Status: DC | PRN
  Administered 2020-04-16: 2 mg via INTRAVENOUS

## 2020-04-16 MED ORDER — OXYCODONE HCL 5 MG PO TABS: 5.0000 mg | ORAL_TABLET | Freq: Once | ORAL | Status: DC | PRN

## 2020-04-16 SURGICAL SUPPLY — 22 items
BAG URO CATCHER STRL LF (MISCELLANEOUS) ×3 IMPLANT
BASKET ZERO TIP NITINOL 2.4FR (BASKET) ×4 IMPLANT
BSKT STON RTRVL ZERO TP 2.4FR (BASKET) ×2
CATH URET 5FR 28IN OPEN ENDED (CATHETERS) ×3 IMPLANT
CLOTH BEACON ORANGE TIMEOUT ST (SAFETY) ×3 IMPLANT
DRSG TEGADERM 2-3/8X2-3/4 SM (GAUZE/BANDAGES/DRESSINGS) ×3 IMPLANT
EXTRACTOR STONE 1.7FRX115CM (UROLOGICAL SUPPLIES) IMPLANT
FIBER LASER FLEXIVA 365 (UROLOGICAL SUPPLIES) ×1 IMPLANT
FIBER LASER TRAC TIP (UROLOGICAL SUPPLIES) ×3 IMPLANT
GLOVE BIO SURGEON STRL SZ 6.5 (GLOVE) ×2 IMPLANT
GLOVE BIO SURGEONS STRL SZ 6.5 (GLOVE) ×1
GOWN STRL REUS W/TWL LRG LVL3 (GOWN DISPOSABLE) ×3 IMPLANT
GUIDEWIRE STR DUAL SENSOR (WIRE) ×5 IMPLANT
KIT TURNOVER KIT A (KITS) IMPLANT
MANIFOLD NEPTUNE II (INSTRUMENTS) ×3 IMPLANT
PACK CYSTO (CUSTOM PROCEDURE TRAY) ×3 IMPLANT
SHEATH URETERAL 12FRX28CM (UROLOGICAL SUPPLIES) IMPLANT
SHEATH URETERAL 12FRX35CM (MISCELLANEOUS) ×2 IMPLANT
STENT URET 6FRX26 CONTOUR (STENTS) ×2 IMPLANT
TUBING CONNECTING 10 (TUBING) ×2 IMPLANT
TUBING CONNECTING 10' (TUBING) ×1
TUBING UROLOGY SET (TUBING) ×3 IMPLANT

## 2020-04-16 NOTE — Op Note (Signed)
Preoperative diagnosis: left renalcalculus  Postoperative diagnosis: left ureteral calculus  Procedure:  1. Cystoscopy 2. left ureteroscopy, laser lithotripsy and stone removal 3. left 69F x 26 ureteral stent placement  4. left retrograde pyelography with interpretation  Surgeon: Jacalyn Lefevre, MD  Anesthesia: General  Complications: None  Intraoperative findings: left retrograde pyelography demonstrated a filling defect within the left lower pole consistent with the patient's known calculus without other abnormalities.  EBL: Minimal  Specimens: 1. left renalcalculus  Disposition of specimens: [facility] for stone analysis  Indication: Holly [name] is a 54 y.o.   patient with a non-obstructing 9 mm left lower pole renal stone and associated left symptoms.  She previously underwent right ureteroscopy with laser lithotripsy and stent placement as well as left ureteral stent placement.  Her stent became dislodged over the weekend however she had been dilated with a stent for approximately 2 weeks.  After reviewing the management options for treatment, the patient elected to proceed with the above surgical procedure(s). We have discussed the potential benefits and risks of the procedure, side effects of the proposed treatment, the likelihood of the patient achieving the goals of the procedure, and any potential problems that might occur during the procedure or recuperation. Informed consent has been obtained.   Description of procedure:  The patient was taken to the operating room and general anesthesia was induced.  The patient was placed in the dorsal lithotomy position, prepped and draped in the usual sterile fashion, and preoperative antibiotics were administered. A preoperative time-out was performed.   Cystourethroscopy was performed.  The patient's urethra was examined and was normal. The bladder was then systematically examined in its entirety. There was no  evidence for any bladder tumors, stones, or other mucosal pathology.  Patient did have some blood clots in the bladder which is not unexpected with ureteral stent in place as well as patient on aspirin and Eliquis.  Attention then turned to the left ureteral orifice and a ureteral catheter was used to intubate the ureteral orifice.  Omnipaque contrast was injected through the ureteral catheter and a retrograde pyelogram was performed with findings as dictated above.  A 0.38 sensor guidewire was then advanced up the left ureter into the renal pelvis under fluoroscopic guidance.  A second wire was then placed alongside the first wire and advanced to the kidney with fluoroscopic guidance.  The ureteral access sheath was then placed over the second wire with fluoroscopic guidance and the inner sheath and wire removed.  Flexible ureteroscopy then took place.  There were several small stones seen in the midpole as well as a larger 9 mm stone seen in the lower pole.  The stones were fragmented with a 200 m laser fiber to less than 2 mm in size.  Basket stone extraction then took place until all fragments were deemed small enough to pass.  Visualization was quite poor due to patient being on Eliquis and aspirin however no large stones were seen at the end of the case.   Reinspection of the ureter revealed no remaining visible stones or fragments.  The ureteral access sheath was removed and unison with the ureteroscope.  The wire was then backloaded through the cystoscope and a ureteral stent was advance over the wire using Seldinger technique.  The stent was positioned appropriately under fluoroscopic and cystoscopic guidance.  The wire was then removed with an adequate stent curl noted in the renal pelvis as well as in the bladder.  The bladder  was then emptied and the procedure ended.  The patient appeared to tolerate the procedure well and without complications.  The patient was able to be awakened and  transferred to the recovery unit in satisfactory condition.   Disposition: The tether of the stent was left on and tucked inside the patient's vagina.  Instructions for removing the stent have been provided to the patient in 3 days.  She will follow-up in approximately 3 weeks.

## 2020-04-16 NOTE — Anesthesia Procedure Notes (Signed)
Procedure Name: Intubation Date/Time: 04/16/2020 9:18 AM Performed by: Silas Sacramento, CRNA Pre-anesthesia Checklist: Patient identified, Emergency Drugs available, Suction available and Patient being monitored Patient Re-evaluated:Patient Re-evaluated prior to induction Oxygen Delivery Method: Circle system utilized Preoxygenation: Pre-oxygenation with 100% oxygen Induction Type: IV induction and Rapid sequence Laryngoscope Size: Glidescope and 3 Grade View: Grade I Tube type: Oral Tube size: 7.0 mm Number of attempts: 1 Airway Equipment and Method: Rigid stylet and Video-laryngoscopy Placement Confirmation: ETT inserted through vocal cords under direct vision,  positive ETCO2 and breath sounds checked- equal and bilateral Secured at: 22 cm Tube secured with: Tape Dental Injury: Teeth and Oropharynx as per pre-operative assessment

## 2020-04-16 NOTE — Interval H&P Note (Signed)
History and Physical Interval Note: Patient returns for left ureteroscopy, laser lithotripsy, stent exchange.  Right stent removed in interim.  Patient had also been prescribed course of antibiotics for bacteriuria.  04/16/2020 7:26 AM  Genevie Cheshire  has presented today for surgery, with the diagnosis of LEFT RENAL CALCULUS.  The various methods of treatment have been discussed with the patient and family. After consideration of risks, benefits and other options for treatment, the patient has consented to  Procedure(s) with comments: CYSTOSCOPY WITH RETROGRADE PYELOGRAM, URETEROSCOPY AND STENT PLACEMENT (Left) - 76 MINS HOLMIUM LASER APPLICATION (Left) as a surgical intervention.  The patient's history has been reviewed, patient examined, no change in status, stable for surgery.  I have reviewed the patient's chart and labs.  Questions were answered to the patient's satisfaction.     Montrice Gracey D Makailyn Mccormick

## 2020-04-16 NOTE — Transfer of Care (Signed)
Immediate Anesthesia Transfer of Care Note  Patient: Holly [name]  Procedure(s) Performed: CYSTOSCOPY WITH RETROGRADE PYELOGRAM, URETEROSCOPY AND STENT PLACEMENT (Left Ureter) HOLMIUM LASER APPLICATION (Left Ureter)  Patient Location: PACU  Anesthesia Type:General  Level of Consciousness: awake, oriented, patient cooperative and responds to stimulation  Airway & Oxygen Therapy: Patient Spontanous Breathing and Patient connected to face mask oxygen  Post-op Assessment: Report given to RN and Post -op Vital signs reviewed and stable  Post vital signs: Reviewed and stable  Last Vitals:  Vitals Value Taken Time  BP 121/67 04/16/20 1036  Temp 36.7 C 04/16/20 1036  Pulse 77 04/16/20 1039  Resp 20 04/16/20 1039  SpO2 100 % 04/16/20 1039  Vitals shown include unvalidated device data.  Last Pain:  Vitals:   04/16/20 1036  TempSrc:   PainSc: 0-No pain         Complications: No complications documented.

## 2020-04-16 NOTE — Anesthesia Postprocedure Evaluation (Signed)
Anesthesia Post Note  Patient: Holly [name]  Procedure(s) Performed: CYSTOSCOPY WITH RETROGRADE PYELOGRAM, URETEROSCOPY AND STENT PLACEMENT (Left Ureter) HOLMIUM LASER APPLICATION (Left Ureter)     Patient location during evaluation: PACU Anesthesia Type: General Level of consciousness: awake and alert Pain management: pain level controlled Vital Signs Assessment: post-procedure vital signs reviewed and stable Respiratory status: spontaneous breathing, nonlabored ventilation, respiratory function stable and patient connected to nasal cannula oxygen Cardiovascular status: blood pressure returned to baseline and stable Postop Assessment: no apparent nausea or vomiting Anesthetic complications: no   No complications documented.  Last Vitals:  Vitals:   04/16/20 1100 04/16/20 1114  BP: 130/65 (!) 158/78  Pulse: 81 89  Resp: 16 20  Temp: 36.8 C 36.5 C  SpO2: 95% 95%    Last Pain:  Vitals:   04/16/20 1114  TempSrc: Oral  PainSc:                  Barnet Glasgow

## 2020-04-16 NOTE — Discharge Instructions (Signed)
DISCHARGE INSTRUCTIONS FOR KIDNEY STONE/URETERAL STENT   MEDICATIONS:  1.  Resume all your other meds from home - except do not take any extra narcotic pain meds that you may have at home.  2. Azo OTC can be taken to help with the burning/stinging when you urinate. 3. Percocet is for moderate/severe pain, otherwise taking upto 1000 mg every 6 hours of plainTylenol will help treat your pain.   4. Take Keflex one hour prior to removal of your stent.    ACTIVITY:  1. No strenuous activity x 1week  2. No driving while on narcotic pain medications  3. Drink plenty of water  4. Continue to walk at home - you can still get blood clots when you are at home, so keep active, but don't over do it.  5. May return to work/school tomorrow or when you feel ready   BATHING:  1. You can shower and we recommend daily showers  2. You have a string coming from your urethra: The stent string is attached to your ureteral stent. Do not pull on this.   SIGNS/SYMPTOMS TO CALL:  Please call us if you have a fever greater than 101.5, uncontrolled nausea/vomiting, uncontrolled pain, dizziness, unable to urinate, bloody urine, chest pain, shortness of breath, leg swelling, leg pain, redness around wound, drainage from wound, or any other concerns or questions.   You can reach [place] at [phone].   FOLLOW-UP:  1. You have a string attached to your stent, you may remove it on Friday, July 9. To do this, pull the string until the stents are completely removed. You may feel an odd sensation in your back.

## 2020-04-17 ENCOUNTER — Encounter (HOSPITAL_COMMUNITY): Payer: Self-pay | Admitting: Urology

## 2020-05-02 ENCOUNTER — Ambulatory Visit (HOSPITAL_COMMUNITY): Payer: Medicare Other | Attending: Cardiology

## 2020-05-02 ENCOUNTER — Other Ambulatory Visit: Payer: Self-pay

## 2020-05-02 DIAGNOSIS — I3139 Other pericardial effusion (noninflammatory): Secondary | ICD-10-CM

## 2020-05-02 DIAGNOSIS — I313 Pericardial effusion (noninflammatory): Secondary | ICD-10-CM | POA: Diagnosis not present

## 2020-05-02 LAB — ECHOCARDIOGRAM COMPLETE
AR max vel: 3.45 cm2
AV Area VTI: 3.53 cm2
AV Area mean vel: 3.7 cm2
AV Mean grad: 7 mmHg
AV Peak grad: 15.4 mmHg
Ao pk vel: 1.97 m/s
Area-P 1/2: 2.57 cm2
S' Lateral: 2.6 cm

## 2020-05-09 ENCOUNTER — Encounter: Payer: Self-pay | Admitting: Cardiology

## 2020-05-09 ENCOUNTER — Other Ambulatory Visit: Payer: Self-pay

## 2020-05-09 ENCOUNTER — Ambulatory Visit: Payer: Medicare Other | Admitting: Cardiology

## 2020-05-09 VITALS — BP 128/70 | HR 74 | Ht 67.0 in | Wt 360.0 lb

## 2020-05-09 DIAGNOSIS — I313 Pericardial effusion (noninflammatory): Secondary | ICD-10-CM

## 2020-05-09 DIAGNOSIS — I480 Paroxysmal atrial fibrillation: Secondary | ICD-10-CM

## 2020-05-09 DIAGNOSIS — I420 Dilated cardiomyopathy: Secondary | ICD-10-CM

## 2020-05-09 DIAGNOSIS — I3139 Other pericardial effusion (noninflammatory): Secondary | ICD-10-CM

## 2020-05-09 NOTE — Progress Notes (Signed)
Cardiology Office Note    Date:  05/09/2020   ID:  Holly [name], DOB [birth year], MRN [id number]  PCP:  Shirline Frees, MD  Cardiologist: Fransico Him, MD EPS: None  Chief Complaint  Patient presents with  . Atrial Fibrillation    History of Present Illness:  Holly [name] is a 54 y.o. female with history of morbid obesity, OSA on CPAP, anxiety depression, DM.  On 01/16/2020 during left ureteroscopy and stent placement she went into atrial fib with RVR.  This did not improve with IV diltiazem so she was started on IV amiodarone and underwent TEE/DCCV and converted to normal sinus rhythm.  2D echo showed large pericardial effusion and was placed on colchicine.  Patient was seen in the A. fib clinic 01/30/2020 who recommended waiting 1 month after cardioversion before she undergoes any further urological procedures.  Plan was for amiodarone to be a bridge to other options to control her A. fib in the future.  CHA2DS2-VASc equals 5 on Eliquis.  Colchicine dose lowered because of interaction with amiodarone.  Patient was seen back by Holly Husk, PA ad denied chest pain, shortness of breath. She was having a lot of pain since off of meloxicam while on colchicine and having to take hyrdocodone/voltaren/tylenol. She was in a wheel chair most of the time.  Repeat 2D echo last week showed normal LVF with resolution of pericardial effusion.    She is here today for followup and is doing well.  She denies any chest pain or pressure,  PND, orthopnea, LE edema, dizziness, palpitations or syncope. She has mild chronic DOE related to obesity and deconditioning.  She has had another urological procedure and her hematuria has resolved.  She is compliant with her meds and is tolerating meds with no SE.     Past Medical History:  Diagnosis Date  . Anxiety   . Arthritis    lower back  . Asthma    was told she cough variant asthma  . Atrial fibrillation ([place]) 01/2020  . Cancer ([place]) 2016  .  Carpal tunnel syndrome, bilateral   . Cluster headache   . Depression   . Diabetes mellitus without complication ([place])    type 2  . Endometrial polyp   . GERD (gastroesophageal reflux disease)   . Herniated disc, cervical   . History of hiatal hernia   . History of kidney stones   . Hypertension   . Morbid obesity ([place])   . Osteoarthritis of both knees   . Peripheral edema   . Pneumonia   . Shortness of breath dyspnea    on exertion  . Shoulder impingement    Bilateral  . Sleep apnea    CPAP    Past Surgical History:  Procedure Laterality Date  . ANKLE ARTHROSCOPY Right    bone spurs  . CARDIOVERSION N/A 01/19/2020   Procedure: CARDIOVERSION;  Surgeon: Fay Records, MD;  [facility];  Service: Cardiovascular;  Laterality: N/A;  . CYSTOSCOPY WITH RETROGRADE PYELOGRAM, URETEROSCOPY AND STENT PLACEMENT Bilateral 03/26/2020   Procedure: CYSTOSCOPY WITH RETROGRADE PYELOGRAM, URETEROSCOPY AND STENT PLACEMENT;  Surgeon: Robley Fries, MD;  Location: [facility];  Service: Urology;  Laterality: Bilateral;  90 MINS  . CYSTOSCOPY WITH RETROGRADE PYELOGRAM, URETEROSCOPY AND STENT PLACEMENT Left 04/16/2020   Procedure: CYSTOSCOPY WITH RETROGRADE PYELOGRAM, URETEROSCOPY AND STENT PLACEMENT;  Surgeon: Robley Fries, MD;  Location: [facility];  Service: Urology;  Laterality: Left;  90 MINS  . FOOT NEUROMA  SURGERY Left   . HOLMIUM LASER APPLICATION Bilateral 1/85/6314   Procedure: HOLMIUM LASER APPLICATION;  Surgeon: Robley Fries, MD;  Location: [facility];  Service: Urology;  Laterality: Bilateral;  . HOLMIUM LASER APPLICATION Left 06/18/262   Procedure: HOLMIUM LASER APPLICATION;  Surgeon: Robley Fries, MD;  Location: [facility];  Service: Urology;  Laterality: Left;  . HYSTEROSCOPY WITH D & C N/A 06/25/2015   Procedure: DILATATION AND CURETTAGE /HYSTEROSCOPY;  Surgeon: Brien Few, MD;  Location: [facility];  Service: Gynecology;  Laterality: N/A;  . IRRIGATION AND DEBRIDEMENT KNEE  Left    for staph infection x2  . KNEE ARTHROSCOPY Left   . TEAR DUCT PROBING Right   . TEE WITHOUT CARDIOVERSION N/A 01/19/2020   Procedure: TRANSESOPHAGEAL ECHOCARDIOGRAM (TEE);  Surgeon: Fay Records, MD;  Location: [facility];  Service: Cardiovascular;  Laterality: N/A;    Current Medications: Current Meds  Medication Sig  . acetaminophen (TYLENOL) 650 MG CR tablet Take 650 mg by mouth daily as needed for pain.   Marland Kitchen apixaban (ELIQUIS) 5 MG TABS tablet Take 1 tablet (5 mg total) by mouth 2 (two) times daily.  . Ascorbic Acid (VITAMIN C) 1000 MG tablet Take 1,000 mg by mouth in the morning and at bedtime.   Marland Kitchen atorvastatin (LIPITOR) 10 MG tablet Take 10 mg by mouth at bedtime.  . BD VEO INSULIN SYRINGE U/F 31G X 15/64" 1 ML MISC USE SYRINGE TO INJECT INSULIN 3 TIMES A DAY SUBCUTANEOUS FOR 30 DAYS  . calcium carbonate (OS-CAL) 600 MG TABS tablet Take 600 mg by mouth 2 (two) times daily with a meal.  . carvedilol (COREG) 3.125 MG tablet Take 1 tablet (3.125 mg total) by mouth 2 (two) times daily with a meal.  . Cholecalciferol (VITAMIN D) 50 MCG (2000 UT) tablet Take 2,000 Units by mouth in the morning and at bedtime.   . citalopram (CELEXA) 40 MG tablet Take 40 mg by mouth every evening.   . cyanocobalamin (,VITAMIN B-12,) 1000 MCG/ML injection Inject 1,000 mcg into the muscle every 30 (thirty) days.  . ferrous sulfate (IRON SUPPLEMENT) 325 (65 FE) MG tablet Take 325 mg by mouth daily with breakfast.  . fluticasone (FLONASE) 50 MCG/ACT nasal spray Place 2 sprays into both nostrils daily as needed for allergies or rhinitis.   Marland Kitchen HYDROcodone-acetaminophen (NORCO) 10-325 MG tablet Take 1 tablet by mouth every 6 (six) hours as needed (Pain).   . insulin NPH-regular Human (NOVOLIN 70/30) (70-30) 100 UNIT/ML injection Inject 50 Units into the skin 2 (two) times daily with a meal. Only if blood glucose is greater than 130 per patient  . Lancets (ONETOUCH DELICA PLUS [id number]) MISC   .  levocetirizine (XYZAL) 5 MG tablet Take 5 mg by mouth at bedtime.  Marland Kitchen losartan (COZAAR) 25 MG tablet Take 1 tablet (25 mg total) by mouth daily.  . metFORMIN (GLUCOPHAGE-XR) 500 MG 24 hr tablet Take 1,000 mg by mouth daily.   . montelukast (SINGULAIR) 10 MG tablet Take 10 mg by mouth daily.   . norethindrone (AYGESTIN) 5 MG tablet Take 20 mg by mouth daily.   Glory Rosebush VERIO test strip   . pantoprazole (PROTONIX) 40 MG tablet Take 40 mg by mouth daily.  Marland Kitchen Propylene Glycol-Glycerin (SOOTHE) 0.6-0.6 % SOLN Place 2 drops into both eyes daily as needed (Dry eye). Sooth  . solifenacin (VESICARE) 10 MG tablet Take 10 mg by mouth daily.  Marland Kitchen topiramate (TOPAMAX) 100 MG tablet Take 150 mg by  mouth every evening.   . vitamin B-12 (CYANOCOBALAMIN) 1000 MCG tablet Take 1,000 mcg by mouth daily.  . [DISCONTINUED] amiodarone (PACERONE) 200 MG tablet Starting on 5/19, take 1 tablet (200 mg) by mouth once a day  . [DISCONTINUED] colchicine 0.6 MG tablet Take 0.5 tablets (0.3 mg total) by mouth daily.     Allergies:   Levaquin [levofloxacin], Vancomycin, Victoza [liraglutide], and Tape   Social History   Socioeconomic History  . Marital status: Single    Spouse name: Not on file  . Number of children: Not on file  . Years of education: Not on file  . Highest education level: Not on file  Occupational History  . Not on file  Tobacco Use  . Smoking status: Never Smoker  . Smokeless tobacco: Never Used  Vaping Use  . Vaping Use: Never used  Substance and Sexual Activity  . Alcohol use: Not Currently  . Drug use: No  . Sexual activity: Not on file  Other Topics Concern  . Not on file  Social History Narrative  . Not on file   Social Determinants of Health   Financial Resource Strain:   . Difficulty of Paying Living Expenses:   Food Insecurity:   . Worried About Charity fundraiser in the Last Year:   . Arboriculturist in the Last Year:   Transportation Needs:   . Film/video editor  (Medical):   Marland Kitchen Lack of Transportation (Non-Medical):   Physical Activity:   . Days of Exercise per Week:   . Minutes of Exercise per Session:   Stress:   . Feeling of Stress :   Social Connections:   . Frequency of Communication with Friends and Family:   . Frequency of Social Gatherings with Friends and Family:   . Attends Religious Services:   . Active Member of Clubs or Organizations:   . Attends Archivist Meetings:   Marland Kitchen Marital Status:      Family History:  The patient's family history includes Atrial fibrillation in her mother; Emphysema in her father; Lung cancer in her father; Pneumonia in her mother; Rheum arthritis in her sister.   ROS:   Please see the history of present illness.    ROS All other systems reviewed and are negative.   PHYSICAL EXAM:   VS:  BP 128/70   Pulse 74   Ht 5\' 7"  (1.702 m)   Wt (!) 360 lb (163.3 kg)   SpO2 97%   BMI 56.38 kg/m   Physical Exam  GEN: Well nourished, well developed in no acute distress HEENT: Normal NECK: No JVD; No carotid bruits LYMPHATICS: No lymphadenopathy CARDIAC:RRR, no murmurs, rubs, gallops RESPIRATORY:  Clear to auscultation without rales, wheezing or rhonchi  ABDOMEN: Soft, non-tender, non-distended MUSCULOSKELETAL:  Chronic venous stasis skin changes No deformity  SKIN: Warm and dry NEUROLOGIC:  Alert and oriented x 3 PSYCHIATRIC:  Normal affect    Wt Readings from Last 3 Encounters:  05/09/20 (!) 360 lb (163.3 kg)  04/16/20 (!) 375 lb (170.1 kg)  04/10/20 (!) 375 lb (170.1 kg)      Studies/Labs Reviewed:   EKG:  EKG is not ordered today.    Recent Labs: 01/16/2020: TSH 2.116 01/18/2020: Magnesium 1.7 04/10/2020: BUN 27; Creatinine, Ser 1.64; Platelets 300; Potassium 4.2; Sodium 139 04/16/2020: Hemoglobin 9.4   Lipid Panel    Component Value Date/Time   CHOL 107 01/18/2020 0250   TRIG 112 01/18/2020 0250   HDL  20 (L) 01/18/2020 0250   CHOLHDL 5.4 01/18/2020 0250   VLDL 22 01/18/2020 0250     LDLCALC 65 01/18/2020 0250    Additional studies/ records that were reviewed today include:  Limited echo 01/20/2020 IMPRESSIONS     1. Technically difficult echo due to body habitus and poor image quality.  The LV function appears better than on previous echo. The large  pericardial effusion persists. There is no evidence of pericardial  tamponade.   2. Left ventricular ejection fraction, by estimation, is 55 to 60%. The  left ventricle has normal function.   3. Right ventricular systolic function was not well visualized. The right  ventricular size is not well visualized.   4. Large pericardial effusion. The pericardial effusion is  circumferential.   5. The IVCs is dilated but there is collapse with inspiration . The  inferior vena cava is dilated in size with >50% respiratory variability,  suggesting right atrial pressure of 8 mmHg.   TEE 01/19/2020 IMPRESSIONS     1. Left ventricular ejection fraction, by estimation, is 35 to 40%. The  left ventricle has moderately decreased function. The left ventricle  demonstrates global hypokinesis.   2. Right ventricular systolic function is normal. The right ventricular  size is normal.   3. No left atrial/left atrial appendage thrombus was detected.   4. The mitral valve is normal in structure. Moderate mitral valve  regurgitation.   5. The aortic valve is normal in structure. Aortic valve regurgitation is  mild.    Limited TEE 01/17/2020 IMPRESSIONS     1. RV cavity appears small, no clear diastolic collapse. Right  ventricular systolic function is normal. The right ventricular size is  normal.   2. Promient apical fat pad is noted. moderate to large circumferential  pericardial effusion (measuring between 1.8-2.4 cm).   3. No clear significant respiratory inflow variation.   4. Borderline tricuspid inflow variation with respiration.   Comparison(s): Prior images reviewed side by side. 01/17/20: LVEF 35-40%,  large  pericardial effusion, dilated IVC.   Conclusion(s)/Recommendation(s): Comparing both sets of images, there is a  moderate to large (>2.0 cm) circumferential pericardial effusion, as well  as a promient apical fat pad. The IVC is diltated and there is minimal  tricuspid, but no significant  mitral inflow variation with respiration. The RV appears normal diastolic  collapse is not noted. Tamponade physiology is difficult to ascertain in  the setting of cardiomyopathy, but not suspected. Vitals indicate  significant hypertension, which does not  suggest tamponade. Clinical correlation is always recommended.   2D echo 05/02/2020 IMPRESSIONS    1. Left ventricular ejection fraction, by estimation, is 60 to 65%. The  left ventricle has normal function. The left ventricle has no regional  wall motion abnormalities. Left ventricular diastolic parameters are  consistent with Grade II diastolic  dysfunction (pseudonormalization). Elevated left ventricular end-diastolic  pressure.  2. Right ventricular systolic function is normal. The right ventricular  size is normal.  3. Left atrial size was mildly dilated.  4. The mitral valve is normal in structure. No evidence of mitral valve  regurgitation. No evidence of mitral stenosis.  5. Normal trileaflet AV. No valve regurgitation is visualized. No aortic  stenosis is present. Aortic valve area, by VTI measures 3.53 cm. Aortic  valve mean gradient measures 7.0 mmHg. Aortic valve Vmax measures 1.96  m/s. DI is normal at 0.72.  6. The inferior vena cava is normal in size with greater than 50%  respiratory variability, suggesting right atrial pressure of 3 mmHg.   ASSESSMENT:   1.   1. PAF (paroxysmal atrial fibrillation) ([place])   2. Pericardial effusion   3. DCM (dilated cardiomyopathy) ([place])   4. Morbid obesity ([place])      PLAN:  In order of problems listed above:  1.  Paroxysmal atrial fibrillation -occurred in the setting of acute  anxiety the am of her urological procedure when placed on table to induce anesthesia -converted to normal sinus rhythm with TEE DCCV  -she has been on Amio and Eliquis followed in afib clinic -she has developed worsening tremors from her baseline on Amio -she has been maintaining NSR with no further surgical procedures planned -I will stop her Amio and let it wash out -followed in 3 months and if no reoccurence of her PAF then continue just on BB.  If she has a reoccurence then will consider Tikosyn or Flecainide  2.  Pericardial effusion  -this has resolved -she is having a lot of chronic pain and has not been able to take her NSAIDs due to being on colchicine  -will stop colchicine since it has been 4 months and effusion has resolved  3.  Cardiomyopathy  -likely tachy related from afib with RVR - EF 35 to 40% on initial echo and TEE .   -ollow-up echo 01/20/2020 normal LVEF 50 to 60%    Medication Adjustments/Labs and Tests Ordered: Current medicines are reviewed at length with the patient today.  Concerns regarding medicines are outlined above.  Medication changes, Labs and Tests ordered today are listed in the Patient Instructions below. Patient Instructions  Medication Instructions:  Your physician has recommended you make the following change in your medication:  1) STOP taking amoidarone 2) STOP taking colchicine  *If you need a refill on your cardiac medications before your next appointment, please call your pharmacy*  Follow-Up: At [facility], you and your health needs are our priority.  As part of our continuing mission to provide you with exceptional heart care, we have created designated Provider Care Teams.  These Care Teams include your primary Cardiologist (physician) and Advanced Practice Providers (APPs -  Physician Assistants and Nurse Practitioners) who all work together to provide you with the care you need, when you need it.   Your next appointment:  3  month(s)  The format for your next appointment:   In Person  Provider:   You may see Fransico Him, MD or one of the following Advanced Practice Providers on your designated Care Team:    Melina Copa, PA-C  Ermalinda Barrios, PA-C       Signed, Fransico Him, MD  05/09/2020 11:06 AM    [address], [place], [place]  [address] Phone: [phone]; Fax: [phone]

## 2020-05-09 NOTE — Patient Instructions (Signed)
Medication Instructions:  Your physician has recommended you make the following change in your medication:  1) STOP taking amoidarone 2) STOP taking colchicine  *If you need a refill on your cardiac medications before your next appointment, please call your pharmacy*  Follow-Up: At [facility], you and your health needs are our priority.  As part of our continuing mission to provide you with exceptional heart care, we have created designated Provider Care Teams.  These Care Teams include your primary Cardiologist (physician) and Advanced Practice Providers (APPs -  Physician Assistants and Nurse Practitioners) who all work together to provide you with the care you need, when you need it.   Your next appointment:  3 month(s)  The format for your next appointment:   In Person  Provider:   You may see Fransico Him, MD or one of the following Advanced Practice Providers on your designated Care Team:    Melina Copa, PA-C  Ermalinda Barrios, PA-C

## 2020-05-24 ENCOUNTER — Telehealth: Payer: Self-pay | Admitting: Cardiology

## 2020-05-24 ENCOUNTER — Other Ambulatory Visit: Payer: Self-pay

## 2020-05-24 MED ORDER — CARVEDILOL 3.125 MG PO TABS: 3.1250 mg | ORAL_TABLET | Freq: Two times a day (BID) | ORAL | 4 refills | Status: DC

## 2020-05-24 MED ORDER — LOSARTAN POTASSIUM 25 MG PO TABS: 25.0000 mg | ORAL_TABLET | Freq: Every day | ORAL | 10 refills | Status: DC

## 2020-05-24 NOTE — Telephone Encounter (Signed)
*  STAT* If patient is at the pharmacy, call can be transferred to refill team.   1. Which medications need to be refilled? (please list name of each medication and dose if known)  New prescription today please if possible- completely out of it-  Carvedilol and Losartan  2. Which pharmacy/location (including street and city if local pharmacy) is medication to be sent to? [facility] [address], [place]*  3. Do they need a 30 day or 90 day supply?  30 days supply for each

## 2020-06-10 ENCOUNTER — Encounter (HOSPITAL_COMMUNITY): Payer: Self-pay | Admitting: Nurse Practitioner

## 2020-06-10 ENCOUNTER — Other Ambulatory Visit: Payer: Self-pay

## 2020-06-10 ENCOUNTER — Ambulatory Visit (HOSPITAL_COMMUNITY)
Admission: RE | Admit: 2020-06-10 | Discharge: 2020-06-10 | Disposition: A | Discharge status: Home / Self Care | Payer: Medicare Other | Source: Ambulatory Visit | Attending: Nurse Practitioner | Admitting: Nurse Practitioner

## 2020-06-10 VITALS — BP 152/76 | HR 71 | Ht 67.0 in | Wt 364.0 lb

## 2020-06-10 DIAGNOSIS — E119 Type 2 diabetes mellitus without complications: Secondary | ICD-10-CM | POA: Diagnosis not present

## 2020-06-10 DIAGNOSIS — I10 Essential (primary) hypertension: Secondary | ICD-10-CM | POA: Diagnosis not present

## 2020-06-10 DIAGNOSIS — I480 Paroxysmal atrial fibrillation: Secondary | ICD-10-CM

## 2020-06-10 DIAGNOSIS — Z7901 Long term (current) use of anticoagulants: Secondary | ICD-10-CM | POA: Insufficient documentation

## 2020-06-10 DIAGNOSIS — Z79899 Other long term (current) drug therapy: Secondary | ICD-10-CM | POA: Insufficient documentation

## 2020-06-10 DIAGNOSIS — J45909 Unspecified asthma, uncomplicated: Secondary | ICD-10-CM | POA: Insufficient documentation

## 2020-06-10 DIAGNOSIS — I313 Pericardial effusion (noninflammatory): Secondary | ICD-10-CM | POA: Diagnosis not present

## 2020-06-10 DIAGNOSIS — K219 Gastro-esophageal reflux disease without esophagitis: Secondary | ICD-10-CM | POA: Insufficient documentation

## 2020-06-10 DIAGNOSIS — F419 Anxiety disorder, unspecified: Secondary | ICD-10-CM | POA: Insufficient documentation

## 2020-06-10 DIAGNOSIS — F329 Major depressive disorder, single episode, unspecified: Secondary | ICD-10-CM | POA: Insufficient documentation

## 2020-06-10 DIAGNOSIS — D6869 Other thrombophilia: Secondary | ICD-10-CM | POA: Diagnosis not present

## 2020-06-10 DIAGNOSIS — Z794 Long term (current) use of insulin: Secondary | ICD-10-CM | POA: Insufficient documentation

## 2020-06-10 DIAGNOSIS — G4730 Sleep apnea, unspecified: Secondary | ICD-10-CM | POA: Insufficient documentation

## 2020-06-10 DIAGNOSIS — I4891 Unspecified atrial fibrillation: Secondary | ICD-10-CM | POA: Diagnosis present

## 2020-06-10 NOTE — Progress Notes (Signed)
Primary Care Physician: Shirline Frees, MD Referring Physician: [facility] f/u Cardiologist: Dr. Suzette Battiest is a 54 y.o. female with a h/o morbid obesity,anxiety, depression,  DM, Sleep apnea treated with cpap, and recent kidney stones that presented for an elective cystoscopy 4/6 with retrograde pyelogram with left ureteroscopy and stent placement. In PACU, she developed significant tachycardia with rates up to 160 bpm. EKG significant for atrial fibrillation with RVR. Cardiology consulted and started patient on a Cardizem drip and admitted to stepdown unit. No improvement with cardizem drip and was started ohn IV amiodarone and ultimately underwent TEE/DCCV which was successful to restore SR. Urological procedures to be done later on outpt basis. 2 D Echo showed large pericardial effusion and was placed on colchicine on d/c.  She was d/c 4/10.   Pt is now in the afib clinic for f/u. SHe states there was discussion at the pharmcay for interaction between colchicine and amiodarone interaction and did not start taking either med until this past Monday, the 19th. Colchicine dose was reduced form the prior ordered dose. She  has not noted any further afib. She is in SR today.   She  denies alcohol use, no significant caffeine use. Uses CPAP. Has lost 50 lbs, unintentionally since the last of the year. She states that she has intermittent rt sided abdominal pain with lack of appetite. She has reported this to her PCP.   F/u in afib clinic, 06/10/20. She reports that Dr. Radford Pax stopped amiodarone 2 weeks ago as she has tremors in her hands at baseline, could not tell if amiodarone was contributing. No improvement so far. She also stopped colchicine. She continues on eliquis 5 mg bid  for a CHA2DS2VAScscore of 3(DM, female and prior mod reduced EF by initial echo, now improved to normal range). Per Dr. Theodosia Blender note, if return to afib, may be a candidate for flecainide ( will now improved EF) or  tikosyn. SHe would really like to get off eliquis so she can take antiinflammatories again.  EKG shows SR today.    Today, she denies symptoms of palpitations, chest pain, shortness of breath, orthopnea, PND, lower extremity edema, dizziness, presyncope, syncope, or neurologic sequela. The patient is tolerating medications without difficulties and is otherwise without complaint today.   Past Medical History:  Diagnosis Date  . Anxiety   . Arthritis    lower back  . Asthma    was told she cough variant asthma  . Atrial fibrillation ([place]) 01/2020  . Cancer ([place]) 2016  . Carpal tunnel syndrome, bilateral   . Cluster headache   . Depression   . Diabetes mellitus without complication ([place])    type 2  . Endometrial polyp   . GERD (gastroesophageal reflux disease)   . Herniated disc, cervical   . History of hiatal hernia   . History of kidney stones   . Hypertension   . Morbid obesity ([place])   . Osteoarthritis of both knees   . Peripheral edema   . Pneumonia   . Shortness of breath dyspnea    on exertion  . Shoulder impingement    Bilateral  . Sleep apnea    CPAP   Past Surgical History:  Procedure Laterality Date  . ANKLE ARTHROSCOPY Right    bone spurs  . CARDIOVERSION N/A 01/19/2020   Procedure: CARDIOVERSION;  Surgeon: Fay Records, MD;  Location: [facility];  Service: Cardiovascular;  Laterality: N/A;  . CYSTOSCOPY WITH RETROGRADE PYELOGRAM, URETEROSCOPY  AND STENT PLACEMENT Bilateral 03/26/2020   Procedure: CYSTOSCOPY WITH RETROGRADE PYELOGRAM, URETEROSCOPY AND STENT PLACEMENT;  Surgeon: Robley Fries, MD;  Location: [facility];  Service: Urology;  Laterality: Bilateral;  90 MINS  . CYSTOSCOPY WITH RETROGRADE PYELOGRAM, URETEROSCOPY AND STENT PLACEMENT Left 04/16/2020   Procedure: CYSTOSCOPY WITH RETROGRADE PYELOGRAM, URETEROSCOPY AND STENT PLACEMENT;  Surgeon: Robley Fries, MD;  Location: [facility];  Service: Urology;  Laterality: Left;  90 MINS  . FOOT NEUROMA SURGERY  Left   . HOLMIUM LASER APPLICATION Bilateral 05/25/4817   Procedure: HOLMIUM LASER APPLICATION;  Surgeon: Robley Fries, MD;  Location: [facility];  Service: Urology;  Laterality: Bilateral;  . HOLMIUM LASER APPLICATION Left 02/15/3148   Procedure: HOLMIUM LASER APPLICATION;  Surgeon: Robley Fries, MD;  Location: [facility];  Service: Urology;  Laterality: Left;  . HYSTEROSCOPY WITH D & C N/A 06/25/2015   Procedure: DILATATION AND CURETTAGE /HYSTEROSCOPY;  Surgeon: Brien Few, MD;  Location: [facility];  Service: Gynecology;  Laterality: N/A;  . IRRIGATION AND DEBRIDEMENT KNEE Left    for staph infection x2  . KNEE ARTHROSCOPY Left   . TEAR DUCT PROBING Right   . TEE WITHOUT CARDIOVERSION N/A 01/19/2020   Procedure: TRANSESOPHAGEAL ECHOCARDIOGRAM (TEE);  Surgeon: Fay Records, MD;  Location: [facility];  Service: Cardiovascular;  Laterality: N/A;    Current Outpatient Medications  Medication Sig Dispense Refill  . acetaminophen (TYLENOL) 650 MG CR tablet Take 650 mg by mouth daily as needed for pain.     Marland Kitchen apixaban (ELIQUIS) 5 MG TABS tablet Take 1 tablet (5 mg total) by mouth 2 (two) times daily. 60 tablet 3  . Ascorbic Acid (VITAMIN C) 1000 MG tablet Take 1,000 mg by mouth in the morning and at bedtime.     Marland Kitchen atorvastatin (LIPITOR) 10 MG tablet Take 10 mg by mouth at bedtime.    . BD VEO INSULIN SYRINGE U/F 31G X 15/64" 1 ML MISC USE SYRINGE TO INJECT INSULIN 3 TIMES A DAY SUBCUTANEOUS FOR 30 DAYS    . calcium carbonate (OS-CAL) 600 MG TABS tablet Take 600 mg by mouth 2 (two) times daily with a meal.    . carvedilol (COREG) 3.125 MG tablet Take 1 tablet (3.125 mg total) by mouth 2 (two) times daily with a meal. 60 tablet 4  . Cholecalciferol (VITAMIN D) 50 MCG (2000 UT) tablet Take 2,000 Units by mouth in the morning and at bedtime.     . citalopram (CELEXA) 40 MG tablet Take 40 mg by mouth every evening.     . cyanocobalamin (,VITAMIN B-12,) 1000 MCG/ML injection Inject 1,000 mcg into the  muscle every 30 (thirty) days.    . ferrous sulfate (IRON SUPPLEMENT) 325 (65 FE) MG tablet Take 325 mg by mouth daily with breakfast.    . fluticasone (FLONASE) 50 MCG/ACT nasal spray Place 2 sprays into both nostrils daily as needed for allergies or rhinitis.     Marland Kitchen HYDROcodone-acetaminophen (NORCO) 10-325 MG tablet Take 1 tablet by mouth every 6 (six) hours as needed (Pain).     . insulin NPH-regular Human (NOVOLIN 70/30) (70-30) 100 UNIT/ML injection Inject 50 Units into the skin 2 (two) times daily with a meal. Only if blood glucose is greater than 130 per patient    . Lancets (ONETOUCH DELICA PLUS [id number]) MISC     . levocetirizine (XYZAL) 5 MG tablet Take 5 mg by mouth at bedtime.    Marland Kitchen losartan (COZAAR) 25 MG tablet Take  1 tablet (25 mg total) by mouth daily. 30 tablet 10  . metFORMIN (GLUCOPHAGE-XR) 500 MG 24 hr tablet Take 500 mg by mouth daily.     . montelukast (SINGULAIR) 10 MG tablet Take 10 mg by mouth daily.     . norethindrone (AYGESTIN) 5 MG tablet Take 20 mg by mouth daily.     Glory Rosebush VERIO test strip     . pantoprazole (PROTONIX) 40 MG tablet Take 40 mg by mouth daily.    Marland Kitchen Propylene Glycol-Glycerin (SOOTHE) 0.6-0.6 % SOLN Place 2 drops into both eyes daily as needed (Dry eye). Sooth    . solifenacin (VESICARE) 10 MG tablet Take 10 mg by mouth daily.    Marland Kitchen topiramate (TOPAMAX) 100 MG tablet Take 150 mg by mouth every evening.     . vitamin B-12 (CYANOCOBALAMIN) 1000 MCG tablet Take 1,000 mcg by mouth daily.     No current facility-administered medications for this encounter.    Allergies  Allergen Reactions  . Levaquin [Levofloxacin] Hives and Shortness Of Breath    Pt was on levaquin & vancomycin at same time & doesn't know which one caused the reaction  . Vancomycin Hives and Shortness Of Breath    Pt was levaquin & vancomycin at the same & doesn't know which one caused the reaction.    Zyion Doxtater Bernard [Liraglutide] Nausea Only  . Tape Rash    Social History    Socioeconomic History  . Marital status: Single    Spouse name: Not on file  . Number of children: Not on file  . Years of education: Not on file  . Highest education level: Not on file  Occupational History  . Not on file  Tobacco Use  . Smoking status: Never Smoker  . Smokeless tobacco: Never Used  Vaping Use  . Vaping Use: Never used  Substance and Sexual Activity  . Alcohol use: Not Currently  . Drug use: No  . Sexual activity: Not on file  Other Topics Concern  . Not on file  Social History Narrative  . Not on file   Social Determinants of Health   Financial Resource Strain:   . Difficulty of Paying Living Expenses: Not on file  Food Insecurity:   . Worried About Charity fundraiser in the Last Year: Not on file  . Ran Out of Food in the Last Year: Not on file  Transportation Needs:   . Lack of Transportation (Medical): Not on file  . Lack of Transportation (Non-Medical): Not on file  Physical Activity:   . Days of Exercise per Week: Not on file  . Minutes of Exercise per Session: Not on file  Stress:   . Feeling of Stress : Not on file  Social Connections:   . Frequency of Communication with Friends and Family: Not on file  . Frequency of Social Gatherings with Friends and Family: Not on file  . Attends Religious Services: Not on file  . Active Member of Clubs or Organizations: Not on file  . Attends Archivist Meetings: Not on file  . Marital Status: Not on file  Intimate Partner Violence:   . Fear of Current or Ex-Partner: Not on file  . Emotionally Abused: Not on file  . Physically Abused: Not on file  . Sexually Abused: Not on file    Family History  Problem Relation Age of Onset  . Pneumonia Mother   . Atrial fibrillation Mother   . Emphysema Father  smoked  . Lung cancer Father   . Rheum arthritis Sister     ROS- All systems are reviewed and negative except as per the HPI above  Physical Exam: Vitals:   06/10/20 1431   BP: (!) 152/76  Pulse: 71  Weight: (!) 165.1 kg  Height: 5\' 7"  (1.702 m)   Wt Readings from Last 3 Encounters:  06/10/20 (!) 165.1 kg  05/09/20 (!) 163.3 kg  04/16/20 (!) 170.1 kg    Labs: Lab Results  Component Value Date   NA 139 04/10/2020   K 4.2 04/10/2020   CL 104 04/10/2020   CO2 21 (L) 04/10/2020   GLUCOSE 182 (H) 04/10/2020   BUN 27 (H) 04/10/2020   CREATININE 1.64 (H) 04/10/2020   CALCIUM 9.8 04/10/2020   MG 1.7 01/18/2020   No results found for: INR Lab Results  Component Value Date   CHOL 107 01/18/2020   HDL 20 (L) 01/18/2020   LDLCALC 65 01/18/2020   TRIG 112 01/18/2020     GEN- The patient is well appearing, alert and oriented x 3 today.   Head- normocephalic, atraumatic Eyes-  Sclera clear, conjunctiva pink Ears- hearing intact Oropharynx- clear Neck- supple, no JVP Lymph- no cervical lymphadenopathy Lungs- Clear to ausculation bilaterally, normal work of breathing Heart- Regular rate and rhythm, no murmurs, rubs or gallops, PMI not laterally displaced GI- soft, NT, ND, + BS Extremities- no clubbing, cyanosis, or edema MS- no significant deformity or atrophy Skin- no rash or lesion Psych- euthymic mood, full affect Neuro- strength and sensation are intact  EKG-NSR at 71 bpm, pr int 168 bpm, qrs int 76 ms qtc 413 ms   Echo- 4/21-FINDINGS  Left Ventricle: Left ventricular ejection fraction, by estimation, is 35  to 40%. The left ventricle has moderately decreased function. The left  ventricle demonstrates global hypokinesis. Definity contrast agent was  given IV to delineate the left  ventricular endocardial borders. There is moderate left ventricular  hypertrophy. Left ventricular diastolic function could not be evaluated  due to atrial fibrillation Echo- July 2021FINDINGS  Left Ventricle: Left ventricular ejection fraction, by estimation, is 55  to 60%. The left ventricle has normal function.   Right Ventricle: There is no diastilic  collapse of the RV or RA. The right  ventricular size is not well visualized. Right vetricular wall thickness  was not assessed. Right ventricular systolic function was not well  visualized.   Pericardium: A large pericardial effusion is present. The pericardial  effusion is circumferential.   Venous: The IVCs is dilated but there is collapse with inspiration. The  inferior vena cava is dilated in size with greater than 50% respiratory  variability, suggesting right atrial pressure of 8 mmHg.   Additional Comments: Technically difficult echo due to body habitus and  poor image quality. The LV function appears better than on previous echo.  The large pericardial effusion persists. There is no evidence of  pericardial tamponade.   7/21 Echo- . Left ventricular ejection fraction, by estimation, is 60 to 65%. The  left ventricle has normal function. The left ventricle has no regional  wall motion abnormalities. Left ventricular diastolic parameters are  consistent with Grade II diastolic  dysfunction (pseudonormalization). Elevated left ventricular end-diastolic  pressure.  2. Right ventricular systolic function is normal. The right ventricular  size is normal.  3. Left atrial size was mildly dilated.  4. The mitral valve is normal in structure. No evidence of mitral valve  regurgitation. No evidence of  mitral stenosis.  5. Normal trileaflet AV. No valve regurgitation is visualized. No aortic  stenosis is present. Aortic valve area, by VTI measures 3.53 cm. Aortic  valve mean gradient measures 7.0 mmHg. Aortic valve Vmax measures 1.96  m/s. DI is normal at 0.72.  6. The inferior vena cava is normal in size with greater than 50%  respiratory variability, suggesting right atrial pressure of 3 mmHg.   FINDINGS  Left Ventricle: Left ventricular ejection fraction, by estimation, is 60  to 65%. The left ventricle has normal function. The left ventricle has no  regional wall motion  abnormalities. The left ventricular internal cavity  size was normal in size. There is  no left ventricular hypertrophy. Left ventricular diastolic parameters  are consistent with Grade II diastolic dysfunction (pseudonormalization).  Elevated left ventricular end-diastolic pressure.   Assessment and Plan: 1. New onset afib in the setting of a urological procedure April 2021 Loaded on  amiodarone at that time  She has been maintaining  SR and kidney stone has been treated She is now off amiodarone x 2 weeks  Hopefully afib will remain  quiet, time will tell Will need a washout of amiodarone before other antiarrythmic's are started,  if needed, Tikosyn or flecainide, since EF has now normalized,  may be options   She has f/u with Dr. Radford Pax in November and HR can be further assessed at that time    2. CHA2DS2VASc score of 3(female, DM, LV dysfunction at time DOAC was started )  Continue eliquis 5 mg bid  Bleeding precautions discussed  Pt wishes to start back meloxicam, advised that this would be a bleeding risk with eliquis If she remains in SR, could consider stopping  eliquis, would probably need at least a 2 week Zio patch to confirm no afib or a Linq as pt did not feel the afib when she was in it  3. Pericardial effusion  No mention of effusion on last echo   F/u with Dr. Radford Pax as scheduled 11/2 afib clinic as needed   [place]. Samael Blades, [facility] [address] [place], [place] [address] [phone]

## 2020-06-12 ENCOUNTER — Other Ambulatory Visit: Payer: Self-pay | Admitting: Physician Assistant

## 2020-06-12 NOTE — Telephone Encounter (Signed)
Prescription refill request for Eliquis received.  Last office visit: Kayleen Memos 06/10/2020 Scr: 1.64, 03/31/2020 Age: 54 Weight: 165.1 kg   Prescription refill sent.

## 2020-08-07 ENCOUNTER — Telehealth: Payer: Self-pay

## 2020-08-07 NOTE — Telephone Encounter (Signed)
**Note De-Identified Kateline Kinkade Obfuscation** MYCHART message to pt: Mrs, Holly [name], This is just a re-cap of what you and I spoke about over the phone earlier this morning concerning your [facility] patient assistance application:  We are leaving you Eliquis samples in the front office at Dr Landis Gandy office along with a completed and signed provider page of a [employer] patient asst application. Because Dr Radford Pax is not in the office today and we want to handle this ASAP we asked Dr Johney Frame to sign it and she agreed.  Please pick up ASAP and add the provider page to your part of the [employer] Patient Assistance Application and all required documents per [employer] and mail all to [facility] as soon as you can so we can get this taken care of and you will get your Eliquis free of charge for the remainder of this year if approved.  If you have any questions please reach out by calling [phone] or through [facility]. Thank you, Holly Celeste "Jeani Hawking" Arnetha Silverthorne,LPN

## 2020-08-13 ENCOUNTER — Other Ambulatory Visit: Payer: Self-pay

## 2020-08-13 ENCOUNTER — Ambulatory Visit: Payer: Medicare Other | Admitting: Cardiology

## 2020-08-13 ENCOUNTER — Encounter: Payer: Self-pay | Admitting: Cardiology

## 2020-08-13 VITALS — BP 122/80 | HR 70 | Wt 378.0 lb

## 2020-08-13 DIAGNOSIS — I420 Dilated cardiomyopathy: Secondary | ICD-10-CM

## 2020-08-13 DIAGNOSIS — I313 Pericardial effusion (noninflammatory): Secondary | ICD-10-CM

## 2020-08-13 DIAGNOSIS — I3139 Other pericardial effusion (noninflammatory): Secondary | ICD-10-CM

## 2020-08-13 DIAGNOSIS — I480 Paroxysmal atrial fibrillation: Secondary | ICD-10-CM | POA: Diagnosis not present

## 2020-08-13 NOTE — Progress Notes (Signed)
Cardiology Office Note    Date:  08/13/2020   ID:  MIKELE SIFUENTES, DOB 02/22/1966, MRN [id number]  PCP:  Shirline Frees, MD  Cardiologist: Fransico Him, MD EPS: None  Chief Complaint  Patient presents with  . Follow-up    PAF, pericardial effusion, DCM    History of Present Illness:  Holly [name] is a 54 y.o. female with history of morbid obesity, OSA on CPAP, anxiety depression, DM.  On 01/16/2020 during left ureteroscopy and stent placement she went into atrial fib with RVR.  This did not improve with IV diltiazem so she was started on IV amiodarone and underwent TEE/DCCV and converted to normal sinus rhythm.  2D echo showed large pericardial effusion and was placed on colchicine.  Patient was seen in the A. fib clinic 01/30/2020 who recommended waiting 1 month after cardioversion before she undergoes any further urological procedures.  Plan was for amiodarone to be a bridge to other options to control her A. fib in the future.  CHA2DS2-VASc equals 5 on Eliquis.  Colchicine dose lowered because of interaction with amiodarone.  Patient was seen back by Estella Husk, PA and denied chest pain, shortness of breath. She was having a lot of pain since off of meloxicam while on colchicine and having to take hyrdocodone/voltaren/tylenol. She was in a wheel chair most of the time.  Repeat 2D echo last week showed normal LVF with resolution of pericardial effusion.    Seen back in afib clinic in August and had been off Amio for 2 weeks with no breakthrough PAF.  Recommendations were to consider Tikosyn or flecainide if she had reoccurrence of PAF.  She is here today for followup and is doing well.  She denies any chest pain or pressure, SOB, DOE, PND, orthopnea, dizziness, palpitations or syncope.  She has chronic LE edema which is stable.  She is compliant with her meds and is tolerating meds with no SE.    Past Medical History:  Diagnosis Date  . Anxiety   . Arthritis    lower back    . Asthma    was told she cough variant asthma  . Atrial fibrillation ([place]) 01/2020  . Cancer ([place]) 2016  . Carpal tunnel syndrome, bilateral   . Cluster headache   . Depression   . Diabetes mellitus without complication ([place])    type 2  . Endometrial polyp   . GERD (gastroesophageal reflux disease)   . Herniated disc, cervical   . History of hiatal hernia   . History of kidney stones   . Hypertension   . Morbid obesity ([place])   . Osteoarthritis of both knees   . Peripheral edema   . Pneumonia   . Shortness of breath dyspnea    on exertion  . Shoulder impingement    Bilateral  . Sleep apnea    CPAP    Past Surgical History:  Procedure Laterality Date  . ANKLE ARTHROSCOPY Right    bone spurs  . CARDIOVERSION N/A 01/19/2020   Procedure: CARDIOVERSION;  Surgeon: Fay Records, MD;  Location: [facility];  Service: Cardiovascular;  Laterality: N/A;  . CYSTOSCOPY WITH RETROGRADE PYELOGRAM, URETEROSCOPY AND STENT PLACEMENT Bilateral 03/26/2020   Procedure: CYSTOSCOPY WITH RETROGRADE PYELOGRAM, URETEROSCOPY AND STENT PLACEMENT;  Surgeon: Robley Fries, MD;  Location: [facility];  Service: Urology;  Laterality: Bilateral;  90 MINS  . CYSTOSCOPY WITH RETROGRADE PYELOGRAM, URETEROSCOPY AND STENT PLACEMENT Left 04/16/2020   Procedure: CYSTOSCOPY WITH RETROGRADE PYELOGRAM, URETEROSCOPY  AND STENT PLACEMENT;  Surgeon: Robley Fries, MD;  Location: [facility];  Service: Urology;  Laterality: Left;  90 MINS  . FOOT NEUROMA SURGERY Left   . HOLMIUM LASER APPLICATION Bilateral 5/62/1308   Procedure: HOLMIUM LASER APPLICATION;  Surgeon: Robley Fries, MD;  Location: [facility];  Service: Urology;  Laterality: Bilateral;  . HOLMIUM LASER APPLICATION Left 03/16/7845   Procedure: HOLMIUM LASER APPLICATION;  Surgeon: Robley Fries, MD;  Location: [facility];  Service: Urology;  Laterality: Left;  . HYSTEROSCOPY WITH D & C N/A 06/25/2015   Procedure: DILATATION AND CURETTAGE /HYSTEROSCOPY;  Surgeon:  Brien Few, MD;  Location: [facility];  Service: Gynecology;  Laterality: N/A;  . IRRIGATION AND DEBRIDEMENT KNEE Left    for staph infection x2  . KNEE ARTHROSCOPY Left   . TEAR DUCT PROBING Right   . TEE WITHOUT CARDIOVERSION N/A 01/19/2020   Procedure: TRANSESOPHAGEAL ECHOCARDIOGRAM (TEE);  Surgeon: Fay Records, MD;  Location: [facility];  Service: Cardiovascular;  Laterality: N/A;    Current Medications: Current Meds  Medication Sig  . Ascorbic Acid (VITAMIN C) 1000 MG tablet Take 1,000 mg by mouth in the morning and at bedtime.   Marland Kitchen atorvastatin (LIPITOR) 10 MG tablet Take 10 mg by mouth at bedtime.  . BD VEO INSULIN SYRINGE U/F 31G X 15/64" 1 ML MISC USE SYRINGE TO INJECT INSULIN 3 TIMES A DAY SUBCUTANEOUS FOR 30 DAYS  . calcium carbonate (OS-CAL) 600 MG TABS tablet Take 600 mg by mouth 2 (two) times daily with a meal.  . carvedilol (COREG) 3.125 MG tablet Take 1 tablet (3.125 mg total) by mouth 2 (two) times daily with a meal.  . Cholecalciferol (VITAMIN D) 50 MCG (2000 UT) tablet Take 2,000 Units by mouth in the morning and at bedtime.   . citalopram (CELEXA) 40 MG tablet Take 40 mg by mouth every evening.   . cyanocobalamin (,VITAMIN B-12,) 1000 MCG/ML injection Inject 1,000 mcg into the muscle every 30 (thirty) days.  Marland Kitchen ELIQUIS 5 MG TABS tablet TAKE 1 TABLET BY MOUTH TWICE A DAY  . ferrous sulfate (IRON SUPPLEMENT) 325 (65 FE) MG tablet Take 325 mg by mouth daily with breakfast.  . fluticasone (FLONASE) 50 MCG/ACT nasal spray Place 2 sprays into both nostrils daily as needed for allergies or rhinitis.   Marland Kitchen HYDROcodone-acetaminophen (NORCO) 10-325 MG tablet Take 1 tablet by mouth every 6 (six) hours as needed (Pain).   . insulin NPH-regular Human (NOVOLIN 70/30) (70-30) 100 UNIT/ML injection Inject 50 Units into the skin 2 (two) times daily with a meal. Only if blood glucose is greater than 130 per patient  . Lancets (ONETOUCH DELICA PLUS [id number]) MISC   . levocetirizine (XYZAL)  5 MG tablet Take 5 mg by mouth at bedtime.  Marland Kitchen losartan (COZAAR) 25 MG tablet Take 1 tablet (25 mg total) by mouth daily.  . metFORMIN (GLUCOPHAGE-XR) 500 MG 24 hr tablet Take 500 mg by mouth daily.   . montelukast (SINGULAIR) 10 MG tablet Take 10 mg by mouth daily.   . norethindrone (AYGESTIN) 5 MG tablet Take 20 mg by mouth daily.   Glory Rosebush VERIO test strip   . pantoprazole (PROTONIX) 40 MG tablet Take 40 mg by mouth daily.  Marland Kitchen Propylene Glycol-Glycerin (SOOTHE) 0.6-0.6 % SOLN Place 2 drops into both eyes daily as needed (Dry eye). Sooth  . solifenacin (VESICARE) 10 MG tablet Take 10 mg by mouth daily.  Marland Kitchen topiramate (TOPAMAX) 100 MG tablet Take 150  mg by mouth every evening.   . vitamin B-12 (CYANOCOBALAMIN) 1000 MCG tablet Take 1,000 mcg by mouth daily.     Allergies:   Levaquin [levofloxacin], Vancomycin, Victoza [liraglutide], and Tape   Social History   Socioeconomic History  . Marital status: Single    Spouse name: Not on file  . Number of children: Not on file  . Years of education: Not on file  . Highest education level: Not on file  Occupational History  . Not on file  Tobacco Use  . Smoking status: Never Smoker  . Smokeless tobacco: Never Used  Vaping Use  . Vaping Use: Never used  Substance and Sexual Activity  . Alcohol use: Not Currently  . Drug use: No  . Sexual activity: Not on file  Other Topics Concern  . Not on file  Social History Narrative  . Not on file   Social Determinants of Health   Financial Resource Strain:   . Difficulty of Paying Living Expenses: Not on file  Food Insecurity:   . Worried About Charity fundraiser in the Last Year: Not on file  . Ran Out of Food in the Last Year: Not on file  Transportation Needs:   . Lack of Transportation (Medical): Not on file  . Lack of Transportation (Non-Medical): Not on file  Physical Activity:   . Days of Exercise per Week: Not on file  . Minutes of Exercise per Session: Not on file  Stress:     . Feeling of Stress : Not on file  Social Connections:   . Frequency of Communication with Friends and Family: Not on file  . Frequency of Social Gatherings with Friends and Family: Not on file  . Attends Religious Services: Not on file  . Active Member of Clubs or Organizations: Not on file  . Attends Archivist Meetings: Not on file  . Marital Status: Not on file     Family History:  The patient's family history includes Atrial fibrillation in her mother; Emphysema in her father; Lung cancer in her father; Pneumonia in her mother; Rheum arthritis in her sister.   ROS:   Please see the history of present illness.    ROS All other systems reviewed and are negative.   PHYSICAL EXAM:   VS:  BP 122/80 (BP Location: Right Wrist, Patient Position: Sitting, Cuff Size: Normal)   Pulse 70   Wt (!) 378 lb (171.5 kg) Comment: Pt stated weight-couldnt stand to weigh  SpO2 98%   BMI 59.20 kg/m   Physical Exam  GEN: Well nourished, well developed in no acute distress HEENT: Normal NECK: No JVD; No carotid bruits LYMPHATICS: No lymphadenopathy CARDIAC:RRR, no murmurs, rubs, gallops RESPIRATORY:  Clear to auscultation without rales, wheezing or rhonchi  ABDOMEN: Soft, non-tender, non-distended MUSCULOSKELETAL:  No edema; No deformity  SKIN: Warm and dry NEUROLOGIC:  Alert and oriented x 3 PSYCHIATRIC:  Normal affect    Wt Readings from Last 3 Encounters:  08/13/20 (!) 378 lb (171.5 kg)  06/10/20 (!) 364 lb (165.1 kg)  05/09/20 (!) 360 lb (163.3 kg)      Studies/Labs Reviewed:   EKG:  EKG is not ordered today.    Recent Labs: 01/16/2020: TSH 2.116 01/18/2020: Magnesium 1.7 04/10/2020: BUN 27; Creatinine, Ser 1.64; Platelets 300; Potassium 4.2; Sodium 139 04/16/2020: Hemoglobin 9.4   Lipid Panel    Component Value Date/Time   CHOL 107 01/18/2020 0250   TRIG 112 01/18/2020 0250   HDL  20 (L) 01/18/2020 0250   CHOLHDL 5.4 01/18/2020 0250   VLDL 22 01/18/2020 0250    LDLCALC 65 01/18/2020 0250    Additional studies/ records that were reviewed today include:  Limited echo 01/20/2020 IMPRESSIONS     1. Technically difficult echo due to body habitus and poor image quality.  The LV function appears better than on previous echo. The large  pericardial effusion persists. There is no evidence of pericardial  tamponade.   2. Left ventricular ejection fraction, by estimation, is 55 to 60%. The  left ventricle has normal function.   3. Right ventricular systolic function was not well visualized. The right  ventricular size is not well visualized.   4. Large pericardial effusion. The pericardial effusion is  circumferential.   5. The IVCs is dilated but there is collapse with inspiration . The  inferior vena cava is dilated in size with >50% respiratory variability,  suggesting right atrial pressure of 8 mmHg.   TEE 01/19/2020 IMPRESSIONS     1. Left ventricular ejection fraction, by estimation, is 35 to 40%. The  left ventricle has moderately decreased function. The left ventricle  demonstrates global hypokinesis.   2. Right ventricular systolic function is normal. The right ventricular  size is normal.   3. No left atrial/left atrial appendage thrombus was detected.   4. The mitral valve is normal in structure. Moderate mitral valve  regurgitation.   5. The aortic valve is normal in structure. Aortic valve regurgitation is  mild.   2D echo 05/02/2020 IMPRESSIONS    1. Left ventricular ejection fraction, by estimation, is 60 to 65%. The  left ventricle has normal function. The left ventricle has no regional  wall motion abnormalities. Left ventricular diastolic parameters are  consistent with Grade II diastolic  dysfunction (pseudonormalization). Elevated left ventricular end-diastolic  pressure.  2. Right ventricular systolic function is normal. The right ventricular  size is normal.  3. Left atrial size was mildly dilated.  4. The mitral  valve is normal in structure. No evidence of mitral valve  regurgitation. No evidence of mitral stenosis.  5. Normal trileaflet AV. No valve regurgitation is visualized. No aortic  stenosis is present. Aortic valve area, by VTI measures 3.53 cm. Aortic  valve mean gradient measures 7.0 mmHg. Aortic valve Vmax measures 1.96  m/s. DI is normal at 0.72.  6. The inferior vena cava is normal in size with greater than 50%  respiratory variability, suggesting right atrial pressure of 3 mmHg.   FINDINGS    Limited TEE 01/17/2020 IMPRESSIONS     1. RV cavity appears small, no clear diastolic collapse. Right  ventricular systolic function is normal. The right ventricular size is  normal.   2. Promient apical fat pad is noted. moderate to large circumferential  pericardial effusion (measuring between 1.8-2.4 cm).   3. No clear significant respiratory inflow variation.   4. Borderline tricuspid inflow variation with respiration.   Comparison(s): Prior images reviewed side by side. 01/17/20: LVEF 35-40%,  large pericardial effusion, dilated IVC.   Conclusion(s)/Recommendation(s): Comparing both sets of images, there is a  moderate to large (>2.0 cm) circumferential pericardial effusion, as well  as a promient apical fat pad. The IVC is diltated and there is minimal  tricuspid, but no significant  mitral inflow variation with respiration. The RV appears normal diastolic  collapse is not noted. Tamponade physiology is difficult to ascertain in  the setting of cardiomyopathy, but not suspected. Vitals indicate  significant hypertension,  which does not  suggest tamponade. Clinical correlation is always recommended.   2D echo 05/02/2020 IMPRESSIONS   1. Left ventricular ejection fraction, by estimation, is 60 to 65%. The  left ventricle has normal function. The left ventricle has no regional  wall motion abnormalities. Left ventricular diastolic parameters are  consistent with Grade II  diastolic  dysfunction (pseudonormalization). Elevated left ventricular end-diastolic  pressure.  2. Right ventricular systolic function is normal. The right ventricular  size is normal.  3. Left atrial size was mildly dilated.  4. The mitral valve is normal in structure. No evidence of mitral valve  regurgitation. No evidence of mitral stenosis.  5. Normal trileaflet AV. No valve regurgitation is visualized. No aortic  stenosis is present. Aortic valve area, by VTI measures 3.53 cm. Aortic  valve mean gradient measures 7.0 mmHg. Aortic valve Vmax measures 1.96  m/s. DI is normal at 0.72.  6. The inferior vena cava is normal in size with greater than 50%  respiratory variability, suggesting right atrial pressure of 3 mmHg.   ASSESSMENT:   1.   1. PAF (paroxysmal atrial fibrillation) ([place])   2. Pericardial effusion   3. DCM (dilated cardiomyopathy) ([place])      PLAN:  In order of problems listed above:  1.  Paroxysmal atrial fibrillation -occurred in the setting of acute anxiety the am of her urological procedure when placed on table to induce anesthesia -converted to normal sinus rhythm with TEE DCCV  -she has been on Amio and remains on Eliquis followed in afib clinic with no reoccurrence of PAF -if no reoccurence of her PAF then continue just on BB.  If she has a reoccurence then will consider Tikosyn or Flecainide -continue Carvedilol 3.125mg  BID   CHA2DS2-VASc Score = 2  This indicates a 2.2% annual risk of stroke. The patient's score is based upon: CHF History: 0 HTN History: 0 Diabetes History: 1 Stroke History: 0 Vascular Disease History: 0 Age Score: 0 Gender Score: 1  -she is concerned because she needs to take meloxicam for pain but I told her that she needs to remain on anticoagulation and should not be taking Meloxicam daily  2.  Pericardial effusion  -this has resolved by last echo this summer  3.  Cardiomyopathy  -likely tachy related from afib with  RVR -EF 35 to 40% on initial echo and TEE .   -follow-up echo 01/20/2020 normal LVEF 60 to 65%    Medication Adjustments/Labs and Tests Ordered: Current medicines are reviewed at length with the patient today.  Concerns regarding medicines are outlined above.  Medication changes, Labs and Tests ordered today are listed in the Patient Instructions below. There are no Patient Instructions on file for this visit.   Signed, Fransico Him, MD  08/13/2020 2:44 PM    [address], [place], [place]  [address] Phone: [phone]; Fax: [phone]

## 2020-08-13 NOTE — Patient Instructions (Signed)

## 2020-08-16 ENCOUNTER — Other Ambulatory Visit: Payer: Self-pay | Admitting: Cardiology

## 2020-10-15 DIAGNOSIS — M654 Radial styloid tenosynovitis [de Quervain]: Secondary | ICD-10-CM | POA: Diagnosis not present

## 2020-10-15 DIAGNOSIS — M79641 Pain in right hand: Secondary | ICD-10-CM | POA: Diagnosis not present

## 2020-10-24 ENCOUNTER — Telehealth: Payer: Self-pay

## 2020-10-24 NOTE — Telephone Encounter (Signed)
Paper has been mailed to the patient.

## 2020-10-24 NOTE — Telephone Encounter (Signed)
The pts left the MD page of a BMSPAF application at the office with a request that we mail it to her home address once completed and Dr Radford Pax has signed it.  I have completed the MD page, Dr Radford Pax has already signed it,  and I emailed it to Dr Landis Gandy nurse so she can mail back to the pts address at : [address]  [employer] [place] [id number]

## 2020-11-04 DIAGNOSIS — E113293 Type 2 diabetes mellitus with mild nonproliferative diabetic retinopathy without macular edema, bilateral: Secondary | ICD-10-CM | POA: Diagnosis not present

## 2020-11-04 DIAGNOSIS — N1832 Chronic kidney disease, stage 3b: Secondary | ICD-10-CM | POA: Diagnosis not present

## 2020-11-04 DIAGNOSIS — E1122 Type 2 diabetes mellitus with diabetic chronic kidney disease: Secondary | ICD-10-CM | POA: Diagnosis not present

## 2020-11-04 DIAGNOSIS — Z794 Long term (current) use of insulin: Secondary | ICD-10-CM | POA: Diagnosis not present

## 2020-11-06 DIAGNOSIS — G4733 Obstructive sleep apnea (adult) (pediatric): Secondary | ICD-10-CM | POA: Diagnosis not present

## 2020-11-06 DIAGNOSIS — G44009 Cluster headache syndrome, unspecified, not intractable: Secondary | ICD-10-CM | POA: Diagnosis not present

## 2020-11-06 DIAGNOSIS — K219 Gastro-esophageal reflux disease without esophagitis: Secondary | ICD-10-CM | POA: Diagnosis not present

## 2020-11-06 DIAGNOSIS — J029 Acute pharyngitis, unspecified: Secondary | ICD-10-CM | POA: Diagnosis not present

## 2020-11-06 DIAGNOSIS — E7800 Pure hypercholesterolemia, unspecified: Secondary | ICD-10-CM | POA: Diagnosis not present

## 2020-11-06 DIAGNOSIS — E1122 Type 2 diabetes mellitus with diabetic chronic kidney disease: Secondary | ICD-10-CM | POA: Diagnosis not present

## 2020-11-06 DIAGNOSIS — J309 Allergic rhinitis, unspecified: Secondary | ICD-10-CM | POA: Diagnosis not present

## 2020-11-06 DIAGNOSIS — I872 Venous insufficiency (chronic) (peripheral): Secondary | ICD-10-CM | POA: Diagnosis not present

## 2020-11-06 DIAGNOSIS — I4891 Unspecified atrial fibrillation: Secondary | ICD-10-CM | POA: Diagnosis not present

## 2020-11-06 DIAGNOSIS — N1832 Chronic kidney disease, stage 3b: Secondary | ICD-10-CM | POA: Diagnosis not present

## 2020-11-21 DIAGNOSIS — N200 Calculus of kidney: Secondary | ICD-10-CM | POA: Diagnosis not present

## 2020-11-21 DIAGNOSIS — N183 Chronic kidney disease, stage 3 unspecified: Secondary | ICD-10-CM | POA: Diagnosis not present

## 2020-11-21 DIAGNOSIS — E1122 Type 2 diabetes mellitus with diabetic chronic kidney disease: Secondary | ICD-10-CM | POA: Diagnosis not present

## 2020-11-21 DIAGNOSIS — R809 Proteinuria, unspecified: Secondary | ICD-10-CM | POA: Diagnosis not present

## 2020-11-21 DIAGNOSIS — I129 Hypertensive chronic kidney disease with stage 1 through stage 4 chronic kidney disease, or unspecified chronic kidney disease: Secondary | ICD-10-CM | POA: Diagnosis not present

## 2020-11-21 DIAGNOSIS — N1832 Chronic kidney disease, stage 3b: Secondary | ICD-10-CM | POA: Diagnosis not present

## 2020-11-28 NOTE — Telephone Encounter (Signed)
**Note De-Identified Holly [name] Obfuscation** Letter received Holly [name] fax from [facility] stating that they have approved the pt for asst with her Eliquis until 10/11/2021. BMSPAF Case #: [id number]  The letter states that they have notified the pt of this approval as well.

## 2020-12-26 IMAGING — CT CT CHEST W/ CM
2 of 4 series · 15 of 36 positions shown, 18 images · IV contrast (omnipaque)
Comparison: CT abdomen and pelvis from January 18, 2020

CLINICAL DATA: Pericardial effusion.

EXAM:
CT CHEST WITH CONTRAST
TECHNIQUE: Multidetector CT imaging of the chest was performed during
intravenous contrast administration.
CONTRAST:  80mL OMNIPAQUE IOHEXOL 300 MG/ML  SOLN

[Series 2: thorax · axial · 0.77mm/px · z∈[-398,-94]mm · 12 of 180 slices shown, 15 images]
[im 14/180  mediastinal]
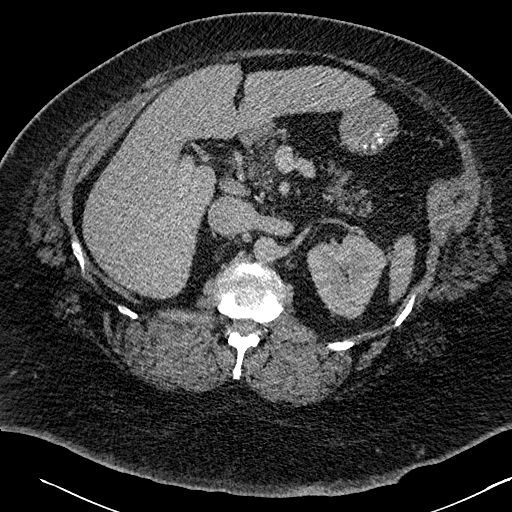
[im 14/180  lung]
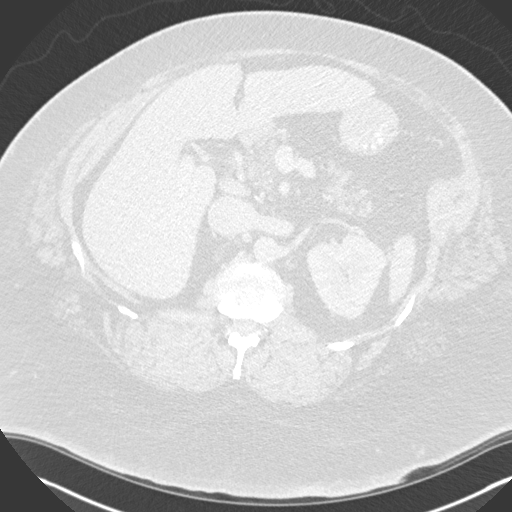
[im 28/180  lung]
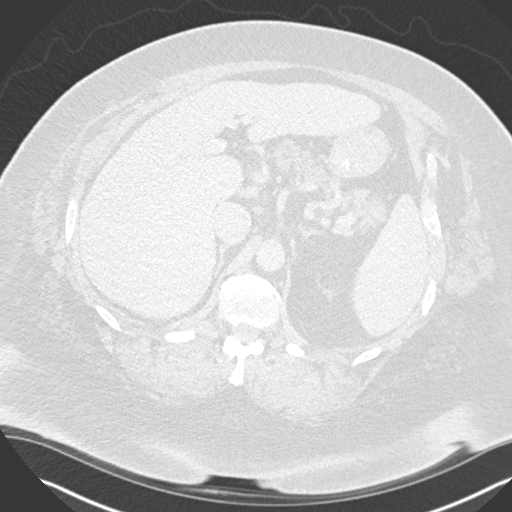
[im 42/180  lung]
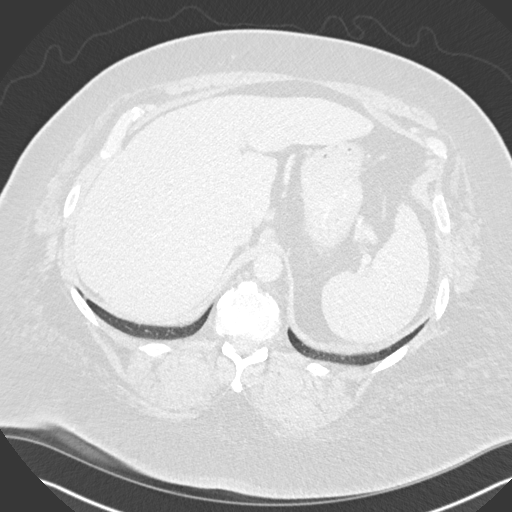
[im 56/180  lung]
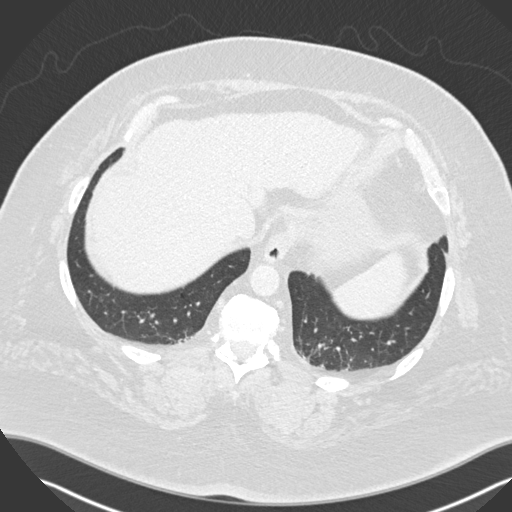
[im 69/180  mediastinal]
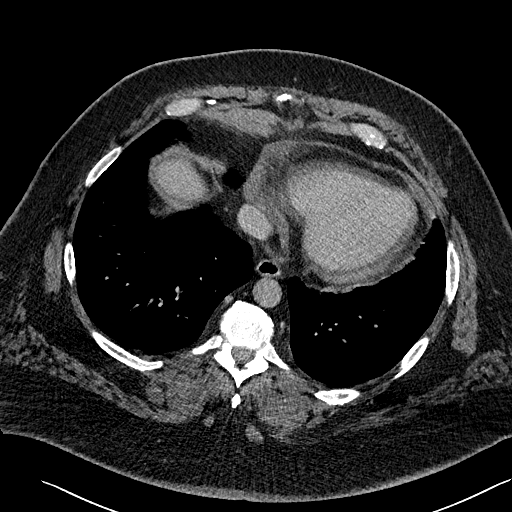
[im 69/180  lung]
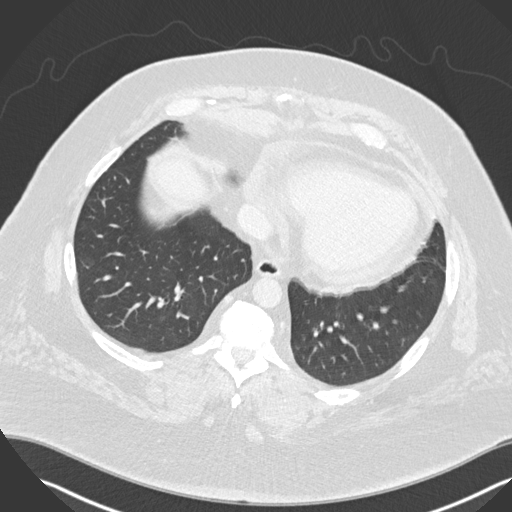
[im 83/180  lung]
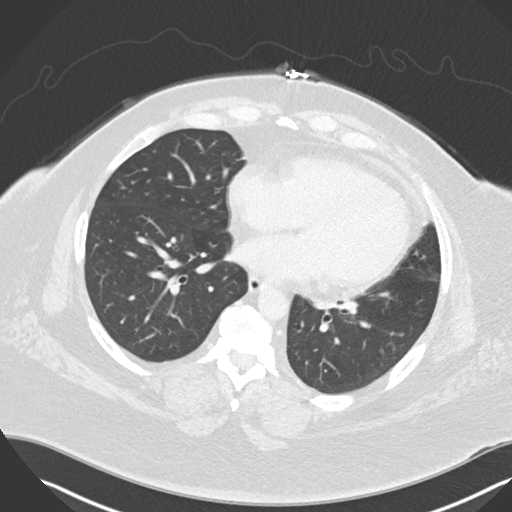
[im 97/180  lung]
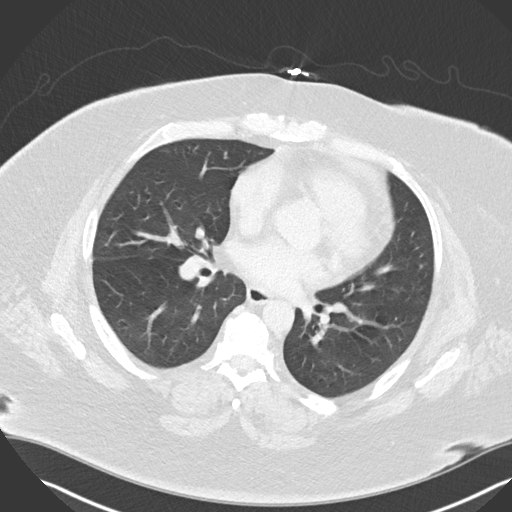
[im 111/180  lung]
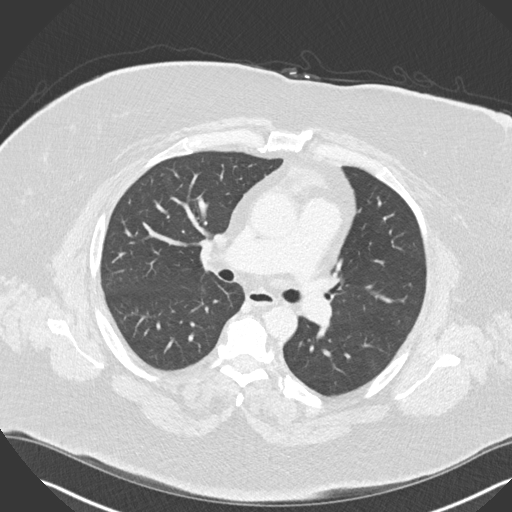
[im 124/180  mediastinal]
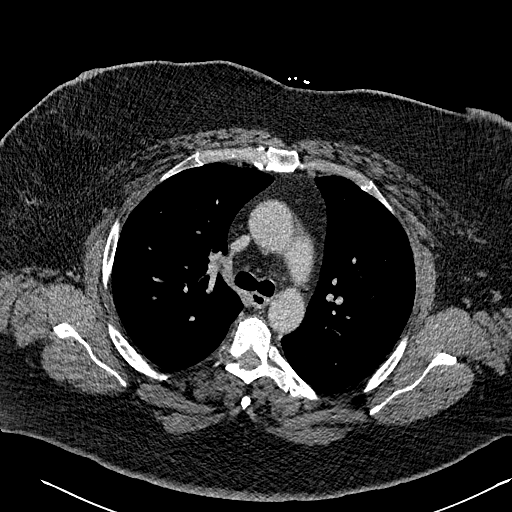
[im 124/180  lung]
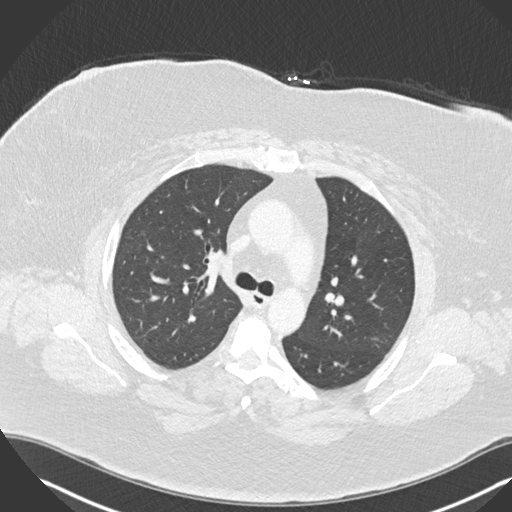
[im 138/180  lung]
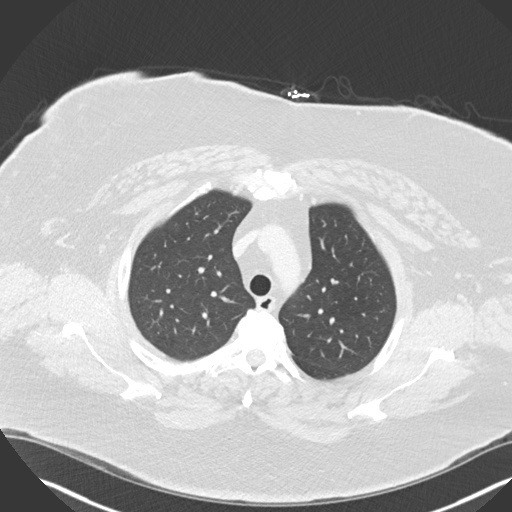
[im 152/180  lung]
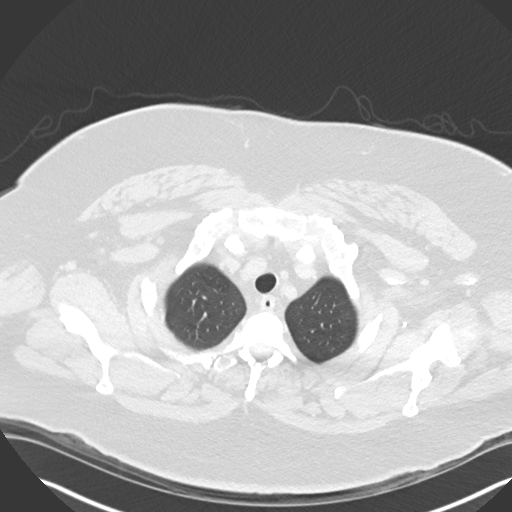
[im 166/180  lung]
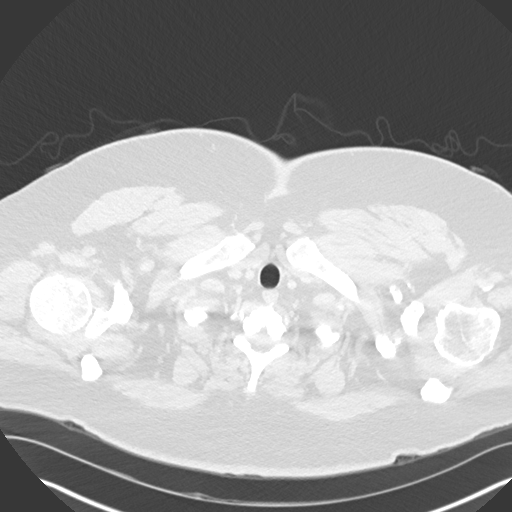

[Series 5: coronal · coronal · 0.70mm/px · 3 of 149 slices shown]
[im 30/149  lung]
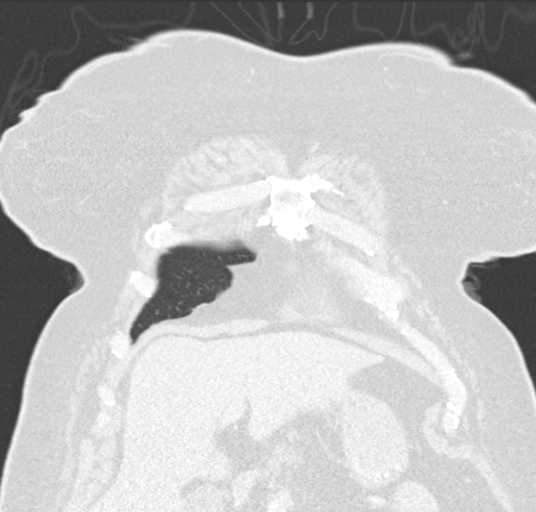
[im 60/149  lung]
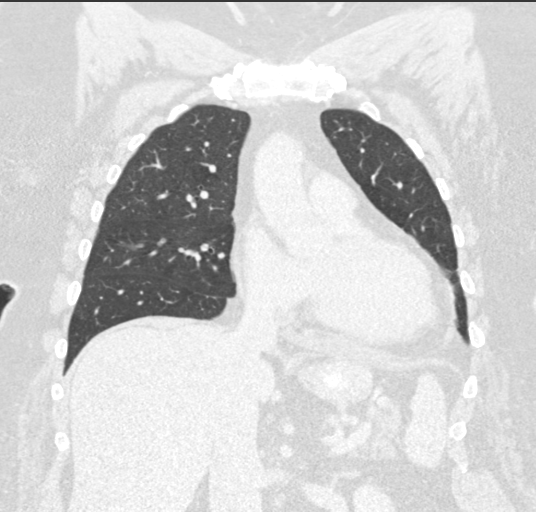
[im 89/149  lung]
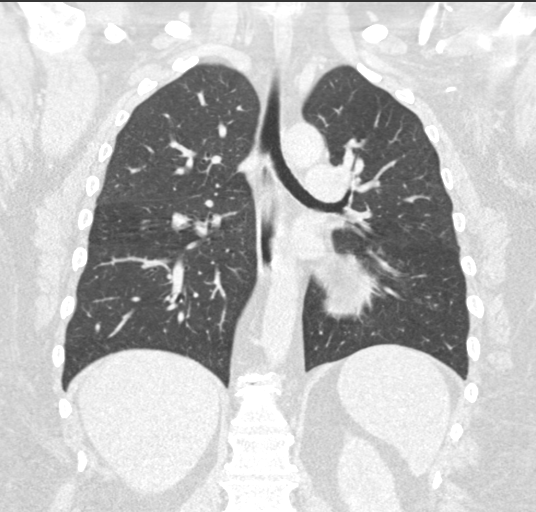

[15 of 36 positions shown; findings below may reference images not displayed]

FINDINGS: Cardiovascular: Diminished pericardial fluid since the previous
examination. Now with small pericardial effusion. Depth along the
posterior heart border approximately 4 mm as compared to 19 mm. No
sign of aneurysmal dilation of the thoracic aorta. Minimal calcified
atherosclerotic changes of the thoracic aorta. Heart size is normal.
Central pulmonary vasculature unremarkable on venous phase
assessment.

Mediastinum/Nodes: Thoracic inlet without signs of adenopathy. No
axillary lymphadenopathy. No mediastinal or hilar lymphadenopathy.
Esophagus is normal.

Lungs/Pleura: Branching reticular opacities at the LEFT greater than
RIGHT lung base with associated calcification unchanged from
previous exam.

Upper Abdomen: Nodular hepatic contours. Mild periportal lymph node
enlargement. The calculus in the upper pole the LEFT kidney
partially imaged, in similar position when compared to the prior
study. The calculus in the renal pelvis on the previous exam is not
seen though this areas not imaged in its entirety.

Musculoskeletal: No acute musculoskeletal process or destructive
bone finding.
IMPRESSION: 1. Diminished pericardial fluid since the previous examination. Now
with small pericardial effusion.
2. Branching reticular opacities at the LEFT greater than RIGHT lung
base with associated calcification unchanged from previous exam.
Findings may represent sequela of prior infection or inflammation.
3. Nodular hepatic contours raising the question of cirrhosis/liver
disease. Correlate with any clinical or laboratory evidence of
hepatic dysfunction.
4. Calculus in the upper pole the LEFT kidney partially imaged, in
similar position when compared to the prior study.
5. Aortic atherosclerosis.

Aortic Atherosclerosis (323ME-ZSD.D).

## 2020-12-27 DIAGNOSIS — N200 Calculus of kidney: Secondary | ICD-10-CM | POA: Diagnosis not present

## 2021-02-07 DIAGNOSIS — E113293 Type 2 diabetes mellitus with mild nonproliferative diabetic retinopathy without macular edema, bilateral: Secondary | ICD-10-CM | POA: Diagnosis not present

## 2021-02-07 DIAGNOSIS — N1832 Chronic kidney disease, stage 3b: Secondary | ICD-10-CM | POA: Diagnosis not present

## 2021-02-07 DIAGNOSIS — E1122 Type 2 diabetes mellitus with diabetic chronic kidney disease: Secondary | ICD-10-CM | POA: Diagnosis not present

## 2021-02-07 DIAGNOSIS — Z794 Long term (current) use of insulin: Secondary | ICD-10-CM | POA: Diagnosis not present

## 2021-02-10 DIAGNOSIS — E7800 Pure hypercholesterolemia, unspecified: Secondary | ICD-10-CM | POA: Diagnosis not present

## 2021-02-10 DIAGNOSIS — E1122 Type 2 diabetes mellitus with diabetic chronic kidney disease: Secondary | ICD-10-CM | POA: Diagnosis not present

## 2021-02-10 DIAGNOSIS — G43009 Migraine without aura, not intractable, without status migrainosus: Secondary | ICD-10-CM | POA: Diagnosis not present

## 2021-02-10 DIAGNOSIS — G250 Essential tremor: Secondary | ICD-10-CM | POA: Diagnosis not present

## 2021-02-10 DIAGNOSIS — N1832 Chronic kidney disease, stage 3b: Secondary | ICD-10-CM | POA: Diagnosis not present

## 2021-02-10 DIAGNOSIS — R300 Dysuria: Secondary | ICD-10-CM | POA: Diagnosis not present

## 2021-02-10 DIAGNOSIS — L299 Pruritus, unspecified: Secondary | ICD-10-CM | POA: Diagnosis not present

## 2021-02-10 DIAGNOSIS — I4891 Unspecified atrial fibrillation: Secondary | ICD-10-CM | POA: Diagnosis not present

## 2021-02-17 ENCOUNTER — Ambulatory Visit: Payer: Medicare Other | Admitting: Cardiology

## 2021-04-01 ENCOUNTER — Other Ambulatory Visit: Payer: Self-pay | Admitting: Cardiology

## 2021-04-18 ENCOUNTER — Ambulatory Visit: Payer: Medicare Other | Admitting: Cardiology

## 2021-04-22 DIAGNOSIS — E1122 Type 2 diabetes mellitus with diabetic chronic kidney disease: Secondary | ICD-10-CM | POA: Diagnosis not present

## 2021-04-22 DIAGNOSIS — E113293 Type 2 diabetes mellitus with mild nonproliferative diabetic retinopathy without macular edema, bilateral: Secondary | ICD-10-CM | POA: Diagnosis not present

## 2021-04-22 DIAGNOSIS — N1832 Chronic kidney disease, stage 3b: Secondary | ICD-10-CM | POA: Diagnosis not present

## 2021-04-22 DIAGNOSIS — Z794 Long term (current) use of insulin: Secondary | ICD-10-CM | POA: Diagnosis not present

## 2021-05-26 ENCOUNTER — Ambulatory Visit: Payer: Medicare Other | Admitting: Cardiology

## 2021-06-04 DIAGNOSIS — N200 Calculus of kidney: Secondary | ICD-10-CM | POA: Diagnosis not present

## 2021-06-04 DIAGNOSIS — E785 Hyperlipidemia, unspecified: Secondary | ICD-10-CM | POA: Diagnosis not present

## 2021-06-04 DIAGNOSIS — N390 Urinary tract infection, site not specified: Secondary | ICD-10-CM | POA: Diagnosis not present

## 2021-06-04 DIAGNOSIS — I129 Hypertensive chronic kidney disease with stage 1 through stage 4 chronic kidney disease, or unspecified chronic kidney disease: Secondary | ICD-10-CM | POA: Diagnosis not present

## 2021-06-04 DIAGNOSIS — N183 Chronic kidney disease, stage 3 unspecified: Secondary | ICD-10-CM | POA: Diagnosis not present

## 2021-06-04 DIAGNOSIS — E1122 Type 2 diabetes mellitus with diabetic chronic kidney disease: Secondary | ICD-10-CM | POA: Diagnosis not present

## 2021-06-04 DIAGNOSIS — N2581 Secondary hyperparathyroidism of renal origin: Secondary | ICD-10-CM | POA: Diagnosis not present

## 2021-06-04 DIAGNOSIS — D631 Anemia in chronic kidney disease: Secondary | ICD-10-CM | POA: Diagnosis not present

## 2021-06-28 ENCOUNTER — Other Ambulatory Visit: Payer: Self-pay | Admitting: Cardiology

## 2021-07-14 DIAGNOSIS — N183 Chronic kidney disease, stage 3 unspecified: Secondary | ICD-10-CM | POA: Diagnosis not present

## 2021-07-14 DIAGNOSIS — Z23 Encounter for immunization: Secondary | ICD-10-CM | POA: Diagnosis not present

## 2021-07-14 DIAGNOSIS — E1165 Type 2 diabetes mellitus with hyperglycemia: Secondary | ICD-10-CM | POA: Diagnosis not present

## 2021-07-14 DIAGNOSIS — G43009 Migraine without aura, not intractable, without status migrainosus: Secondary | ICD-10-CM | POA: Diagnosis not present

## 2021-07-14 DIAGNOSIS — E7800 Pure hypercholesterolemia, unspecified: Secondary | ICD-10-CM | POA: Diagnosis not present

## 2021-07-14 DIAGNOSIS — G4733 Obstructive sleep apnea (adult) (pediatric): Secondary | ICD-10-CM | POA: Diagnosis not present

## 2021-07-14 DIAGNOSIS — J40 Bronchitis, not specified as acute or chronic: Secondary | ICD-10-CM | POA: Diagnosis not present

## 2021-07-14 DIAGNOSIS — K219 Gastro-esophageal reflux disease without esophagitis: Secondary | ICD-10-CM | POA: Diagnosis not present

## 2021-07-14 DIAGNOSIS — M170 Bilateral primary osteoarthritis of knee: Secondary | ICD-10-CM | POA: Diagnosis not present

## 2021-08-11 DIAGNOSIS — E538 Deficiency of other specified B group vitamins: Secondary | ICD-10-CM | POA: Diagnosis not present

## 2021-08-11 DIAGNOSIS — E1165 Type 2 diabetes mellitus with hyperglycemia: Secondary | ICD-10-CM | POA: Diagnosis not present

## 2021-08-11 DIAGNOSIS — Z5181 Encounter for therapeutic drug level monitoring: Secondary | ICD-10-CM | POA: Diagnosis not present

## 2021-08-11 DIAGNOSIS — Z794 Long term (current) use of insulin: Secondary | ICD-10-CM | POA: Diagnosis not present

## 2021-08-11 DIAGNOSIS — E11319 Type 2 diabetes mellitus with unspecified diabetic retinopathy without macular edema: Secondary | ICD-10-CM | POA: Diagnosis not present

## 2021-09-11 ENCOUNTER — Telehealth (INDEPENDENT_AMBULATORY_CARE_PROVIDER_SITE_OTHER): Payer: Medicare Other | Admitting: Cardiology

## 2021-09-11 ENCOUNTER — Encounter: Payer: Self-pay | Admitting: Cardiology

## 2021-09-11 ENCOUNTER — Telehealth: Payer: Self-pay | Admitting: *Deleted

## 2021-09-11 ENCOUNTER — Other Ambulatory Visit: Payer: Self-pay

## 2021-09-11 VITALS — BP 122/71 | HR 70 | Temp 98.8°F | Ht 67.0 in | Wt 392.2 lb

## 2021-09-11 DIAGNOSIS — I480 Paroxysmal atrial fibrillation: Secondary | ICD-10-CM

## 2021-09-11 DIAGNOSIS — I3139 Other pericardial effusion (noninflammatory): Secondary | ICD-10-CM | POA: Diagnosis not present

## 2021-09-11 DIAGNOSIS — I420 Dilated cardiomyopathy: Secondary | ICD-10-CM

## 2021-09-11 DIAGNOSIS — Z9989 Dependence on other enabling machines and devices: Secondary | ICD-10-CM | POA: Diagnosis not present

## 2021-09-11 DIAGNOSIS — G4733 Obstructive sleep apnea (adult) (pediatric): Secondary | ICD-10-CM | POA: Diagnosis not present

## 2021-09-11 MED ORDER — CARVEDILOL 3.125 MG PO TABS: 3.1250 mg | ORAL_TABLET | Freq: Two times a day (BID) | ORAL | 3 refills | Status: DC

## 2021-09-11 MED ORDER — APIXABAN 5 MG PO TABS: 5.0000 mg | ORAL_TABLET | Freq: Two times a day (BID) | ORAL | 11 refills | Status: DC

## 2021-09-11 NOTE — Addendum Note (Signed)
Addended by: Jacinta Shoe on: 09/11/2021 01:16 PM   Modules accepted: Orders

## 2021-09-11 NOTE — Progress Notes (Signed)
Virtual Visit via Video Note   This visit type was conducted due to national recommendations for restrictions regarding the COVID-19 Pandemic (e.g. social distancing) in an effort to limit this patient's exposure and mitigate transmission in our community.  Due to her co-morbid illnesses, this patient is at least at moderate risk for complications without adequate follow up.  This format is felt to be most appropriate for this patient at this time.  All issues noted in this document were discussed and addressed.  A limited physical exam was performed with this format.  Please refer to the patient's chart for her consent to telehealth for [facility].   Date:  09/11/2021   ID:  Holly [name], DOB [birth year], MRN [id number] The patient was identified using 2 identifiers.  Patient Location: Home Provider Location: Office/Clinic   PCP:  Shirline Frees, MD   [facility] Providers Cardiologist:  Fransico Him, MD     Evaluation Performed:  Follow-Up Visit  Chief Complaint:  OSA  History of Present Illness:    Holly [name] is a 55 y.o. female with with history of morbid obesity, OSA on CPAP, anxiety depression, DM.  On 01/16/2020 during left ureteroscopy and stent placement she went into atrial fib with RVR.  This did not improve with IV diltiazem so she was started on IV amiodarone and underwent TEE/DCCV and converted to normal sinus rhythm.  2D echo showed large pericardial effusion and was placed on colchicine.   Patient was seen in the A. fib clinic 01/30/2020 who recommended waiting 1 month after cardioversion before she undergoes any further urological procedures.  Plan was for amiodarone to be a bridge to other options to control her A. fib in the future.  CHA2DS2-VASc equals 5 on Eliquis.  Colchicine dose lowered because of interaction with amiodarone.  Repeat 2D echocardiogram showed resolution of her pericardial effusion.  She is here today for followup and is doing well.   She denies any chest pain or pressure, SOB, DOE, PND, orthopnea, dizziness, palpitations or syncope. She has  chronic LE edema which is stable.  She is compliant with her meds and is tolerating meds with no SE.     She is doing well with her CPAP device and thinks that she has gotten used to it.  She tolerates the mask and feels the pressure is adequate.  She tells me that she feels fatigued daily. She gets up 3 times nightly to use the bathroom.   She has a lot of mouth dryness.  She uses a nasal mask but no chin strap.   She denies any nasal dryness or nasal congestion.  She does not think that he snores.     The patient does not have symptoms concerning for COVID-19 infection (fever, chills, cough, or new shortness of breath).    Past Medical History:  Diagnosis Date   Anxiety    Arthritis    lower back   Asthma    was told she cough variant asthma   Atrial fibrillation ([place]) 01/2020   Cancer ([place]) 2016   Carpal tunnel syndrome, bilateral    Cluster headache    Depression    Diabetes mellitus without complication (HCC)    type 2   Endometrial polyp    GERD (gastroesophageal reflux disease)    Herniated disc, cervical    History of hiatal hernia    History of kidney stones    Hypertension    Morbid obesity ([place])    Osteoarthritis  of both knees    Peripheral edema    Pneumonia    Shortness of breath dyspnea    on exertion   Shoulder impingement    Bilateral   Sleep apnea    CPAP   Past Surgical History:  Procedure Laterality Date   ANKLE ARTHROSCOPY Right    bone spurs   CARDIOVERSION N/A 01/19/2020   Procedure: CARDIOVERSION;  Surgeon: Fay Records, MD;  Location: [facility] ENDOSCOPY;  Service: Cardiovascular;  Laterality: N/A;   CYSTOSCOPY WITH RETROGRADE PYELOGRAM, URETEROSCOPY AND STENT PLACEMENT Bilateral 03/26/2020   Procedure: CYSTOSCOPY WITH RETROGRADE PYELOGRAM, URETEROSCOPY AND STENT PLACEMENT;  Surgeon: Robley Fries, MD;  Location: [facility];  Service: Urology;   Laterality: Bilateral;  90 MINS   CYSTOSCOPY WITH RETROGRADE PYELOGRAM, URETEROSCOPY AND STENT PLACEMENT Left 04/16/2020   Procedure: CYSTOSCOPY WITH RETROGRADE PYELOGRAM, URETEROSCOPY AND STENT PLACEMENT;  Surgeon: Robley Fries, MD;  [facility];  Service: Urology;  Laterality: Left;  90 MINS   FOOT NEUROMA SURGERY Left    HOLMIUM LASER APPLICATION Bilateral 9/83/3825   Procedure: HOLMIUM LASER APPLICATION;  Surgeon: Robley Fries, MD;  [facility];  Service: Urology;  Laterality: Bilateral;   HOLMIUM LASER APPLICATION Left 0/02/3975   Procedure: HOLMIUM LASER APPLICATION;  Surgeon: Robley Fries, MD;  [facility];  Service: Urology;  Laterality: Left;   HYSTEROSCOPY WITH D & C N/A 06/25/2015   Procedure: DILATATION AND CURETTAGE /HYSTEROSCOPY;  Surgeon: Brien Few, MD;  Location: [facility];  Service: Gynecology;  Laterality: N/A;   IRRIGATION AND DEBRIDEMENT KNEE Left    for staph infection x2   KNEE ARTHROSCOPY Left    TEAR DUCT PROBING Right    TEE WITHOUT CARDIOVERSION N/A 01/19/2020   Procedure: TRANSESOPHAGEAL ECHOCARDIOGRAM (TEE);  Surgeon: Fay Records, MD;  Location: [place];  Service: Cardiovascular;  Laterality: N/A;     No outpatient medications have been marked as taking for the 09/11/21 encounter (Video Visit) with Sueanne Margarita, MD.     Allergies:   Levaquin [levofloxacin], Vancomycin, Victoza [liraglutide], and Tape   Social History   Tobacco Use   Smoking status: Never   Smokeless tobacco: Never  Vaping Use   Vaping Use: Never used  Substance Use Topics   Alcohol use: Not Currently   Drug use: No     Family Hx: The patient's family history includes Atrial fibrillation in her mother; Emphysema in her father; Lung cancer in her father; Pneumonia in her mother; Rheum arthritis in her sister.  ROS:   Please see the history of present illness.     All other systems reviewed and are negative.   Prior CV studies:   The following  studies were reviewed today:  PAP compliance download  Labs/Other Tests and Data Reviewed:    EKG:  No ECG reviewed.  Recent Labs: No results found for requested labs within last 8760 hours.   Recent Lipid Panel Lab Results  Component Value Date/Time   CHOL 107 01/18/2020 02:50 AM   TRIG 112 01/18/2020 02:50 AM   HDL 20 (L) 01/18/2020 02:50 AM   CHOLHDL 5.4 01/18/2020 02:50 AM   LDLCALC 65 01/18/2020 02:50 AM    Wt Readings from Last 3 Encounters:  08/13/20 (!) 378 lb (171.5 kg)  06/10/20 (!) 364 lb (165.1 kg)  05/09/20 (!) 360 lb (163.3 kg)     Risk Assessment/Calculations:    CHA2DS2-VASc Score = 3  This indicates a 3.2% annual risk of stroke.  The patient's score is based upon: CHF History: 0 HTN History: 1 Diabetes History: 1 Stroke History: 0 Vascular Disease History: 0 Age Score: 0 Gender Score: 1      Objective:    Vital Signs:  There were no vitals taken for this visit.   VITAL SIGNS:  reviewed GEN:  no acute distress EYES:  sclerae anicteric, EOMI - Extraocular Movements Intact RESPIRATORY:  normal respiratory effort, symmetric expansion CARDIOVASCULAR:  no peripheral edema SKIN:  no rash, lesions or ulcers. MUSCULOSKELETAL:  no obvious deformities. NEURO:  alert and oriented x 3, no obvious focal deficit PSYCH:  normal affect  ASSESSMENT & PLAN:    OSA - The patient is tolerating PAP therapy well without any problems.  The patient has been using and benefiting from PAP use and will continue to benefit from therapy.  -I will get a download on her device -she does not use a chin strap so likely is contributing to her mouth dryness but she says that she cannot get it off fast enough to use the bathroom at night.    PAF -She is maintaining normal sinus rhythm on exam and denies any palpitations -she she denies any bleeding issues with DOAC -CHA2DS2-VASc score is 3 -Continue prescription drug management with Eliquis 5 mg twice daily carvedilol 3.125  mg twice daily with as needed refills -I have personally reviewed and interpreted outside labs performed by patient's PCP which showed SCR 1.3, K+ 4.4 and Hbg 13.6 in May 2022   3.  Pericardial effusion -This has resolved by echo  4.  Cardiomyopathy -Felt tachycardia related from A. fib with RVR -Initial EF 35 to 40% -Normalized with EF 60 to 65% on echo 01/20/2020  COVID-19 Education: The signs and symptoms of COVID-19 were discussed with the patient and how to seek care for testing (follow up with PCP or arrange E-visit).  The importance of social distancing was discussed today.  Time:   Today, I have spent 20 minutes with the patient with telehealth technology discussing the above problems.     Medication Adjustments/Labs and Tests Ordered: Current medicines are reviewed at length with the patient today.  Concerns regarding medicines are outlined above.   Tests Ordered: No orders of the defined types were placed in this encounter.   Medication Changes: No orders of the defined types were placed in this encounter.   Follow Up:  In Person in 1 year(s)  Signed, Fransico Him, MD  09/11/2021 12:33 PM    [place]

## 2021-09-11 NOTE — Telephone Encounter (Addendum)
Patient sees dr Maxwell Caul for her sleep but she wants to switch to dr Radford Pax. She is already on cpap and using it.   Per dr Radford Pax, yes I know but I need a download.  Patient will bring her chip on Friday 09/19/21 to get a download.  Sleep study received and sent to be scanned in.

## 2021-09-11 NOTE — Patient Instructions (Signed)
Medication Instructions:  Your physician recommends that you continue on your current medications as directed. Please refer to the Current Medication list given to you today.  *If you need a refill on your cardiac medications before your next appointment, please call your pharmacy*   Lab Work: NONE If you have labs (blood work) drawn today and your tests are completely normal, you will receive your results only by: [place] (if you have MyChart) OR A paper copy in the mail If you have any lab test that is abnormal or we need to change your treatment, we will call you to review the results.   Testing/Procedures: NONE   Follow-Up: At [facility], you and your health needs are our priority.  As part of our continuing mission to provide you with exceptional heart care, we have created designated Provider Care Teams.  These Care Teams include your primary Cardiologist (physician) and Advanced Practice Providers (APPs -  Physician Assistants and Nurse Practitioners) who all work together to provide you with the care you need, when you need it.  We recommend signing up for the patient portal called "MyChart".  Sign up information is provided on this After Visit Summary.  MyChart is used to connect with patients for Virtual Visits (Telemedicine).  Patients are able to view lab/test results, encounter notes, upcoming appointments, etc.  Non-urgent messages can be sent to your provider as well.   To learn more about what you can do with MyChart, go to [email or web address].    Your next appointment:   1 year(s)  The format for your next appointment:   In Person  Provider:   Fransico Him, MD {

## 2021-09-22 ENCOUNTER — Telehealth: Payer: Self-pay

## 2021-09-22 NOTE — Telephone Encounter (Signed)
Provider page of application was placed in Lynn's box. I have completed the form and placed in Dr Landis Gandy box for signature.

## 2021-09-27 ENCOUNTER — Other Ambulatory Visit: Payer: Self-pay | Admitting: Cardiology

## 2021-10-03 ENCOUNTER — Encounter: Payer: Self-pay | Admitting: Cardiology

## 2021-10-09 ENCOUNTER — Encounter: Payer: Self-pay | Admitting: Cardiology

## 2021-10-09 NOTE — Telephone Encounter (Signed)
Placed at front desk

## 2021-11-21 NOTE — Telephone Encounter (Signed)
Download received and scanned into epic 10/03/21.

## 2021-12-15 NOTE — Telephone Encounter (Signed)
**Note De-Identified Holly [name] Obfuscation** Per letter from [facility] received Holly [name] fax they have denied the pt asst with Eliquis at this time. ?Reason: Documentation of 3% out of pocket RX expense, based on the household adjusted gross income, not met. ? ?The letter states that they have notified the pt of this as well. ? ?

## 2022-01-01 DIAGNOSIS — Z794 Long term (current) use of insulin: Secondary | ICD-10-CM | POA: Diagnosis not present

## 2022-01-01 DIAGNOSIS — E11319 Type 2 diabetes mellitus with unspecified diabetic retinopathy without macular edema: Secondary | ICD-10-CM | POA: Diagnosis not present

## 2022-01-01 DIAGNOSIS — E1165 Type 2 diabetes mellitus with hyperglycemia: Secondary | ICD-10-CM | POA: Diagnosis not present

## 2022-01-01 DIAGNOSIS — E538 Deficiency of other specified B group vitamins: Secondary | ICD-10-CM | POA: Diagnosis not present

## 2022-01-07 ENCOUNTER — Telehealth: Payer: Self-pay

## 2022-01-07 NOTE — Telephone Encounter (Signed)
**Note De-Identified Amorina Doerr Obfuscation** Letter received from [facility] Verneal Wiers fax stating that they have approved the pt for Eliquis assistance until 10/11/2022. ?BMSPAF Case #: [id number] ? ?The letter states that they have notified the pt of this approval as well. ?

## 2022-01-08 DIAGNOSIS — E1165 Type 2 diabetes mellitus with hyperglycemia: Secondary | ICD-10-CM | POA: Diagnosis not present

## 2022-01-08 DIAGNOSIS — E7800 Pure hypercholesterolemia, unspecified: Secondary | ICD-10-CM | POA: Diagnosis not present

## 2022-01-08 DIAGNOSIS — I700 Atherosclerosis of aorta: Secondary | ICD-10-CM | POA: Diagnosis not present

## 2022-01-08 DIAGNOSIS — K219 Gastro-esophageal reflux disease without esophagitis: Secondary | ICD-10-CM | POA: Diagnosis not present

## 2022-01-08 DIAGNOSIS — I480 Paroxysmal atrial fibrillation: Secondary | ICD-10-CM | POA: Diagnosis not present

## 2022-01-08 DIAGNOSIS — G4733 Obstructive sleep apnea (adult) (pediatric): Secondary | ICD-10-CM | POA: Diagnosis not present

## 2022-01-08 DIAGNOSIS — M170 Bilateral primary osteoarthritis of knee: Secondary | ICD-10-CM | POA: Diagnosis not present

## 2022-01-08 DIAGNOSIS — N1832 Chronic kidney disease, stage 3b: Secondary | ICD-10-CM | POA: Diagnosis not present

## 2022-01-08 DIAGNOSIS — D6869 Other thrombophilia: Secondary | ICD-10-CM | POA: Diagnosis not present

## 2022-01-08 DIAGNOSIS — G43009 Migraine without aura, not intractable, without status migrainosus: Secondary | ICD-10-CM | POA: Diagnosis not present

## 2022-02-04 DIAGNOSIS — R809 Proteinuria, unspecified: Secondary | ICD-10-CM | POA: Diagnosis not present

## 2022-02-04 DIAGNOSIS — N2581 Secondary hyperparathyroidism of renal origin: Secondary | ICD-10-CM | POA: Diagnosis not present

## 2022-02-04 DIAGNOSIS — D631 Anemia in chronic kidney disease: Secondary | ICD-10-CM | POA: Diagnosis not present

## 2022-02-04 DIAGNOSIS — E1122 Type 2 diabetes mellitus with diabetic chronic kidney disease: Secondary | ICD-10-CM | POA: Diagnosis not present

## 2022-02-04 DIAGNOSIS — N183 Chronic kidney disease, stage 3 unspecified: Secondary | ICD-10-CM | POA: Diagnosis not present

## 2022-02-04 DIAGNOSIS — I129 Hypertensive chronic kidney disease with stage 1 through stage 4 chronic kidney disease, or unspecified chronic kidney disease: Secondary | ICD-10-CM | POA: Diagnosis not present

## 2022-02-11 ENCOUNTER — Encounter: Payer: Self-pay | Admitting: Cardiology

## 2022-02-13 ENCOUNTER — Telehealth (INDEPENDENT_AMBULATORY_CARE_PROVIDER_SITE_OTHER): Payer: Medicare Other | Admitting: Cardiology

## 2022-02-13 ENCOUNTER — Encounter: Payer: Self-pay | Admitting: Cardiology

## 2022-02-13 VITALS — BP 119/61 | HR 66 | Ht 67.0 in | Wt 392.0 lb

## 2022-02-13 DIAGNOSIS — I480 Paroxysmal atrial fibrillation: Secondary | ICD-10-CM | POA: Diagnosis not present

## 2022-02-13 DIAGNOSIS — R072 Precordial pain: Secondary | ICD-10-CM

## 2022-02-13 DIAGNOSIS — G4733 Obstructive sleep apnea (adult) (pediatric): Secondary | ICD-10-CM | POA: Diagnosis not present

## 2022-02-13 DIAGNOSIS — Z9989 Dependence on other enabling machines and devices: Secondary | ICD-10-CM

## 2022-02-13 DIAGNOSIS — R079 Chest pain, unspecified: Secondary | ICD-10-CM | POA: Diagnosis not present

## 2022-02-13 DIAGNOSIS — I420 Dilated cardiomyopathy: Secondary | ICD-10-CM | POA: Diagnosis not present

## 2022-02-13 DIAGNOSIS — I3139 Other pericardial effusion (noninflammatory): Secondary | ICD-10-CM | POA: Diagnosis not present

## 2022-02-13 NOTE — Addendum Note (Signed)
Addended by: Antonieta Iba on: 02/13/2022 12:03 PM ? ? Modules accepted: Orders ? ?

## 2022-02-13 NOTE — Patient Instructions (Signed)
Medication Instructions:  ?Your physician recommends that you continue on your current medications as directed. Please refer to the Current Medication list given to you today. ? ? ?*If you need a refill on your cardiac medications before your next appointment, please call your pharmacy* ? ? ?Testing/Procedures: ?Your physician has requested that you have an echocardiogram. Echocardiography is a painless test that uses sound waves to create images of your heart. It provides your doctor with information about the size and shape of your heart and how well your heart?s chambers and valves are working. This procedure takes approximately one hour. There are no restrictions for this procedure. ? ?Your physician has requested that you have a lexiscan myoview. For further information please visit [email or web address]. Please follow instruction sheet, as given. ? ?Follow-Up: ?At [facility], you and your health needs are our priority.  As part of our continuing mission to provide you with exceptional heart care, we have created designated Provider Care Teams.  These Care Teams include your primary Cardiologist (physician) and Advanced Practice Providers (APPs -  Physician Assistants and Nurse Practitioners) who all work together to provide you with the care you need, when you need it. ? ?Follow up after receiving your new device.  ? ?Important Information About Sugar ? ? ? ? ?  ?

## 2022-02-13 NOTE — Addendum Note (Signed)
Addended by: Fransico Him R on: 02/13/2022 12:08 PM ? ? Modules accepted: Orders ? ?

## 2022-02-13 NOTE — Progress Notes (Signed)
? ?Virtual Visit via Video Note  ? ?This visit type was conducted due to national recommendations for restrictions regarding the COVID-19 Pandemic (e.g. social distancing) in an effort to limit this patient's exposure and mitigate transmission in our community.  Due to her co-morbid illnesses, this patient is at least at moderate risk for complications without adequate follow up.  This format is felt to be most appropriate for this patient at this time.  All issues noted in this document were discussed and addressed.  A limited physical exam was performed with this format.  Please refer to the patient's chart for her consent to telehealth for [facility]. ? ? ?Date:  02/13/2022  ? ?ID:  Holly [name], DOB [birth year], MRN [id number] ?The patient was identified using 2 identifiers. ? ?Patient Location: Home ?Provider Location: Office/Clinic ? ? ?PCP:  Shirline Frees, MD ?  ?[facility] Providers ?Cardiologist:  Fransico Him, MD    ? ?Evaluation Performed:  Follow-Up Visit ? ?Chief Complaint:  OSA ? ?History of Present Illness:   ? ?Holly [name] is a 56 y.o. female with with history of morbid obesity, OSA on CPAP, anxiety depression, DM.  On 01/16/2020 during left ureteroscopy and stent placement she went into atrial fib with RVR.  This did not improve with IV diltiazem so she was started on IV amiodarone and underwent TEE/DCCV and converted to normal sinus rhythm.  2D echo showed large pericardial effusion and was placed on colchicine. ?  ?Patient was seen in the A. fib clinic 01/30/2020 who recommended waiting 1 month after cardioversion before she undergoes any further urological procedures.  Plan was for amiodarone to be a bridge to other options to control her A. fib in the future.  CHA2DS2-VASc equals 5 on Eliquis.  Colchicine dose lowered because of interaction with amiodarone.  Repeat 2D echocardiogram showed resolution of her pericardial effusion. ? ?She is here today for followup and is doing well.   She tells me that a few weeks ago she awakened with bad heart burn which is not unusual.  She has GERD and takes Protonix.  She took a few tums and went back to sleep.  She the chest discomfort was mild chest discomfort under her right breast.  When she awakened the next am the GERD and chest discomfort was gone but she had some pressure under her sternum.  When she drank anything it felt like something was stuck in her esophagus and the liquid went around it.  She says that it did not hurt.  It persisted for several days and she called her PCP.  She did not feel that it was cardiac.  Her sx lasted an entire week but never went to urgent Care.  She has not had any problems since then.  There was no SOB, nausea or diaphoresis with the discomfort.  ? ?She denies any SOB, DOE, PND, orthopnea, dizziness, palpitations or syncope. She has chronic LE edema related to sedentary state and morbid obesity.  She is compliant with her meds and is tolerating meds with no SE.    ? ?She is doing well with her CPAP device and thinks that she has gotten used to it.  She tolerates the mask and feels the pressure is adequate.  She needs some new supplies as her straps are falling apart.  She says that her device is > 44 years old and would like a new one.  She still has some mouth dryness that her PCP feels is related  to meds she takes.   She feels rested when she gets up in the am. Some of her meds make her sleepy in the afternoon.   ? ?The patient does not have symptoms concerning for COVID-19 infection (fever, chills, cough, or new shortness of breath).  ? ? ?Past Medical History:  ?Diagnosis Date  ? Anxiety   ? Arthritis   ? lower back  ? Asthma   ? was told she cough variant asthma  ? Atrial fibrillation ([place]) 01/2020  ? Cancer [facility]) 2016  ? Carpal tunnel syndrome, bilateral   ? Cluster headache   ? Depression   ? Diabetes mellitus without complication ([place])   ? type 2  ? Endometrial polyp   ? GERD (gastroesophageal reflux disease)    ? Herniated disc, cervical   ? History of hiatal hernia   ? History of kidney stones   ? Hypertension   ? Kidney disease   ? Dr. Merita Norton- CKA (Stage III)  ? Morbid obesity ([place])   ? Osteoarthritis of both knees   ? Peripheral edema   ? Pneumonia   ? Shortness of breath dyspnea   ? on exertion  ? Shoulder impingement   ? Bilateral  ? Sleep apnea   ? CPAP  ? ?Past Surgical History:  ?Procedure Laterality Date  ? ANKLE ARTHROSCOPY Right   ? bone spurs  ? CARDIOVERSION N/A 01/19/2020  ? Procedure: CARDIOVERSION;  Surgeon: Fay Records, MD;  Location: [place];  Service: Cardiovascular;  Laterality: N/A;  ? CYSTOSCOPY WITH RETROGRADE PYELOGRAM, URETEROSCOPY AND STENT PLACEMENT Bilateral 03/26/2020  ? Procedure: CYSTOSCOPY WITH RETROGRADE PYELOGRAM, URETEROSCOPY AND STENT PLACEMENT;  Surgeon: Robley Fries, MD;  [facility];  Service: Urology;  Laterality: Bilateral;  90 MINS  ? CYSTOSCOPY WITH RETROGRADE PYELOGRAM, URETEROSCOPY AND STENT PLACEMENT Left 04/16/2020  ? Procedure: CYSTOSCOPY WITH RETROGRADE PYELOGRAM, URETEROSCOPY AND STENT PLACEMENT;  Surgeon: Robley Fries, MD;  [facility];  Service: Urology;  Laterality: Left;  90 MINS  ? FOOT NEUROMA SURGERY Left   ? HOLMIUM LASER APPLICATION Bilateral 06/12/5175  ? Procedure: HOLMIUM LASER APPLICATION;  Surgeon: Robley Fries, MD;  [facility];  Service: Urology;  Laterality: Bilateral;  ? HOLMIUM LASER APPLICATION Left 10/18/735  ? Procedure: HOLMIUM LASER APPLICATION;  Surgeon: Robley Fries, MD;  [facility];  Service: Urology;  Laterality: Left;  ? HYSTEROSCOPY WITH D & C N/A 06/25/2015  ? Procedure: DILATATION AND CURETTAGE /HYSTEROSCOPY;  Surgeon: Brien Few, MD;  Location: [facility];  Service: Gynecology;  Laterality: N/A;  ? IRRIGATION AND DEBRIDEMENT KNEE Left   ? for staph infection x2  ? KNEE ARTHROSCOPY Left   ? TEAR DUCT PROBING Right   ? TEE WITHOUT CARDIOVERSION N/A 01/19/2020  ? Procedure: TRANSESOPHAGEAL ECHOCARDIOGRAM  (TEE);  Surgeon: Fay Records, MD;  Location: [place];  Service: Cardiovascular;  Laterality: N/A;  ?  ? ?Current Meds  ?Medication Sig  ? apixaban (ELIQUIS) 5 MG TABS tablet Take 1 tablet (5 mg total) by mouth 2 (two) times daily.  ? Ascorbic Acid (VITAMIN C) 1000 MG tablet Take 1,000 mg by mouth in the morning and at bedtime.   ? atorvastatin (LIPITOR) 10 MG tablet Take 10 mg by mouth at bedtime.  ? BD VEO INSULIN SYRINGE U/F 31G X 15/64" 1 ML MISC USE SYRINGE TO INJECT INSULIN 3 TIMES A DAY SUBCUTANEOUS FOR 30 DAYS  ? calcium carbonate (OS-CAL) 600 MG TABS tablet Take 600 mg  by mouth 2 (two) times daily with a meal.  ? carvedilol (COREG) 3.125 MG tablet Take 1 tablet (3.125 mg total) by mouth 2 (two) times daily with a meal.  ? Cholecalciferol (VITAMIN D) 50 MCG (2000 UT) tablet Take 2,000 Units by mouth in the morning and at bedtime.   ? citalopram (CELEXA) 40 MG tablet Take 40 mg by mouth every evening.   ? cyanocobalamin (,VITAMIN B-12,) 1000 MCG/ML injection Inject 1,000 mcg into the muscle every 30 (thirty) days.  ? ferrous sulfate 325 (65 FE) MG tablet Take 325 mg by mouth daily with breakfast.  ? fluticasone (FLONASE) 50 MCG/ACT nasal spray Place 2 sprays into both nostrils daily as needed for allergies or rhinitis.   ? HUMALOG MIX 75/25 KWIKPEN (75-25) 100 UNIT/ML KwikPen 240u in the amj 230u in the pm  ? HYDROcodone-acetaminophen (NORCO) 10-325 MG tablet Take 1 tablet by mouth every 6 (six) hours as needed (Pain).   ? KERENDIA 10 MG TABS Take 1 tablet by mouth daily.  ? Lancets (ONETOUCH DELICA PLUS [id number]) MISC   ? levocetirizine (XYZAL) 5 MG tablet Take 5 mg by mouth at bedtime.  ? losartan (COZAAR) 25 MG tablet TAKE 1 TABLET BY MOUTH EVERY DAY  ? metFORMIN (GLUCOPHAGE-XR) 500 MG 24 hr tablet Take 500 mg by mouth daily.   ? montelukast (SINGULAIR) 10 MG tablet Take 10 mg by mouth daily.   ? norethindrone (AYGESTIN) 5 MG tablet Take 20 mg by mouth daily.   ? ONETOUCH VERIO test strip   ?  pantoprazole (PROTONIX) 40 MG tablet Take 40 mg by mouth daily.  ? Propylene Glycol-Glycerin (SOOTHE) 0.6-0.6 % SOLN Place 2 drops into both eyes daily as needed (Dry eye). Sooth  ? solifenacin (VESICARE) 10

## 2022-02-14 ENCOUNTER — Telehealth: Payer: Self-pay | Admitting: *Deleted

## 2022-02-14 DIAGNOSIS — G4733 Obstructive sleep apnea (adult) (pediatric): Secondary | ICD-10-CM

## 2022-02-14 NOTE — Telephone Encounter (Signed)
Order placed to [facility] via community message. 

## 2022-02-14 NOTE — Telephone Encounter (Signed)
Per dr Radford Pax: ?order new ResMed CPAP at 7cm H2O with heated humidity and mask of choice>>followup virtual 6 weeks after she gets her device ? ?order supplies and new mask ? ? ?

## 2022-02-14 NOTE — Addendum Note (Signed)
Addended by: Freada Bergeron on: 02/14/2022 04:16 PM ? ? Modules accepted: Orders ? ?

## 2022-02-20 ENCOUNTER — Telehealth (HOSPITAL_COMMUNITY): Payer: Self-pay | Admitting: *Deleted

## 2022-02-20 NOTE — Telephone Encounter (Signed)
Left message on voicemail per DPR in reference to upcoming appointment scheduled on 02/27/2022 at 12:30 with detailed instructions given per Myocardial Perfusion Study Information Sheet for the test. LM to arrive 15 minutes early, and that it is imperative to arrive on time for appointment to keep from having the test rescheduled. If you need to cancel or reschedule your appointment, please call the office within 24 hours of your appointment. Failure to do so may result in a cancellation of your appointment, and a $50 no show fee. Phone number given for call back for any questions.  ?  ?

## 2022-02-27 ENCOUNTER — Ambulatory Visit (HOSPITAL_COMMUNITY): Payer: Medicare Other | Attending: Cardiovascular Disease

## 2022-02-27 ENCOUNTER — Telehealth: Payer: Self-pay | Admitting: Cardiology

## 2022-02-27 DIAGNOSIS — R072 Precordial pain: Secondary | ICD-10-CM | POA: Diagnosis not present

## 2022-02-27 MED ORDER — TECHNETIUM TC 99M TETROFOSMIN IV KIT
30.7000 | PACK | Freq: Once | INTRAVENOUS | Status: AC | PRN
  Administered 2022-02-27: 30.7 via INTRAVENOUS

## 2022-02-27 NOTE — Telephone Encounter (Signed)
Patient calling in bout receiving her new cpap. Please advise

## 2022-03-02 ENCOUNTER — Ambulatory Visit (HOSPITAL_COMMUNITY): Payer: Medicare Other | Attending: Cardiology

## 2022-03-02 ENCOUNTER — Ambulatory Visit (HOSPITAL_BASED_OUTPATIENT_CLINIC_OR_DEPARTMENT_OTHER): Payer: Medicare Other

## 2022-03-02 DIAGNOSIS — R079 Chest pain, unspecified: Secondary | ICD-10-CM

## 2022-03-02 DIAGNOSIS — Z9989 Dependence on other enabling machines and devices: Secondary | ICD-10-CM | POA: Insufficient documentation

## 2022-03-02 DIAGNOSIS — I3139 Other pericardial effusion (noninflammatory): Secondary | ICD-10-CM | POA: Insufficient documentation

## 2022-03-02 DIAGNOSIS — R072 Precordial pain: Secondary | ICD-10-CM | POA: Diagnosis not present

## 2022-03-02 DIAGNOSIS — I480 Paroxysmal atrial fibrillation: Secondary | ICD-10-CM | POA: Diagnosis not present

## 2022-03-02 DIAGNOSIS — G4733 Obstructive sleep apnea (adult) (pediatric): Secondary | ICD-10-CM | POA: Insufficient documentation

## 2022-03-02 DIAGNOSIS — I420 Dilated cardiomyopathy: Secondary | ICD-10-CM

## 2022-03-02 LAB — MYOCARDIAL PERFUSION IMAGING
LV dias vol: 110 mL (ref 46–106)
LV sys vol: 51 mL
Nuc Stress EF: 53 %
Peak HR: 80 {beats}/min
Rest HR: 66 {beats}/min
Rest Nuclear Isotope Dose: 30.7 mCi
SDS: 1
SRS: 1
SSS: 2
ST Depression (mm): 0 mm
Stress Nuclear Isotope Dose: 31.8 mCi
TID: 0.89

## 2022-03-02 LAB — ECHOCARDIOGRAM COMPLETE
Area-P 1/2: 4.29 cm2
S' Lateral: 3.2 cm

## 2022-03-02 MED ORDER — PERFLUTREN LIPID MICROSPHERE
1.0000 mL | INTRAVENOUS | Status: AC | PRN
  Administered 2022-03-02: 3 mL via INTRAVENOUS

## 2022-03-02 MED ORDER — TECHNETIUM TC 99M TETROFOSMIN IV KIT
31.8000 | PACK | Freq: Once | INTRAVENOUS | Status: AC | PRN
  Administered 2022-03-02: 31.8 via INTRAVENOUS

## 2022-03-02 MED ORDER — REGADENOSON 0.4 MG/5ML IV SOLN
0.4000 mg | Freq: Once | INTRAVENOUS | Status: AC
  Administered 2022-03-02: 0.4 mg via INTRAVENOUS

## 2022-03-02 NOTE — Telephone Encounter (Signed)
Return Call: Patient understands [facility] was calling to set her up with her cpap.

## 2022-03-24 DIAGNOSIS — E11319 Type 2 diabetes mellitus with unspecified diabetic retinopathy without macular edema: Secondary | ICD-10-CM | POA: Diagnosis not present

## 2022-04-25 DIAGNOSIS — N3281 Overactive bladder: Secondary | ICD-10-CM | POA: Diagnosis not present

## 2022-04-25 DIAGNOSIS — K219 Gastro-esophageal reflux disease without esophagitis: Secondary | ICD-10-CM | POA: Diagnosis not present

## 2022-04-25 DIAGNOSIS — G4733 Obstructive sleep apnea (adult) (pediatric): Secondary | ICD-10-CM | POA: Diagnosis not present

## 2022-04-25 DIAGNOSIS — E113299 Type 2 diabetes mellitus with mild nonproliferative diabetic retinopathy without macular edema, unspecified eye: Secondary | ICD-10-CM | POA: Diagnosis not present

## 2022-04-25 DIAGNOSIS — M170 Bilateral primary osteoarthritis of knee: Secondary | ICD-10-CM | POA: Diagnosis not present

## 2022-05-01 DIAGNOSIS — E11319 Type 2 diabetes mellitus with unspecified diabetic retinopathy without macular edema: Secondary | ICD-10-CM | POA: Diagnosis not present

## 2022-05-01 DIAGNOSIS — Z794 Long term (current) use of insulin: Secondary | ICD-10-CM | POA: Diagnosis not present

## 2022-05-01 DIAGNOSIS — E1165 Type 2 diabetes mellitus with hyperglycemia: Secondary | ICD-10-CM | POA: Diagnosis not present

## 2022-05-02 DIAGNOSIS — G4733 Obstructive sleep apnea (adult) (pediatric): Secondary | ICD-10-CM | POA: Diagnosis not present

## 2022-05-02 DIAGNOSIS — N3281 Overactive bladder: Secondary | ICD-10-CM | POA: Diagnosis not present

## 2022-05-02 DIAGNOSIS — M170 Bilateral primary osteoarthritis of knee: Secondary | ICD-10-CM | POA: Diagnosis not present

## 2022-05-02 DIAGNOSIS — K219 Gastro-esophageal reflux disease without esophagitis: Secondary | ICD-10-CM | POA: Diagnosis not present

## 2022-05-02 DIAGNOSIS — E113299 Type 2 diabetes mellitus with mild nonproliferative diabetic retinopathy without macular edema, unspecified eye: Secondary | ICD-10-CM | POA: Diagnosis not present

## 2022-05-26 DIAGNOSIS — M170 Bilateral primary osteoarthritis of knee: Secondary | ICD-10-CM | POA: Diagnosis not present

## 2022-05-26 DIAGNOSIS — E113299 Type 2 diabetes mellitus with mild nonproliferative diabetic retinopathy without macular edema, unspecified eye: Secondary | ICD-10-CM | POA: Diagnosis not present

## 2022-05-26 DIAGNOSIS — N3281 Overactive bladder: Secondary | ICD-10-CM | POA: Diagnosis not present

## 2022-05-26 DIAGNOSIS — K219 Gastro-esophageal reflux disease without esophagitis: Secondary | ICD-10-CM | POA: Diagnosis not present

## 2022-05-26 DIAGNOSIS — G4733 Obstructive sleep apnea (adult) (pediatric): Secondary | ICD-10-CM | POA: Diagnosis not present

## 2022-06-13 DIAGNOSIS — G4733 Obstructive sleep apnea (adult) (pediatric): Secondary | ICD-10-CM | POA: Diagnosis not present

## 2022-06-26 DIAGNOSIS — K219 Gastro-esophageal reflux disease without esophagitis: Secondary | ICD-10-CM | POA: Diagnosis not present

## 2022-06-26 DIAGNOSIS — N3281 Overactive bladder: Secondary | ICD-10-CM | POA: Diagnosis not present

## 2022-06-26 DIAGNOSIS — G4733 Obstructive sleep apnea (adult) (pediatric): Secondary | ICD-10-CM | POA: Diagnosis not present

## 2022-06-26 DIAGNOSIS — M170 Bilateral primary osteoarthritis of knee: Secondary | ICD-10-CM | POA: Diagnosis not present

## 2022-06-26 DIAGNOSIS — E113299 Type 2 diabetes mellitus with mild nonproliferative diabetic retinopathy without macular edema, unspecified eye: Secondary | ICD-10-CM | POA: Diagnosis not present

## 2022-07-10 DIAGNOSIS — N1832 Chronic kidney disease, stage 3b: Secondary | ICD-10-CM | POA: Diagnosis not present

## 2022-07-10 DIAGNOSIS — I872 Venous insufficiency (chronic) (peripheral): Secondary | ICD-10-CM | POA: Diagnosis not present

## 2022-07-10 DIAGNOSIS — M170 Bilateral primary osteoarthritis of knee: Secondary | ICD-10-CM | POA: Diagnosis not present

## 2022-07-10 DIAGNOSIS — G250 Essential tremor: Secondary | ICD-10-CM | POA: Diagnosis not present

## 2022-07-10 DIAGNOSIS — I480 Paroxysmal atrial fibrillation: Secondary | ICD-10-CM | POA: Diagnosis not present

## 2022-07-10 DIAGNOSIS — E1122 Type 2 diabetes mellitus with diabetic chronic kidney disease: Secondary | ICD-10-CM | POA: Diagnosis not present

## 2022-07-10 DIAGNOSIS — D6869 Other thrombophilia: Secondary | ICD-10-CM | POA: Diagnosis not present

## 2022-07-26 DIAGNOSIS — K219 Gastro-esophageal reflux disease without esophagitis: Secondary | ICD-10-CM | POA: Diagnosis not present

## 2022-07-26 DIAGNOSIS — M170 Bilateral primary osteoarthritis of knee: Secondary | ICD-10-CM | POA: Diagnosis not present

## 2022-07-26 DIAGNOSIS — N3281 Overactive bladder: Secondary | ICD-10-CM | POA: Diagnosis not present

## 2022-07-26 DIAGNOSIS — E113299 Type 2 diabetes mellitus with mild nonproliferative diabetic retinopathy without macular edema, unspecified eye: Secondary | ICD-10-CM | POA: Diagnosis not present

## 2022-07-26 DIAGNOSIS — G4733 Obstructive sleep apnea (adult) (pediatric): Secondary | ICD-10-CM | POA: Diagnosis not present

## 2022-08-26 DIAGNOSIS — K219 Gastro-esophageal reflux disease without esophagitis: Secondary | ICD-10-CM | POA: Diagnosis not present

## 2022-08-26 DIAGNOSIS — N3281 Overactive bladder: Secondary | ICD-10-CM | POA: Diagnosis not present

## 2022-08-26 DIAGNOSIS — M170 Bilateral primary osteoarthritis of knee: Secondary | ICD-10-CM | POA: Diagnosis not present

## 2022-08-26 DIAGNOSIS — E113299 Type 2 diabetes mellitus with mild nonproliferative diabetic retinopathy without macular edema, unspecified eye: Secondary | ICD-10-CM | POA: Diagnosis not present

## 2022-08-26 DIAGNOSIS — G4733 Obstructive sleep apnea (adult) (pediatric): Secondary | ICD-10-CM | POA: Diagnosis not present

## 2022-09-25 DIAGNOSIS — M170 Bilateral primary osteoarthritis of knee: Secondary | ICD-10-CM | POA: Diagnosis not present

## 2022-09-25 DIAGNOSIS — E113299 Type 2 diabetes mellitus with mild nonproliferative diabetic retinopathy without macular edema, unspecified eye: Secondary | ICD-10-CM | POA: Diagnosis not present

## 2022-09-25 DIAGNOSIS — K219 Gastro-esophageal reflux disease without esophagitis: Secondary | ICD-10-CM | POA: Diagnosis not present

## 2022-09-25 DIAGNOSIS — N3281 Overactive bladder: Secondary | ICD-10-CM | POA: Diagnosis not present

## 2022-09-25 DIAGNOSIS — G4733 Obstructive sleep apnea (adult) (pediatric): Secondary | ICD-10-CM | POA: Diagnosis not present

## 2022-09-28 ENCOUNTER — Telehealth: Payer: Self-pay | Admitting: Cardiology

## 2022-09-28 NOTE — Telephone Encounter (Signed)
Pt is requesting call back to discuss form she mailed in for [facility] assistance. She would like a call back to get update on this form. Please advise.

## 2022-09-29 ENCOUNTER — Other Ambulatory Visit: Payer: Self-pay | Admitting: Cardiology

## 2022-09-29 ENCOUNTER — Encounter: Payer: Self-pay | Admitting: Cardiology

## 2022-09-29 DIAGNOSIS — I480 Paroxysmal atrial fibrillation: Secondary | ICD-10-CM

## 2022-09-30 NOTE — Addendum Note (Signed)
Addended by: Thompson Grayer on: 09/30/2022 03:10 PM   Modules accepted: Orders

## 2022-09-30 NOTE — Telephone Encounter (Signed)
Lab work received from [facility] and reviewed by Dr Radford Pax. Sent to [facility] to be scanned into chart.  CBC on [facility] done on 02/04/22 reviewed by Dr Radford Pax.  This lab work was done at [facility].  I spoke with [facility] and they will send these lab results to office.  Per Dr Radford Pax it is OK to send in assistance program paperwork.  Patient will need CBC and BMP in early 2024.  Patient has appointment with Dr Radford Pax on 11/06/22 and lab work can be done at that time.   I spoke with patient and let her know paperwork will be sent in and that she will  need lab work at next appointment with Dr Radford Pax.

## 2022-10-01 NOTE — Telephone Encounter (Signed)
Returned call to patient and left voicemail let to let her know the forms were faxed 09/30/22 and that Dr. Radford Pax has requested she have a BMP and CBC due to being on Eliquis and the labs received from her PCP did not have these. After looking further in her chart looks as she is schedule for OV and labs on 11/06/22.

## 2022-10-07 ENCOUNTER — Telehealth: Payer: Self-pay

## 2022-10-07 NOTE — Telephone Encounter (Signed)
Patient assistance for Eliquis faxed to [place] patient assistance foundation. Fax confirmation received 09/30/22.

## 2022-10-26 DIAGNOSIS — M170 Bilateral primary osteoarthritis of knee: Secondary | ICD-10-CM | POA: Diagnosis not present

## 2022-10-26 DIAGNOSIS — K219 Gastro-esophageal reflux disease without esophagitis: Secondary | ICD-10-CM | POA: Diagnosis not present

## 2022-10-26 DIAGNOSIS — N3281 Overactive bladder: Secondary | ICD-10-CM | POA: Diagnosis not present

## 2022-10-26 DIAGNOSIS — E113299 Type 2 diabetes mellitus with mild nonproliferative diabetic retinopathy without macular edema, unspecified eye: Secondary | ICD-10-CM | POA: Diagnosis not present

## 2022-10-26 DIAGNOSIS — G4733 Obstructive sleep apnea (adult) (pediatric): Secondary | ICD-10-CM | POA: Diagnosis not present

## 2022-11-06 ENCOUNTER — Ambulatory Visit: Payer: Medicare Other | Admitting: Cardiology

## 2022-11-06 ENCOUNTER — Other Ambulatory Visit: Payer: Medicare Other

## 2022-11-12 ENCOUNTER — Other Ambulatory Visit: Payer: Medicare Other

## 2022-11-19 ENCOUNTER — Encounter (HOSPITAL_COMMUNITY): Payer: Self-pay | Admitting: *Deleted

## 2022-11-26 DIAGNOSIS — K219 Gastro-esophageal reflux disease without esophagitis: Secondary | ICD-10-CM | POA: Diagnosis not present

## 2022-11-26 DIAGNOSIS — G4733 Obstructive sleep apnea (adult) (pediatric): Secondary | ICD-10-CM | POA: Diagnosis not present

## 2022-11-26 DIAGNOSIS — N3281 Overactive bladder: Secondary | ICD-10-CM | POA: Diagnosis not present

## 2022-11-26 DIAGNOSIS — M170 Bilateral primary osteoarthritis of knee: Secondary | ICD-10-CM | POA: Diagnosis not present

## 2022-11-26 DIAGNOSIS — E113299 Type 2 diabetes mellitus with mild nonproliferative diabetic retinopathy without macular edema, unspecified eye: Secondary | ICD-10-CM | POA: Diagnosis not present

## 2022-12-25 DIAGNOSIS — N3281 Overactive bladder: Secondary | ICD-10-CM | POA: Diagnosis not present

## 2022-12-25 DIAGNOSIS — K219 Gastro-esophageal reflux disease without esophagitis: Secondary | ICD-10-CM | POA: Diagnosis not present

## 2022-12-25 DIAGNOSIS — M170 Bilateral primary osteoarthritis of knee: Secondary | ICD-10-CM | POA: Diagnosis not present

## 2022-12-25 DIAGNOSIS — G4733 Obstructive sleep apnea (adult) (pediatric): Secondary | ICD-10-CM | POA: Diagnosis not present

## 2022-12-25 DIAGNOSIS — E113299 Type 2 diabetes mellitus with mild nonproliferative diabetic retinopathy without macular edema, unspecified eye: Secondary | ICD-10-CM | POA: Diagnosis not present

## 2023-01-25 DIAGNOSIS — E113299 Type 2 diabetes mellitus with mild nonproliferative diabetic retinopathy without macular edema, unspecified eye: Secondary | ICD-10-CM | POA: Diagnosis not present

## 2023-01-25 DIAGNOSIS — G4733 Obstructive sleep apnea (adult) (pediatric): Secondary | ICD-10-CM | POA: Diagnosis not present

## 2023-01-25 DIAGNOSIS — M170 Bilateral primary osteoarthritis of knee: Secondary | ICD-10-CM | POA: Diagnosis not present

## 2023-01-25 DIAGNOSIS — N3281 Overactive bladder: Secondary | ICD-10-CM | POA: Diagnosis not present

## 2023-01-25 DIAGNOSIS — K219 Gastro-esophageal reflux disease without esophagitis: Secondary | ICD-10-CM | POA: Diagnosis not present

## 2023-02-24 DIAGNOSIS — M170 Bilateral primary osteoarthritis of knee: Secondary | ICD-10-CM | POA: Diagnosis not present

## 2023-02-24 DIAGNOSIS — G4733 Obstructive sleep apnea (adult) (pediatric): Secondary | ICD-10-CM | POA: Diagnosis not present

## 2023-02-24 DIAGNOSIS — K219 Gastro-esophageal reflux disease without esophagitis: Secondary | ICD-10-CM | POA: Diagnosis not present

## 2023-02-24 DIAGNOSIS — E113299 Type 2 diabetes mellitus with mild nonproliferative diabetic retinopathy without macular edema, unspecified eye: Secondary | ICD-10-CM | POA: Diagnosis not present

## 2023-02-24 DIAGNOSIS — N3281 Overactive bladder: Secondary | ICD-10-CM | POA: Diagnosis not present

## 2023-02-26 ENCOUNTER — Telehealth: Payer: Self-pay | Admitting: Cardiology

## 2023-02-26 NOTE — Telephone Encounter (Signed)
Pt c/o medication issue:  1. Name of Medication:  apixaban (ELIQUIS) 5 MG TABS tablet  2. How are you currently taking this medication (dosage and times per day)?   3. Are you having a reaction (difficulty breathing--STAT)?   4. What is your medication issue?  Patient states she is tired of dealing with Alver Fisher every year and she would like to know if she can be put on a different medication that will be covered by her insurance.

## 2023-02-26 NOTE — Telephone Encounter (Signed)
Returned call to patient.  She doesn't want to deal with BMS she said.  The last time she had to send paperwork it cost her $40.  She has a good supply right now.  We discussed copays w Coumadin.  She is WC dependent and doesn't think she could come for all the INR checks, and the copays would be an issue.   She said she never felt her afib and so does not know if she has been in/out of it at all.  Adv we can help her in our office when paperwork needs sent in to the drug company.  She has scheduled her follow up with APP 05/10/23.  She is also seeing Dr. Mayford Knife for sleep and would prefer an appointment with [facility] to cover both sleep and cardiac issues.  Adv will send a message to Dr. Norris Cross nurse for in case anything opens up for Dr. Mayford Knife.

## 2023-03-03 NOTE — Telephone Encounter (Signed)
Assuming she either did not meet income cutoff or has not spent at least 3% of her annual income on prescription costs which is required for BMS to cover Eliquis for Medicare patients. Not sure why she would have just received a denial letter if she applied for assistance 5 months ago. Looks like [employer] in December, not sure who denial letter was sent to, potentially Larita Fife? Will forward for follow up.

## 2023-03-03 NOTE — Telephone Encounter (Signed)
Spoke with patient regarding concerns about changing from eliquis to another anticoagulant medciation. Patient states that she received a notification that even though she had submitted her pateint assistance application November 2023, she received a letter this week that her application was not approved. She states she has meds on hand for 1-2 months but doesn't understand why now she is unable to get assistance.   Scheduled patient for a visit w/ Dr. Mayford Knife 04/08/23 and will forward patient's concerns to Pharm D pool to see if they can assist with patient assistance application. Reviewed with patient that she needs to check on her supply and contact us if she doesn't have enough to get her to 04/08/23 visit, patient verbalizes understanding.

## 2023-03-04 NOTE — Telephone Encounter (Signed)
**Note De-Identified Lain Tetterton Obfuscation** I called [employer] and was advised by Angie that the pt was denied Eliquis assistance because she has not met her 3% out of pocket amount for her medications yet this year. Per Angie I called the pt and advised her that according to the soft income check they did on her that her 3% out of pocket amount is $1016.00 and that if she feels this amount is incorrect to provide them with her proof of income (S>S> statement, W2s) and they will re-evaluate her application.  The pt states that she will call [employer] today and will provide proof of income if needed. She is aware to call Larita Fife back at Dr Malachy Mood office if she has any questions or concerns.  She thanked me for calling her back to discuss.

## 2023-03-18 ENCOUNTER — Ambulatory Visit: Payer: Medicare Other | Admitting: Cardiology

## 2023-03-19 ENCOUNTER — Encounter: Payer: Self-pay | Admitting: Cardiology

## 2023-03-19 ENCOUNTER — Other Ambulatory Visit: Payer: Self-pay | Admitting: Cardiology

## 2023-03-19 ENCOUNTER — Ambulatory Visit: Payer: Medicare Other | Admitting: Cardiology

## 2023-03-19 ENCOUNTER — Other Ambulatory Visit: Payer: Medicare Other

## 2023-03-23 ENCOUNTER — Telehealth: Payer: Self-pay | Admitting: Cardiology

## 2023-03-23 NOTE — Telephone Encounter (Signed)
Sherwin with [employer] called in requesting pt's last OV notes faxed.   Fax number: [phone]  Reference/claim#: [id number]

## 2023-04-08 ENCOUNTER — Ambulatory Visit: Payer: Medicare Other | Attending: Cardiology | Admitting: Cardiology

## 2023-04-08 ENCOUNTER — Encounter: Payer: Self-pay | Admitting: Cardiology

## 2023-04-08 ENCOUNTER — Ambulatory Visit: Payer: Medicare Other

## 2023-04-08 VITALS — BP 114/82 | HR 75 | Ht 67.0 in | Wt >= 6400 oz

## 2023-04-08 DIAGNOSIS — I420 Dilated cardiomyopathy: Secondary | ICD-10-CM | POA: Diagnosis not present

## 2023-04-08 DIAGNOSIS — I3139 Other pericardial effusion (noninflammatory): Secondary | ICD-10-CM

## 2023-04-08 DIAGNOSIS — I480 Paroxysmal atrial fibrillation: Secondary | ICD-10-CM

## 2023-04-08 DIAGNOSIS — G4733 Obstructive sleep apnea (adult) (pediatric): Secondary | ICD-10-CM

## 2023-04-08 DIAGNOSIS — Z79899 Other long term (current) drug therapy: Secondary | ICD-10-CM | POA: Diagnosis not present

## 2023-04-08 MED ORDER — APIXABAN 5 MG PO TABS: 5.0000 mg | ORAL_TABLET | Freq: Two times a day (BID) | ORAL | 5 refills | Status: DC

## 2023-04-08 NOTE — Progress Notes (Signed)
Date:  04/08/2023   ID:  Holly [name], DOB 05/25/1966, MRN [id number] The patient was identified using 2 identifiers.  PCP:  Noberto Retort, MD   [facility] Providers Cardiologist:  Armanda Magic, MD     Evaluation Performed:  Follow-Up Visit  Chief Complaint:  OSA  History of Present Illness:    Holly [name] is a 57 y.o. female with with history of morbid obesity, OSA on CPAP, anxiety depression, DM.  On 01/16/2020 during left ureteroscopy and stent placement she went into atrial fib with RVR.  This did not improve with IV diltiazem so she was started on IV amiodarone and underwent TEE/DCCV and converted to normal sinus rhythm.  2D echo showed large pericardial effusion and was placed on colchicine.   Patient was seen in the A. fib clinic 01/30/2020 who recommended waiting 1 month after cardioversion before she undergoes any further urological procedures.  Plan was for amiodarone to be a bridge to other options to control her A. fib in the future.  CHA2DS2-VASc equals 5 on Eliquis.  Colchicine dose lowered because of interaction with amiodarone.  Repeat 2D echocardiogram showed resolution of her pericardial effusion.  Nuclear stress test in 2023 showed no ischemia.   She is here today for followup and is doing well.  She denies any chest pain or pressure, SOB, DOE, PND, orthopnea, dizziness, palpitations or syncope. She has chronic LE edema related to chronic venous inuff and morbid obesity and well as sedentary state.  She is compliant with her meds and is tolerating meds with no SE.    She is doing well with her PAP device and thinks that she has gotten used to it.  She tolerates the nasal pillow mask and feels the pressure is adequate.  She has tried a chin strap before but has problems getting her mask off when she has to go to the bathroom and then has an accident.  She also says that her mouth falls open even when she has worn it in the past.  Since going on PAP she feels  rested in the am but does get sleepy during the day but thinks it is related to her pain meds.  She denies any significant nasal dryness or nasal congestion.  She does not think that he snores.    Past Medical History:  Diagnosis Date   Anxiety    Arthritis    lower back   Asthma    was told she cough variant asthma   Atrial fibrillation (HCC) 01/2020   Cancer (HCC) 2016   Carpal tunnel syndrome, bilateral    Cluster headache    Depression    Diabetes mellitus without complication (HCC)    type 2   Endometrial polyp    GERD (gastroesophageal reflux disease)    Herniated disc, cervical    History of hiatal hernia    History of kidney stones    Hypertension    Kidney disease    Dr. Ella Jubilee- CKA (Stage III)   Morbid obesity (HCC)    Osteoarthritis of both knees    Peripheral edema    Pneumonia    Shortness of breath dyspnea    on exertion   Shoulder impingement    Bilateral   Sleep apnea    CPAP   Past Surgical History:  Procedure Laterality Date   ANKLE ARTHROSCOPY Right    bone spurs   CARDIOVERSION N/A 01/19/2020   Procedure: CARDIOVERSION;  Surgeon: Pricilla Riffle, MD;  Location: [facility];  Service: Cardiovascular;  Laterality: N/A;   CYSTOSCOPY WITH RETROGRADE PYELOGRAM, URETEROSCOPY AND STENT PLACEMENT Bilateral 03/26/2020   Procedure: CYSTOSCOPY WITH RETROGRADE PYELOGRAM, URETEROSCOPY AND STENT PLACEMENT;  Surgeon: Noel Christmas, MD;  Location: [facility];  Service: Urology;  Laterality: Bilateral;  90 MINS   CYSTOSCOPY WITH RETROGRADE PYELOGRAM, URETEROSCOPY AND STENT PLACEMENT Left 04/16/2020   Procedure: CYSTOSCOPY WITH RETROGRADE PYELOGRAM, URETEROSCOPY AND STENT PLACEMENT;  Surgeon: Noel Christmas, MD;  Location: [facility];  Service: Urology;  Laterality: Left;  90 MINS   FOOT NEUROMA SURGERY Left    HOLMIUM LASER APPLICATION Bilateral 03/26/2020   Procedure: HOLMIUM LASER APPLICATION;  Surgeon: Noel Christmas, MD;  Location: [facility];  Service: Urology;   Laterality: Bilateral;   HOLMIUM LASER APPLICATION Left 04/16/2020   Procedure: HOLMIUM LASER APPLICATION;  Surgeon: Noel Christmas, MD;  Location: [facility];  Service: Urology;  Laterality: Left;   HYSTEROSCOPY WITH D & C N/A 06/25/2015   Procedure: DILATATION AND CURETTAGE /HYSTEROSCOPY;  Surgeon: Olivia Mackie, MD;  Location: [facility];  Service: Gynecology;  Laterality: N/A;   IRRIGATION AND DEBRIDEMENT KNEE Left    for staph infection x2   KNEE ARTHROSCOPY Left    TEAR DUCT PROBING Right    TEE WITHOUT CARDIOVERSION N/A 01/19/2020   Procedure: TRANSESOPHAGEAL ECHOCARDIOGRAM (TEE);  Surgeon: Pricilla Riffle, MD;  Location: [facility] ENDOSCOPY;  Service: Cardiovascular;  Laterality: N/A;     Current Meds  Medication Sig   apixaban (ELIQUIS) 5 MG TABS tablet Take 1 tablet (5 mg total) by mouth 2 (two) times daily.   Ascorbic Acid (VITAMIN C) 1000 MG tablet Take 1,000 mg by mouth in the morning and at bedtime.    atorvastatin (LIPITOR) 10 MG tablet Take 10 mg by mouth at bedtime.   BD VEO INSULIN SYRINGE U/F 31G X 15/64" 1 ML MISC USE SYRINGE TO INJECT INSULIN 3 TIMES A DAY SUBCUTANEOUS FOR 30 DAYS   calcium carbonate (OS-CAL) 600 MG TABS tablet Take 600 mg by mouth 2 (two) times daily with a meal.   carvedilol (COREG) 3.125 MG tablet TAKE 1 TABLET BY MOUTH TWICE A DAY WITH A MEAL   Cholecalciferol (VITAMIN D) 50 MCG (2000 UT) tablet Take 2,000 Units by mouth in the morning and at bedtime.    citalopram (CELEXA) 40 MG tablet Take 40 mg by mouth every evening.    cyanocobalamin (,VITAMIN B-12,) 1000 MCG/ML injection Inject 1,000 mcg into the muscle every 30 (thirty) days.   ferrous sulfate 325 (65 FE) MG tablet Take 325 mg by mouth daily with breakfast.   fluticasone (FLONASE) 50 MCG/ACT nasal spray Place 2 sprays into both nostrils daily as needed for allergies or rhinitis.    HUMALOG MIX 75/25 KWIKPEN (75-25) 100 UNIT/ML KwikPen 240u in the amj 230u in the pm   HYDROcodone-acetaminophen (NORCO) 10-325  MG tablet Take 1 tablet by mouth every 6 (six) hours as needed (Pain).    hydrOXYzine (ATARAX) 25 MG tablet Take 25 mg by mouth at bedtime as needed for itching.   KERENDIA 10 MG TABS Take 1 tablet by mouth daily.   Lancets (ONETOUCH DELICA PLUS LANCET33G) MISC    levocetirizine (XYZAL) 5 MG tablet Take 5 mg by mouth at bedtime.   losartan (COZAAR) 25 MG tablet Take 1 tablet (25 mg total) by mouth daily.   metFORMIN (GLUCOPHAGE-XR) 500 MG 24 hr tablet Take 500 mg by mouth daily.    montelukast (SINGULAIR) 10 MG tablet  Take 10 mg by mouth daily.    norethindrone (AYGESTIN) 5 MG tablet Take 20 mg by mouth daily.    ONETOUCH VERIO test strip    pantoprazole (PROTONIX) 40 MG tablet Take 40 mg by mouth daily.   Propylene Glycol-Glycerin (SOOTHE) 0.6-0.6 % SOLN Place 2 drops into both eyes daily as needed (Dry eye). Sooth   solifenacin (VESICARE) 10 MG tablet Take 10 mg by mouth daily.   topiramate (TOPAMAX) 100 MG tablet Take 150 mg by mouth every evening.    vitamin B-12 (CYANOCOBALAMIN) 1000 MCG tablet Take 1,000 mcg by mouth daily.     Allergies:   Levaquin [levofloxacin], Vancomycin, Victoza [liraglutide], and Tape   Social History   Tobacco Use   Smoking status: Never   Smokeless tobacco: Never  Vaping Use   Vaping Use: Never used  Substance Use Topics   Alcohol use: Not Currently   Drug use: No     Family Hx: The patient's family history includes Atrial fibrillation in her mother; Emphysema in her father; Lung cancer in her father; Pneumonia in her mother; Rheum arthritis in her sister.  ROS:   Please see the history of present illness.     All other systems reviewed and are negative.   Prior CV studies:   The following studies were reviewed today:  PAP compliance download  Labs/Other Tests and Data Reviewed:    EKG Interpretation Date/Time:  Thursday April 08 2023 13:56:41 EDT Ventricular Rate:  78 PR Interval:  164 QRS Duration:  80 QT Interval:  374 QTC  Calculation: 426 R Axis:   -48  Text Interpretation: Normal sinus rhythm Left axis deviation Cannot rue out anterior infarct age undetermined (cited on or before 10-Jul-2005) When compared with ECG of 10-Jun-2020 14:42, QRS axis Shifted left Confirmed by Armanda Magic ([id number]) on 04/08/2023 2:03:32 PM    Recent Labs: No results found for requested labs within last 365 days.   Recent Lipid Panel Lab Results  Component Value Date/Time   CHOL 107 01/18/2020 02:50 AM   TRIG 112 01/18/2020 02:50 AM   HDL 20 (L) 01/18/2020 02:50 AM   CHOLHDL 5.4 01/18/2020 02:50 AM   LDLCALC 65 01/18/2020 02:50 AM    Wt Readings from Last 3 Encounters:  04/08/23 (!) 402 lb (182.3 kg)  02/13/22 (!) 392 lb (177.8 kg)  09/11/21 (!) 392 lb 3.2 oz (177.9 kg)     Risk Assessment/Calculations:    CHA2DS2-VASc Score =    This indicates a  % annual risk of stroke. The patient's score is based upon:         Objective:    Vital Signs:  BP 114/82   Pulse 75   Ht 5\' 7"  (1.702 m)   Wt (!) 402 lb (182.3 kg)   SpO2 98%   BMI 62.96 kg/m   GEN: Well nourished, well developed in no acute distress HEENT: Normal NECK: No JVD; No carotid bruits LYMPHATICS: No lymphadenopathy CARDIAC:RRR, no murmurs, rubs, gallops RESPIRATORY:  Clear to auscultation without rales, wheezing or rhonchi  ABDOMEN: Soft, non-tender, non-distended MUSCULOSKELETAL: trace BLE edema with chronic venous stasis changes and scaling of the skin  No deformity  SKIN: Warm and dry NEUROLOGIC:  Alert and oriented x 3 PSYCHIATRIC:  Normal affect  ASSESSMENT & PLAN:    OSA - The patient is tolerating PAP therapy well without any problems. The PAP download performed by his DME was personally reviewed and interpreted by me today and showed an AHI of  0.2 /hr on 7 cm H2O with 100% compliance in using more than 4 hours nightly.  The patient has been using and benefiting from PAP use and will continue to benefit from therapy.   PAF -She is  maintaining normal sinus rhythm on exam and denies any palpitations -She has not had any bleeding issues on DOAC -CHA2DS2-VASc score is 3 -Continue prescription drug management with carvedilol 3.125 mg twice daily and apixaban 5 mg twice daily with as needed refills -check BMET and CBC  Pericardial effusion -This has resolved by echo  Cardiomyopathy -Felt tachycardia related from A. fib with RVR -Initial EF 35 to 40% -Normalized with EF 60 to 65% on echo 01/20/2020 -continue prescription drug management with Carvedilol 3.125mg  BID and Losartan 25mg  daily    Medication Adjustments/Labs and Tests Ordered: Current medicines are reviewed at length with the patient today.  Concerns regarding medicines are outlined above.   Tests Ordered: Orders Placed This Encounter  Procedures   EKG 12-Lead     Medication Changes: No orders of the defined types were placed in this encounter.    Follow Up:  6 weeks after she gets her new device  Signed, Armanda Magic, MD  04/08/2023 2:07 PM    [facility]

## 2023-04-08 NOTE — Patient Instructions (Signed)
Medication Instructions:  Your physician recommends that you continue on your current medications as directed. Please refer to the Current Medication list given to you today.  *If you need a refill on your cardiac medications before your next appointment, please call your pharmacy*   Lab Work: Please complete a BMET/CBC today in our lab before you leave.  If you have labs (blood work) drawn today and your tests are completely normal, you will receive your results only by: MyChart Message (if you have MyChart) OR A paper copy in the mail If you have any lab test that is abnormal or we need to change your treatment, we will call you to review the results.   Testing/Procedures: None   Follow-Up:     Your next appointment:   1 year(s)  Provider:   Armanda Magic, MD

## 2023-04-08 NOTE — Addendum Note (Signed)
Addended by: Luellen Pucker on: 04/08/2023 02:21 PM   Modules accepted: Orders

## 2023-04-08 NOTE — Addendum Note (Signed)
Addended by: Malena Peer D on: 04/08/2023 02:42 PM   Modules accepted: Orders

## 2023-04-09 LAB — CBC WITH DIFFERENTIAL/PLATELET
Basophils Absolute: 0.1 10*3/uL (ref 0.0–0.2)
Basos: 1 %
EOS (ABSOLUTE): 0.5 10*3/uL — ABNORMAL HIGH (ref 0.0–0.4)
Eos: 4 %
Hematocrit: 44.7 % (ref 34.0–46.6)
Hemoglobin: 14.1 g/dL (ref 11.1–15.9)
Immature Grans (Abs): 0.1 10*3/uL (ref 0.0–0.1)
Immature Granulocytes: 1 %
Lymphocytes Absolute: 2.2 10*3/uL (ref 0.7–3.1)
Lymphs: 18 %
MCH: 26.7 pg (ref 26.6–33.0)
MCHC: 31.5 g/dL (ref 31.5–35.7)
MCV: 85 fL (ref 79–97)
Monocytes Absolute: 0.9 10*3/uL (ref 0.1–0.9)
Monocytes: 7 %
Neutrophils Absolute: 8.8 10*3/uL — ABNORMAL HIGH (ref 1.4–7.0)
Neutrophils: 69 %
Platelets: 307 10*3/uL (ref 150–450)
RBC: 5.29 x10E6/uL — ABNORMAL HIGH (ref 3.77–5.28)
RDW: 13 % (ref 11.7–15.4)
WBC: 12.5 10*3/uL — ABNORMAL HIGH (ref 3.4–10.8)

## 2023-04-09 LAB — BASIC METABOLIC PANEL
BUN/Creatinine Ratio: 10 (ref 9–23)
BUN: 11 mg/dL (ref 6–24)
CO2: 19 mmol/L — ABNORMAL LOW (ref 20–29)
Calcium: 9.4 mg/dL (ref 8.7–10.2)
Chloride: 104 mmol/L (ref 96–106)
Creatinine, Ser: 1.09 mg/dL — ABNORMAL HIGH (ref 0.57–1.00)
Glucose: 281 mg/dL — ABNORMAL HIGH (ref 70–99)
Potassium: 4.3 mmol/L (ref 3.5–5.2)
Sodium: 138 mmol/L (ref 134–144)
eGFR: 60 mL/min/{1.73_m2} (ref >59)

## 2023-04-14 ENCOUNTER — Other Ambulatory Visit: Payer: Self-pay | Admitting: Cardiology

## 2023-04-15 ENCOUNTER — Encounter: Payer: Self-pay | Admitting: Cardiology

## 2023-04-16 ENCOUNTER — Telehealth: Payer: Self-pay

## 2023-04-16 NOTE — Telephone Encounter (Signed)
Called to discuss pharmacy recommendations for eliquis patient assistance. No answer, left detailed message asking patient to call our office to discuss patient assistance and samples.

## 2023-04-28 ENCOUNTER — Telehealth: Payer: Self-pay

## 2023-04-28 MED ORDER — APIXABAN 5 MG PO TABS: 5.0000 mg | ORAL_TABLET | Freq: Two times a day (BID) | ORAL | 0 refills | Status: DC

## 2023-04-28 NOTE — Telephone Encounter (Signed)
Call to patient to discuss pharmacy recommendations regarding possible alternatives to eliquis, also put this information in a Mychart Message and advised patient it was there for her review.   Advised patient that Dr. Mayford Knife may have further recommendations for which alternatives are safest for her in regards to medication reactions or other chronic conditions.  2 weeks of eliquis samples placed at front desk for patient.

## 2023-04-28 NOTE — Telephone Encounter (Signed)
Spoke with patient regarding eliquis samples versus switching to a different anticoagulant medication. Patient is currently being charged $170 a month for eliquis through her insurance because she has not met her deductible yet. She was previously on patient assistance for this medication but no longer meets critieria. Patient states she is now out of eliquis.  Pharm D recommended patient try the [facility] program to see if she could get a low income subsidy but patient states she has been denied for every patient assistance/low income assistance she has tried due to having too high an income.   Patient is requesting samples while she figures out what to do. She is asking if there is any other anticoagulant medication she can use that is cheaper. I advised that coumadin is cheaper, but patient states she has mobility issues and does not feel she can keep up with the frequent lab monitoring that coumadin requires. Forwarded to Dr. Loretha Brasil D for advice.

## 2023-04-28 NOTE — Addendum Note (Signed)
Addended by: Luellen Pucker on: 04/28/2023 03:24 PM   Modules accepted: Orders

## 2023-05-05 ENCOUNTER — Other Ambulatory Visit: Payer: Self-pay | Admitting: Cardiology

## 2023-05-05 ENCOUNTER — Telehealth: Payer: Self-pay

## 2023-05-05 MED ORDER — DABIGATRAN ETEXILATE MESYLATE 150 MG PO CAPS: 150.0000 mg | ORAL_CAPSULE | Freq: Two times a day (BID) | ORAL | 3 refills | Status: DC

## 2023-05-05 NOTE — Telephone Encounter (Signed)
Per patient request, order for pradaxa sent to pharmacy of choice.

## 2023-05-05 NOTE — Telephone Encounter (Signed)
Refill sent by Rexene Edison, RN

## 2023-05-10 ENCOUNTER — Ambulatory Visit: Payer: Medicare Other | Admitting: Physician Assistant

## 2023-05-10 ENCOUNTER — Other Ambulatory Visit: Payer: Medicare Other

## 2023-05-11 ENCOUNTER — Telehealth: Payer: Self-pay

## 2023-05-11 MED ORDER — DABIGATRAN ETEXILATE MESYLATE 150 MG PO CAPS: 150.0000 mg | ORAL_CAPSULE | Freq: Two times a day (BID) | ORAL | 3 refills | Status: DC

## 2023-05-11 NOTE — Telephone Encounter (Signed)
Patient requesting via MyChart that script be sent to [facility] in [place], [place] on [address] Order placed.

## 2023-05-12 DIAGNOSIS — N2581 Secondary hyperparathyroidism of renal origin: Secondary | ICD-10-CM | POA: Diagnosis not present

## 2023-05-12 DIAGNOSIS — D6869 Other thrombophilia: Secondary | ICD-10-CM | POA: Diagnosis not present

## 2023-05-12 DIAGNOSIS — I700 Atherosclerosis of aorta: Secondary | ICD-10-CM | POA: Diagnosis not present

## 2023-05-12 DIAGNOSIS — N3281 Overactive bladder: Secondary | ICD-10-CM | POA: Diagnosis not present

## 2023-05-12 DIAGNOSIS — E1122 Type 2 diabetes mellitus with diabetic chronic kidney disease: Secondary | ICD-10-CM | POA: Diagnosis not present

## 2023-05-12 DIAGNOSIS — K219 Gastro-esophageal reflux disease without esophagitis: Secondary | ICD-10-CM | POA: Diagnosis not present

## 2023-05-12 DIAGNOSIS — Z794 Long term (current) use of insulin: Secondary | ICD-10-CM | POA: Diagnosis not present

## 2023-05-12 DIAGNOSIS — E1165 Type 2 diabetes mellitus with hyperglycemia: Secondary | ICD-10-CM | POA: Diagnosis not present

## 2023-05-12 DIAGNOSIS — I480 Paroxysmal atrial fibrillation: Secondary | ICD-10-CM | POA: Diagnosis not present

## 2023-05-12 DIAGNOSIS — Z9989 Dependence on other enabling machines and devices: Secondary | ICD-10-CM | POA: Diagnosis not present

## 2023-05-12 DIAGNOSIS — N1832 Chronic kidney disease, stage 3b: Secondary | ICD-10-CM | POA: Diagnosis not present

## 2023-05-17 ENCOUNTER — Telehealth: Payer: Self-pay | Admitting: Cardiology

## 2023-05-17 NOTE — Telephone Encounter (Signed)
Pt c/o medication issue:  1. Name of Medication: dabigatran (PRADAXA) 150 MG CAPS capsule   2. How are you currently taking this medication (dosage and times per day)? Take 1 capsule (150 mg total) by mouth 2 (two) times daily.   3. Are you having a reaction (difficulty breathing--STAT)? No  4. What is your medication issue? Pharmacy calling to state that medication is needing a Prior Auth. Please advise

## 2023-05-19 ENCOUNTER — Other Ambulatory Visit (HOSPITAL_COMMUNITY): Payer: Self-pay

## 2023-05-19 ENCOUNTER — Telehealth: Payer: Self-pay | Admitting: Pharmacy Technician

## 2023-05-19 NOTE — Telephone Encounter (Signed)
PA request has been Submitted. New Encounter created for follow up. For additional info see Pharmacy Prior Auth telephone encounter from 05/19/2023.

## 2023-05-19 NOTE — Telephone Encounter (Signed)
Pharmacy Patient Advocate Encounter   Received notification from Pt Calls Messages that prior authorization for Dabigatran Etexilate Mesylate 150MG  capsules is required/requested.   Insurance verification completed.   The patient is insured through [facility] .   Per test claim: PA required; PA submitted to [facility] via CoverMyMeds Key/confirmation #/EOC [id number] Status is pending

## 2023-05-31 NOTE — Telephone Encounter (Signed)
Call to patient to verify she was able to pick up her dabigatran. No answer, left detailed message per DPR asking to call our office or notify us on MyChart that she was able to pick up her medication.

## 2023-05-31 NOTE — Telephone Encounter (Signed)
Pharmacy Patient Advocate Encounter  Received notification from [facility] that Prior Authorization for DABIGATRAN CAP 150 MG has been DENIED. Please advise how you'd like to proceed. Full denial letter will be uploaded to the media tab. See denial reason below.  NON FORM, PLAN PREFERRED IS XARELTO.

## 2023-05-31 NOTE — Telephone Encounter (Signed)
Prescription needs to be run under GoodRx coupon codes, not her insurance.  Looks like she got this sorted out a few weeks ago based on other encounter.

## 2023-06-02 DIAGNOSIS — R809 Proteinuria, unspecified: Secondary | ICD-10-CM | POA: Diagnosis not present

## 2023-06-02 DIAGNOSIS — N183 Chronic kidney disease, stage 3 unspecified: Secondary | ICD-10-CM | POA: Diagnosis not present

## 2023-06-02 DIAGNOSIS — D631 Anemia in chronic kidney disease: Secondary | ICD-10-CM | POA: Diagnosis not present

## 2023-06-02 DIAGNOSIS — N2581 Secondary hyperparathyroidism of renal origin: Secondary | ICD-10-CM | POA: Diagnosis not present

## 2023-06-02 DIAGNOSIS — E1122 Type 2 diabetes mellitus with diabetic chronic kidney disease: Secondary | ICD-10-CM | POA: Diagnosis not present

## 2023-06-02 DIAGNOSIS — I129 Hypertensive chronic kidney disease with stage 1 through stage 4 chronic kidney disease, or unspecified chronic kidney disease: Secondary | ICD-10-CM | POA: Diagnosis not present

## 2023-06-07 ENCOUNTER — Ambulatory Visit: Payer: Medicare Other

## 2023-06-07 ENCOUNTER — Encounter: Payer: Self-pay | Admitting: Podiatry

## 2023-06-07 ENCOUNTER — Ambulatory Visit: Payer: Medicare Other | Admitting: Podiatry

## 2023-06-07 DIAGNOSIS — M79675 Pain in left toe(s): Secondary | ICD-10-CM

## 2023-06-07 DIAGNOSIS — M79674 Pain in right toe(s): Secondary | ICD-10-CM

## 2023-06-07 DIAGNOSIS — B351 Tinea unguium: Secondary | ICD-10-CM | POA: Diagnosis not present

## 2023-06-07 NOTE — Progress Notes (Signed)
   Chief Complaint  Patient presents with   Nail Problem    5th toe/nail right - toenail thick, feels like the nail is split, causing pain to the toe, last A1c was 7.4   New Patient (Initial Visit)    SUBJECTIVE Patient presents to office today complaining of elongated, thickened nails that cause pain while ambulating in shoes.  Patient is unable to trim their own nails. Patient is here for further evaluation and treatment.  Past Medical History:  Diagnosis Date   Anxiety    Arthritis    lower back   Asthma    was told she cough variant asthma   Atrial fibrillation (HCC) 01/2020   Cancer (HCC) 2016   Carpal tunnel syndrome, bilateral    Cluster headache    Depression    Diabetes mellitus without complication (HCC)    type 2   Endometrial polyp    GERD (gastroesophageal reflux disease)    Herniated disc, cervical    History of hiatal hernia    History of kidney stones    Hypertension    Kidney disease    Dr. Ella Jubilee- CKA (Stage III)   Morbid obesity (HCC)    Osteoarthritis of both knees    Peripheral edema    Pneumonia    Shortness of breath dyspnea    on exertion   Shoulder impingement    Bilateral   Sleep apnea    CPAP    Allergies  Allergen Reactions   Levaquin [Levofloxacin] Hives and Shortness Of Breath    Pt was on levaquin & vancomycin at same time & doesn't know which one caused the reaction   Vancomycin Hives and Shortness Of Breath    Pt was levaquin & vancomycin at the same & doesn't know which one caused the reaction.     Metformin     Other Reaction(s): GI Intolerance   Victoza [Liraglutide] Nausea Only   Tape Rash     OBJECTIVE General Patient is awake, alert, and oriented x 3 and in no acute distress. Derm Skin is dry and supple bilateral. Negative open lesions or macerations. Remaining integument unremarkable. Nails are tender, long, thickened and dystrophic with subungual debris, consistent with onychomycosis, 1-5 bilateral. No signs of  infection noted. Vasc  DP and PT pedal pulses palpable bilaterally. Temperature gradient within normal limits.  Neuro Epicritic and protective threshold sensation grossly intact bilaterally.  Musculoskeletal Exam No symptomatic pedal deformities noted bilateral. Muscular strength within normal limits.  ASSESSMENT 1.  Pain due to onychomycosis of toenails both  PLAN OF CARE 1. Patient evaluated today.  2. Instructed to maintain good pedal hygiene and foot care.  3. Mechanical debridement of nails 1-5 bilaterally performed using a nail nipper. Filed with dremel without incident.  4. Return to clinic in 3 mos.    Felecia Shelling, DPM [facility]  Dr. Felecia Shelling, DPM    [address] [place], [place] [address]                Office [phone]  Fax [phone]

## 2023-08-11 ENCOUNTER — Ambulatory Visit (INDEPENDENT_AMBULATORY_CARE_PROVIDER_SITE_OTHER): Payer: Medicare Other | Admitting: Neurology

## 2023-08-11 ENCOUNTER — Encounter: Payer: Self-pay | Admitting: Neurology

## 2023-08-11 VITALS — BP 128/65 | HR 72 | Ht 66.0 in | Wt >= 6400 oz

## 2023-08-11 DIAGNOSIS — G250 Essential tremor: Secondary | ICD-10-CM

## 2023-08-11 NOTE — Patient Instructions (Signed)
You have a mild tremor of both hands, given that your mother had a tremor, you may have what we call essential tremor.  I do not see any signs or symptoms of parkinson's like disease or what we call parkinsonism.  For your tremor, I would not recommend any new medications at this time. You had side effects with primidone. You are already on a beta blocker.  Please remember, that any kind of tremor may be exacerbated by anxiety, anger, nervousness, excitement, dehydration, sleep deprivation, thyroid dysfunction, by caffeine, and low blood sugar values or blood sugar fluctuations. Some medications can exacerbate tremors, this includes certain asthma or COPD medications and certain antidepressants (including SSRI type medications, such as citalopram). I recommend you follow up with your PCP and other providers.  Continue to work on better blood sugar control, weight reduction, good hydration with water, limit caffeine to 1-2 servings per day and try to get enough rest.

## 2023-08-11 NOTE — Progress Notes (Signed)
Subjective:    Patient ID: Holly [name] is a 57 y.o. female.  HPI    Huston Foley, MD, PhD [facility] [address] [address] [place], [place] [address]  Dear Dr. Tiburcio Pea,  I saw your patient, Holly [name] upon your kind request, in my neurologic clinic today for initial consultation of her tremors.  The patient is accompanied by her sister today.  As you know, Holly [name] is a 57 year old female with an underlying complex medical history of allergic rhinitis, overactive bladder, paroxysmal atrial fibrillation, venous insufficiency with lower extremity edema, reflux disease, cervical disc disease, obstructive sleep apnea (on PAP therapy), vitamin B12 deficiency, osteoarthritis, headaches, history of kidney stone, hypertension, hyperlipidemia, chronic kidney disease, and morbid obesity with a BMI of over 60, who reports a several year history of hand tremors, first noticed in 2017 by her.  I reviewed your office note from 05/12/2023.  She reportedly tried primidone in the past which caused knee pain.  She is no longer on primidone.  Of note, she is on multiple medications including a beta-blocker currently, mainly carvedilol twice daily.  She is also on Topamax.  She had blood work through your office on 05/12/2023 including TSH which was 2.38, she also had a lipid panel which showed benign results, LDL 79, total cholesterol 956, triglycerides 160.  A1c was elevated at 10.1, CMP with a blood sugar of 213, otherwise benign findings including BUN of 13 and creatinine 1.0.  Alk phos was 71, AST 28, ALT 37.  CBC with differential and platelets showed mildly elevated WBC at 11.7, MCH below normal at 25.3, MCHC below normal at 31. She reports having limited mobility.  She walks with a cane at home but maneuvers from the lift chair to the kitchen or to the bathroom, she can cover shorter distances but whenever she leaves the house she is in a wheelchair.  She is currently  in a wheelchair and could not stand up for weight taking.  Her estimated and patient reported weight was 400 pounds today.  She reports that her mom had a hand tremor.  She cannot think of any other family members with tremor, sister does not have a tremor.  The patient tries to hydrate well with water, estimates that she drinks about 60 ounces of water per day.  She limits her caffeine, she usually drinks 1 to 2 cups of coffee per day.  She drinks alcohol infrequently.  She uses her CPAP machine regularly but has some trouble maintaining sleep.  Of note, she is on citalopram as well.  Her Past Medical History Is Significant For: Past Medical History:  Diagnosis Date   Anxiety    Arthritis    lower back   Asthma    was told she cough variant asthma   Atrial fibrillation (HCC) 01/2020   Cancer (HCC) 2016   Carpal tunnel syndrome, bilateral    Cluster headache    Depression    Diabetes mellitus without complication (HCC)    type 2   Endometrial polyp    GERD (gastroesophageal reflux disease)    Herniated disc, cervical    History of hiatal hernia    History of kidney stones    Hypertension    Kidney disease    Dr. Ella Jubilee- [facility]A (Stage III)   Morbid obesity (HCC)    Osteoarthritis of both knees    Peripheral edema    Pneumonia    Shortness of breath dyspnea  on exertion   Shoulder impingement    Bilateral   Sleep apnea    CPAP    Her Past Surgical History Is Significant For: Past Surgical History:  Procedure Laterality Date   ANKLE ARTHROSCOPY Right    bone spurs   CARDIOVERSION N/A 01/19/2020   Procedure: CARDIOVERSION;  Surgeon: Pricilla Riffle, MD;  Location: [facility] ENDOSCOPY;  Service: Cardiovascular;  Laterality: N/A;   CYSTOSCOPY WITH RETROGRADE PYELOGRAM, URETEROSCOPY AND STENT PLACEMENT Bilateral 03/26/2020   Procedure: CYSTOSCOPY WITH RETROGRADE PYELOGRAM, URETEROSCOPY AND STENT PLACEMENT;  Surgeon: Noel Christmas, MD;  Location: [facility] ORS;  Service: Urology;  Laterality:  Bilateral;  90 MINS   CYSTOSCOPY WITH RETROGRADE PYELOGRAM, URETEROSCOPY AND STENT PLACEMENT Left 04/16/2020   Procedure: CYSTOSCOPY WITH RETROGRADE PYELOGRAM, URETEROSCOPY AND STENT PLACEMENT;  Surgeon: Noel Christmas, MD;  Location: [facility] ORS;  Service: Urology;  Laterality: Left;  90 MINS   FOOT NEUROMA SURGERY Left    HOLMIUM LASER APPLICATION Bilateral 03/26/2020   Procedure: HOLMIUM LASER APPLICATION;  Surgeon: Noel Christmas, MD;  Location: [facility] ORS;  Service: Urology;  Laterality: Bilateral;   HOLMIUM LASER APPLICATION Left 04/16/2020   Procedure: HOLMIUM LASER APPLICATION;  Surgeon: Noel Christmas, MD;  Location: [facility] ORS;  Service: Urology;  Laterality: Left;   HYSTEROSCOPY WITH D & C N/A 06/25/2015   Procedure: DILATATION AND CURETTAGE /HYSTEROSCOPY;  Surgeon: Olivia Mackie, MD;  Location: [facility] ORS;  Service: Gynecology;  Laterality: N/A;   IRRIGATION AND DEBRIDEMENT KNEE Left    for staph infection x2   KNEE ARTHROSCOPY Left    TEAR DUCT PROBING Right    TEE WITHOUT CARDIOVERSION N/A 01/19/2020   Procedure: TRANSESOPHAGEAL ECHOCARDIOGRAM (TEE);  Surgeon: Pricilla Riffle, MD;  Location: [facility] ENDOSCOPY;  Service: Cardiovascular;  Laterality: N/A;    Her Family History Is Significant For: Family History  Problem Relation Age of Onset   Pneumonia Mother    Atrial fibrillation Mother    Emphysema Father        smoked   Lung cancer Father    Rheum arthritis Sister     Her Social History Is Significant For: Social History   Socioeconomic History   Marital status: Single    Spouse name: Not on file   Number of children: Not on file   Years of education: Not on file   Highest education level: Not on file  Occupational History   Not on file  Tobacco Use   Smoking status: Never   Smokeless tobacco: Never  Vaping Use   Vaping status: Never Used  Substance and Sexual Activity   Alcohol use: Not Currently   Drug use: No   Sexual activity: Not on file  Other Topics Concern   Not  on file  Social History Narrative   Caffiene 1-2 cups daily.   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  Metformin     Other Reaction(s): GI Intolerance   Victoza [Liraglutide] Nausea Only   Tape Rash  :   Her Current Medications Are:  Outpatient Encounter Medications as of 08/11/2023  Medication Sig   Ascorbic Acid (VITAMIN C) 1000 MG tablet Take 1,000 mg by mouth in the morning and at bedtime.    atorvastatin (LIPITOR) 10 MG tablet Take 10 mg by mouth at bedtime.   BD VEO INSULIN SYRINGE U/F 31G X 15/64" 1 ML MISC USE SYRINGE TO INJECT INSULIN 3 TIMES A DAY SUBCUTANEOUS FOR 30 DAYS   calcium carbonate (OS-CAL) 600 MG TABS tablet Take 600 mg by mouth 2 (two) times daily with a meal.   carvedilol (COREG) 3.125 MG tablet TAKE 1 TABLET BY MOUTH TWICE A DAY WITH FOOD   Cholecalciferol (VITAMIN D) 50 MCG (2000 UT) tablet Take 2,000 Units by mouth in the morning and at bedtime.    citalopram (CELEXA) 40 MG tablet Take 40 mg by mouth every evening.    cyanocobalamin (,VITAMIN B-12,) 1000 MCG/ML injection Inject 1,000 mcg into the muscle every 30 (thirty) days.   dabigatran (PRADAXA) 150 MG CAPS capsule Take 1 capsule (150 mg total) by mouth 2 (two) times daily.   ferrous sulfate 325 (65 FE) MG tablet Take 325 mg by mouth daily with breakfast.   fluticasone (FLONASE) 50 MCG/ACT nasal spray Place 2 sprays into both nostrils daily as needed for allergies or  rhinitis.    HUMALOG MIX 75/25 KWIKPEN (75-25) 100 UNIT/ML KwikPen 240u in the amj 230u in the pm   HYDROcodone-acetaminophen (NORCO) 10-325 MG tablet Take 1 tablet by mouth every 6 (six) hours as needed (Pain).    hydrOXYzine (ATARAX) 25 MG tablet Take 25 mg by mouth at bedtime as needed for itching.   KERENDIA 10 MG TABS Take 1 tablet by mouth daily.   Lancets (ONETOUCH DELICA PLUS LANCET33G) MISC    levocetirizine (XYZAL) 5 MG tablet Take 5 mg by mouth at bedtime.   losartan (COZAAR) 25 MG tablet Take 1 tablet (25 mg total) by mouth daily.   metFORMIN (GLUCOPHAGE-XR) 500 MG 24 hr tablet Take 500 mg by mouth daily.    montelukast (SINGULAIR) 10 MG tablet Take 10 mg by mouth daily.    norethindrone (AYGESTIN) 5 MG tablet Take 20 mg by mouth daily.    ONETOUCH VERIO test strip    pantoprazole (PROTONIX) 40 MG tablet Take 40 mg by mouth daily.   Propylene Glycol-Glycerin (SOOTHE) 0.6-0.6 % SOLN Place 2 drops into both eyes daily as needed (Dry eye). Sooth   solifenacin (VESICARE) 10 MG tablet Take 10 mg by mouth daily.   topiramate (TOPAMAX) 100 MG tablet Take 150 mg by mouth every evening.    vitamin B-12 (CYANOCOBALAMIN) 1000 MCG tablet Take 1,000 mcg by mouth daily.   No facility-administered encounter medications on file as of 08/11/2023.  :   Review of Systems:  Out of a complete 14 point review of systems, all are reviewed and negative with the exception of these symptoms as listed below:  Review of Systems  Neurological:        Tremor both hands since 10/2015.  L > R.  R dominant. Mother had tremors, not diagnosed.  Does use Cpap Dr. Armanda Magic.     Objective:  Neurological Exam  Physical Exam Physical Examination:   Vitals:   08/11/23 1430  BP: 128/65  Pulse: 72    General Examination: The patient is a very pleasant  57 y.o. female in no acute distress. She is situated in her wheelchair.    HEENT: Normocephalic, atraumatic, pupils are equal, round and reactive to  light, corrective eyeglasses in place.  Extraocular tracking is good without limitation to gaze excursion or nystagmus noted. Hearing is grossly intact. Face is symmetric with normal facial animation. Speech is clear with no dysarthria noted. There is no hypophonia. There is an intermittent mild side-to-side head tremor.  Neck is supple, no carotid bruits.  Airway examination reveals moderate mouth dryness, moderate airway crowding.  Tongue protrudes centrally and palate elevates symmetrically.    Chest: Clear to auscultation without wheezing, rhonchi or crackles noted.  Heart: S1+S2+0, regular and normal without murmurs, rubs or gallops noted.   Abdomen: Soft, non-tender and non-distended.  Extremities: There is severe lower extremity edema with patchy and chronic looking discoloration, erythematous patches, dry and scaly patches.   Skin: Warm and dry without trophic changes noted.   Musculoskeletal: exam reveals no obvious joint deformities.   Neurologically:  Mental status: The patient is awake, alert and oriented in all 4 spheres. Her immediate and remote memory, attention, language skills and fund of knowledge are appropriate. There is no evidence of aphasia, agnosia, apraxia or anomia. Speech is clear with normal prosody and enunciation. Thought process is linear. Mood is normal and affect is normal.  Cranial nerves II - XII are as described above under HEENT exam.  Motor exam: Normal bulk, strength and tone is noted. There is no obvious resting tremor.  She has a mild bilateral upper extremity postural tremor, no lower extremity tremor noted.  She has no significant action tremor, no intention tremor.   On 08/11/2023: On Archimedes spiral drawing she has minimal trembling with the left hand, mild insecurity but no significant trembling with the right hand, handwriting is slightly tremulous, legible, cursive, not micrographic.   Fine motor skills and coordination: grossly intact with finger  taps, hand movements and rapid alternating patting in both upper extremities, mildly impaired foot taps bilaterally, likely secondary to body habitus and significant swelling in the legs.  Cerebellar testing: No dysmetria or intention tremor. There is no truncal or gait ataxia.  Sensory exam: intact to light touch in the upper and lower extremities.  Gait, station and balance: She is in a wheelchair.  She did not bring a walker or cane.  I did not have her stand or walk for me today.  Assessment and Plan:   In summary, Holly [name] is a very pleasant 57 y.o.-year old female with an underlying complex medical history of allergic rhinitis, overactive bladder, paroxysmal atrial fibrillation, venous insufficiency with lower extremity edema, reflux disease, cervical disc disease, obstructive sleep apnea (on PAP therapy), vitamin B12 deficiency, osteoarthritis, headaches, history of kidney stone, hypertension, hyperlipidemia, chronic kidney disease, and morbid obesity with a BMI of over 60, who presents for evaluation of her several year history of hand tremors.  History, family history and examination are supportive of a mild form of essential tremor.  She has a slight head side-to-side tremor and mild hand tremor bilaterally.  No evidence of parkinsonism.  She is limited in her mobility.  She is already on a beta-blocker and I do not recommend adding a second beta-blocker.  We talked about limited options for symptomatic treatment for tremors today.  I explained to the patient that she already tried primidone and had side effects.  She is on Topamax which sometimes helps as an adjunct for tremors, she is  advised to talk to you about continuing the Topamax however as she has a history of kidney stone.  Topamax can increase the risk for kidney stones.  We talked about alleviating factors and exacerbating factors for tremors, it is also possible that Celexa is a contributor to her tremors as certain  antidepressant medications can exacerbate tremors.  It may not be the culprit.  She is recommended to follow-up with you as scheduled and her other providers, she is strongly encouraged to continue to work on weight loss and blood sugar control.  She is advised to stay well-hydrated with water and well rested and continue with her PAP machine regularly.  She is encouraged to limit her caffeine and avoid alcohol.  Unfortunately, there is not a whole lot I can add at this time.  I answered all the questions today and the patient and her sister were in agreement.  Thank you very much for allowing me to participate in the care of this nice patient. If I can be of any further assistance to you please do not hesitate to call me at [phone].  Sincerely,   Huston Foley, MD, PhD

## 2023-11-18 DIAGNOSIS — E7800 Pure hypercholesterolemia, unspecified: Secondary | ICD-10-CM | POA: Diagnosis not present

## 2023-11-18 DIAGNOSIS — N1832 Chronic kidney disease, stage 3b: Secondary | ICD-10-CM | POA: Diagnosis not present

## 2023-11-18 DIAGNOSIS — E1122 Type 2 diabetes mellitus with diabetic chronic kidney disease: Secondary | ICD-10-CM | POA: Diagnosis not present

## 2023-11-18 DIAGNOSIS — I480 Paroxysmal atrial fibrillation: Secondary | ICD-10-CM | POA: Diagnosis not present

## 2023-11-18 DIAGNOSIS — E1165 Type 2 diabetes mellitus with hyperglycemia: Secondary | ICD-10-CM | POA: Diagnosis not present

## 2023-11-18 DIAGNOSIS — R251 Tremor, unspecified: Secondary | ICD-10-CM | POA: Diagnosis not present

## 2023-11-18 DIAGNOSIS — I10 Essential (primary) hypertension: Secondary | ICD-10-CM | POA: Diagnosis not present

## 2023-11-18 DIAGNOSIS — N3281 Overactive bladder: Secondary | ICD-10-CM | POA: Diagnosis not present

## 2023-11-18 DIAGNOSIS — N2581 Secondary hyperparathyroidism of renal origin: Secondary | ICD-10-CM | POA: Diagnosis not present

## 2023-11-18 DIAGNOSIS — M170 Bilateral primary osteoarthritis of knee: Secondary | ICD-10-CM | POA: Diagnosis not present

## 2023-11-18 DIAGNOSIS — K219 Gastro-esophageal reflux disease without esophagitis: Secondary | ICD-10-CM | POA: Diagnosis not present

## 2023-12-02 ENCOUNTER — Other Ambulatory Visit: Payer: Self-pay | Admitting: Cardiology

## 2023-12-10 DIAGNOSIS — J0110 Acute frontal sinusitis, unspecified: Secondary | ICD-10-CM | POA: Diagnosis not present

## 2024-01-18 ENCOUNTER — Encounter: Payer: Self-pay | Admitting: Cardiology

## 2024-01-18 DIAGNOSIS — R040 Epistaxis: Secondary | ICD-10-CM

## 2024-01-18 NOTE — Telephone Encounter (Signed)
 An urgent referral for ENT was ordered. Pt made aware via MyChart.

## 2024-01-19 MED ORDER — APIXABAN 5 MG PO TABS: 5.0000 mg | ORAL_TABLET | Freq: Two times a day (BID) | ORAL | 5 refills | Status: DC

## 2024-01-19 NOTE — Addendum Note (Signed)
 Addended by: Malena Peer D on: 01/19/2024 12:26 PM   Modules accepted: Orders

## 2024-05-09 ENCOUNTER — Other Ambulatory Visit: Payer: Self-pay | Admitting: Cardiology

## 2024-05-28 ENCOUNTER — Other Ambulatory Visit: Payer: Self-pay | Admitting: Cardiology

## 2024-06-26 ENCOUNTER — Other Ambulatory Visit: Payer: Self-pay | Admitting: Cardiology

## 2024-08-03 DIAGNOSIS — R519 Headache, unspecified: Secondary | ICD-10-CM | POA: Diagnosis not present

## 2024-08-03 DIAGNOSIS — J0110 Acute frontal sinusitis, unspecified: Secondary | ICD-10-CM | POA: Diagnosis not present

## 2024-08-06 ENCOUNTER — Other Ambulatory Visit: Payer: Self-pay | Admitting: Cardiology

## 2024-08-23 NOTE — Progress Notes (Signed)
 CEOLA PARA                                          MRN: [id number]   08/23/2024   The [facility] Quality Team Specialist reviewed this patient medical record for the purposes of chart review for care gap closure. The following were reviewed: chart review for care gap closure-glycemic status assessment.    [facility] Quality Team

## 2024-09-01 ENCOUNTER — Other Ambulatory Visit: Payer: Self-pay | Admitting: Cardiology

## 2024-09-07 ENCOUNTER — Other Ambulatory Visit: Payer: Self-pay | Admitting: Cardiology

## 2024-09-15 ENCOUNTER — Other Ambulatory Visit: Payer: Self-pay | Admitting: Cardiology

## 2024-09-18 NOTE — Telephone Encounter (Signed)
 Prescription refill request for Eliquis  received. Indication: PAF Last office visit: 04/08/23  T Turner (has appt 12/16/23) Scr: 1.17 on 06/02/23  Epic Age: 58 Weight: 182.3kg  Based on above findings Eliquis  5mg  twice daily is the appropriate dose.  Refill approved.

## 2024-10-25 NOTE — Progress Notes (Signed)
 Holly [name]                                          MRN: [id number]   10/25/2024   The [facility] Quality Team Specialist reviewed this patient medical record for the purposes of chart review for care gap closure. The following were reviewed: chart review for care gap closure-glycemic status assessment.    [facility] Quality Team

## 2024-11-08 ENCOUNTER — Ambulatory Visit: Admitting: Podiatry

## 2024-11-10 NOTE — Progress Notes (Signed)
 LEYDY WORTHEY                                          MRN: [id number]   11/10/2024   The [facility] Quality Team Specialist reviewed this patient medical record for the purposes of chart review for care gap closure. The following were reviewed: chart review for care gap closure-glycemic status assessment.    [facility] Quality Team

## 2024-11-22 ENCOUNTER — Ambulatory Visit: Admitting: Podiatry

## 2024-12-14 ENCOUNTER — Ambulatory Visit: Admitting: Cardiology

## 2025-03-21 ENCOUNTER — Ambulatory Visit: Admitting: Cardiology
